# Patient Record
Sex: Female | Born: 1950 | Race: Black or African American | Hispanic: No | Marital: Married | Smoking: Never smoker
Health system: Southern US, Community
[De-identification: ages and names within clinical notes are randomized; demographics above are authoritative.]

## PROBLEM LIST (undated history)

## (undated) DIAGNOSIS — E119 Type 2 diabetes mellitus without complications: Secondary | ICD-10-CM

## (undated) DIAGNOSIS — G4730 Sleep apnea, unspecified: Secondary | ICD-10-CM

## (undated) DIAGNOSIS — I2510 Atherosclerotic heart disease of native coronary artery without angina pectoris: Secondary | ICD-10-CM

## (undated) DIAGNOSIS — B009 Herpesviral infection, unspecified: Secondary | ICD-10-CM

## (undated) DIAGNOSIS — T7840XA Allergy, unspecified, initial encounter: Secondary | ICD-10-CM

## (undated) DIAGNOSIS — G4733 Obstructive sleep apnea (adult) (pediatric): Secondary | ICD-10-CM

## (undated) DIAGNOSIS — I10 Essential (primary) hypertension: Secondary | ICD-10-CM

## (undated) DIAGNOSIS — E785 Hyperlipidemia, unspecified: Secondary | ICD-10-CM

## (undated) DIAGNOSIS — F419 Anxiety disorder, unspecified: Secondary | ICD-10-CM

## (undated) DIAGNOSIS — K219 Gastro-esophageal reflux disease without esophagitis: Secondary | ICD-10-CM

## (undated) DIAGNOSIS — J309 Allergic rhinitis, unspecified: Secondary | ICD-10-CM

## (undated) HISTORY — DX: Allergy, unspecified, initial encounter: T78.40XA

## (undated) HISTORY — PX: ANGIOPLASTY: SHX39

## (undated) HISTORY — PX: CARDIAC CATHETERIZATION: SHX172

## (undated) HISTORY — PX: ABDOMINAL HYSTERECTOMY: SHX81

## (undated) HISTORY — PX: TUBAL LIGATION: SHX77

## (undated) HISTORY — DX: Obstructive sleep apnea (adult) (pediatric): G47.33

## (undated) HISTORY — DX: Gastro-esophageal reflux disease without esophagitis: K21.9

## (undated) HISTORY — DX: Anxiety disorder, unspecified: F41.9

## (undated) HISTORY — DX: Herpesviral infection, unspecified: B00.9

## (undated) HISTORY — DX: Allergic rhinitis, unspecified: J30.9

## (undated) HISTORY — DX: Sleep apnea, unspecified: G47.30

## (undated) HISTORY — DX: Hyperlipidemia, unspecified: E78.5

## (undated) HISTORY — DX: Atherosclerotic heart disease of native coronary artery without angina pectoris: I25.10

---

## 1997-04-05 ENCOUNTER — Ambulatory Visit (HOSPITAL_COMMUNITY): Admission: RE | Admit: 1997-04-05 | Discharge: 1997-04-05 | Payer: Self-pay

## 1998-04-17 ENCOUNTER — Ambulatory Visit (HOSPITAL_COMMUNITY): Admission: RE | Admit: 1998-04-17 | Discharge: 1998-04-17 | Payer: Self-pay | Admitting: Internal Medicine

## 1998-04-17 ENCOUNTER — Encounter: Payer: Self-pay | Admitting: Internal Medicine

## 1998-06-28 ENCOUNTER — Ambulatory Visit (HOSPITAL_COMMUNITY): Admission: RE | Admit: 1998-06-28 | Discharge: 1998-06-28 | Payer: Self-pay | Admitting: Internal Medicine

## 1998-08-02 ENCOUNTER — Encounter: Payer: Self-pay | Admitting: Cardiology

## 1998-08-02 ENCOUNTER — Ambulatory Visit (HOSPITAL_COMMUNITY): Admission: RE | Admit: 1998-08-02 | Discharge: 1998-08-02 | Payer: Self-pay | Admitting: Cardiology

## 1999-04-22 ENCOUNTER — Ambulatory Visit (HOSPITAL_COMMUNITY): Admission: RE | Admit: 1999-04-22 | Discharge: 1999-04-22 | Payer: Self-pay | Admitting: Internal Medicine

## 1999-04-22 ENCOUNTER — Encounter: Payer: Self-pay | Admitting: Internal Medicine

## 1999-06-10 ENCOUNTER — Encounter: Admission: RE | Admit: 1999-06-10 | Discharge: 1999-07-16 | Payer: Self-pay | Admitting: Internal Medicine

## 1999-10-03 ENCOUNTER — Encounter (INDEPENDENT_AMBULATORY_CARE_PROVIDER_SITE_OTHER): Payer: Self-pay | Admitting: *Deleted

## 1999-10-03 ENCOUNTER — Ambulatory Visit (HOSPITAL_BASED_OUTPATIENT_CLINIC_OR_DEPARTMENT_OTHER): Admission: RE | Admit: 1999-10-03 | Discharge: 1999-10-03 | Payer: Self-pay | Admitting: General Surgery

## 2000-01-05 ENCOUNTER — Encounter: Admission: RE | Admit: 2000-01-05 | Discharge: 2000-01-05 | Payer: Self-pay | Admitting: Internal Medicine

## 2000-01-05 ENCOUNTER — Encounter: Payer: Self-pay | Admitting: Internal Medicine

## 2000-07-05 ENCOUNTER — Ambulatory Visit (HOSPITAL_COMMUNITY): Admission: RE | Admit: 2000-07-05 | Discharge: 2000-07-05 | Payer: Self-pay | Admitting: Internal Medicine

## 2000-07-05 ENCOUNTER — Encounter: Payer: Self-pay | Admitting: Internal Medicine

## 2001-07-14 ENCOUNTER — Ambulatory Visit (HOSPITAL_COMMUNITY): Admission: RE | Admit: 2001-07-14 | Discharge: 2001-07-14 | Payer: Self-pay | Admitting: Internal Medicine

## 2001-07-14 ENCOUNTER — Encounter: Payer: Self-pay | Admitting: Internal Medicine

## 2002-09-08 ENCOUNTER — Ambulatory Visit (HOSPITAL_COMMUNITY): Admission: RE | Admit: 2002-09-08 | Discharge: 2002-09-08 | Payer: Self-pay | Admitting: Internal Medicine

## 2002-09-08 ENCOUNTER — Encounter: Payer: Self-pay | Admitting: Internal Medicine

## 2002-10-19 ENCOUNTER — Ambulatory Visit (HOSPITAL_COMMUNITY): Admission: RE | Admit: 2002-10-19 | Discharge: 2002-10-19 | Payer: Self-pay | Admitting: Internal Medicine

## 2002-11-03 ENCOUNTER — Encounter: Admission: RE | Admit: 2002-11-03 | Discharge: 2002-11-03 | Payer: Self-pay | Admitting: Internal Medicine

## 2002-11-03 ENCOUNTER — Encounter: Payer: Self-pay | Admitting: Internal Medicine

## 2002-11-03 ENCOUNTER — Ambulatory Visit (HOSPITAL_COMMUNITY): Admission: RE | Admit: 2002-11-03 | Discharge: 2002-11-03 | Payer: Self-pay | Admitting: Internal Medicine

## 2002-12-07 ENCOUNTER — Encounter: Admission: RE | Admit: 2002-12-07 | Discharge: 2003-03-07 | Payer: Self-pay | Admitting: Internal Medicine

## 2003-07-20 ENCOUNTER — Emergency Department (HOSPITAL_COMMUNITY): Admission: EM | Admit: 2003-07-20 | Discharge: 2003-07-20 | Payer: Self-pay | Admitting: Emergency Medicine

## 2003-07-21 ENCOUNTER — Ambulatory Visit (HOSPITAL_COMMUNITY): Admission: RE | Admit: 2003-07-21 | Discharge: 2003-07-21 | Payer: Self-pay | Admitting: Emergency Medicine

## 2003-07-23 ENCOUNTER — Ambulatory Visit (HOSPITAL_COMMUNITY): Admission: RE | Admit: 2003-07-23 | Discharge: 2003-07-23 | Payer: Self-pay | Admitting: Emergency Medicine

## 2003-08-02 ENCOUNTER — Encounter: Admission: RE | Admit: 2003-08-02 | Discharge: 2003-10-31 | Payer: Self-pay | Admitting: Internal Medicine

## 2003-09-07 ENCOUNTER — Inpatient Hospital Stay (HOSPITAL_COMMUNITY): Admission: EM | Admit: 2003-09-07 | Discharge: 2003-09-10 | Payer: Self-pay | Admitting: Emergency Medicine

## 2003-09-23 ENCOUNTER — Emergency Department (HOSPITAL_COMMUNITY): Admission: EM | Admit: 2003-09-23 | Discharge: 2003-09-24 | Payer: Self-pay | Admitting: *Deleted

## 2003-10-30 ENCOUNTER — Ambulatory Visit (HOSPITAL_COMMUNITY): Admission: RE | Admit: 2003-10-30 | Discharge: 2003-10-30 | Payer: Self-pay | Admitting: Internal Medicine

## 2004-03-26 ENCOUNTER — Encounter: Admission: RE | Admit: 2004-03-26 | Discharge: 2004-03-26 | Payer: Self-pay | Admitting: Gastroenterology

## 2004-09-26 ENCOUNTER — Other Ambulatory Visit: Admission: RE | Admit: 2004-09-26 | Discharge: 2004-09-26 | Payer: Self-pay | Admitting: Internal Medicine

## 2004-11-10 ENCOUNTER — Ambulatory Visit (HOSPITAL_COMMUNITY): Admission: RE | Admit: 2004-11-10 | Discharge: 2004-11-10 | Payer: Self-pay | Admitting: Internal Medicine

## 2005-06-24 ENCOUNTER — Encounter: Admission: RE | Admit: 2005-06-24 | Discharge: 2005-06-24 | Payer: Self-pay | Admitting: Cardiology

## 2005-06-25 ENCOUNTER — Ambulatory Visit (HOSPITAL_COMMUNITY): Admission: RE | Admit: 2005-06-25 | Discharge: 2005-06-25 | Payer: Self-pay | Admitting: Cardiology

## 2005-11-11 ENCOUNTER — Ambulatory Visit (HOSPITAL_COMMUNITY): Admission: RE | Admit: 2005-11-11 | Discharge: 2005-11-11 | Payer: Self-pay | Admitting: Internal Medicine

## 2006-06-04 ENCOUNTER — Emergency Department (HOSPITAL_COMMUNITY): Admission: EM | Admit: 2006-06-04 | Discharge: 2006-06-04 | Payer: Self-pay | Admitting: Family Medicine

## 2006-12-30 ENCOUNTER — Ambulatory Visit (HOSPITAL_COMMUNITY): Admission: RE | Admit: 2006-12-30 | Discharge: 2006-12-30 | Payer: Self-pay | Admitting: *Deleted

## 2007-05-04 ENCOUNTER — Emergency Department (HOSPITAL_COMMUNITY): Admission: EM | Admit: 2007-05-04 | Discharge: 2007-05-04 | Payer: Self-pay | Admitting: Emergency Medicine

## 2007-12-20 ENCOUNTER — Ambulatory Visit: Payer: Self-pay | Admitting: Physical Medicine & Rehabilitation

## 2008-01-04 ENCOUNTER — Ambulatory Visit (HOSPITAL_COMMUNITY): Admission: RE | Admit: 2008-01-04 | Discharge: 2008-01-04 | Payer: Self-pay | Admitting: Internal Medicine

## 2009-01-07 ENCOUNTER — Ambulatory Visit (HOSPITAL_COMMUNITY): Admission: RE | Admit: 2009-01-07 | Discharge: 2009-01-07 | Payer: Self-pay | Admitting: Internal Medicine

## 2010-01-08 ENCOUNTER — Ambulatory Visit (HOSPITAL_COMMUNITY)
Admission: RE | Admit: 2010-01-08 | Discharge: 2010-01-08 | Payer: Self-pay | Source: Home / Self Care | Attending: *Deleted | Admitting: *Deleted

## 2010-06-27 NOTE — Cardiovascular Report (Signed)
NAME:  Victoria Jimenez, Victoria Jimenez NO.:  [id number]   MEDICAL RECORD NO.:  [id number]          PATIENT TYPE:  OIB   LOCATION:  2899                         FACILITY:  [facility]   PHYSICIAN:  Madaline Savage, M.D.DATE OF BIRTH:  1950/06/19   DATE OF PROCEDURE:  06/25/2005  DATE OF DISCHARGE:                              CARDIAC CATHETERIZATION   PROCEDURES PERFORMED:  1.  Outpatient cardiac catheterization consisting of:      1.  Selective coronary angiography by Judkins technique.      2.  Retrograde left heart catheterization.      3.  Left ventricular angiography.  2.  Non-selective aortography just above the renal arteries.  3.  Successful right femoral percutaneous femoral AngioSeal closure.   COMPLICATIONS:  None.   ENTRY SITE:  Right femoral.   DYE USED:  Omnipaque.   PATIENT PROFILE:  The patient is a very pleasant 60 year old African-  American female who is a medical patient of Dr. Madison Hickman who was  recently referred to me for chest pain.  An outpatient Cardiolite stress  test showed anterior and apical wall reversible ischemia and showed a normal  left ventricular ejection fraction.  Today the patient enters the  catheterization laboratory electively for diagnostic cardiac catheterization  to further delineate the possible cause of chest pain in view of the  abnormal Cardiolite.  The patient gave her informed consent prior to the  date of this catheterization.   RESULTS:   PRESSURES:  The left ventricular pressure was 160/10, end-diastolic pressure  18.  Central aortic pressure 160/75, mean of 110.  No aortic valve gradient  by pullback technique.   ANGIOGRAPHIC RESULTS:  The patient's left main coronary artery was very  short and superiorly oriented.  LAD coursed to the cardiac apex and gave  rise to a single diagonal branch near the origin of the first septal  perforator branch.  Diagonal LAD and septal perforator branches were all  normal.   Left circumflex coronary artery was dominant giving rise to a posterior  descending branch in the lower two-thirds of the vessel.  The first and  second OM were normal.  The PDA was normal and the left circumflex coronary  artery was normal.   The right coronary artery was non-dominant and did damp somewhat with  intubation with the 6-French diagnostic catheter.  No lesions were seen  throughout the RCA.   Left ventricular angiogram showed normal contractility of all wall segments.  No mitral regurgitation was seen.  No apical thrombus was noted.  LV EF was  60%.   Because of the patient's history of hypertension abdominal aortography was  performed just above the level of the renal arteries.  Both renal arteries  were single and normal.  No evidence of renal artery stenosis was  identified.  In addition, I was able to see the proximal and mid superior  mesenteric artery which appeared normal and infrarenal abdominal aorta was  normal as were the common iliacs.   FINAL DIAGNOSIS:  1.  Recent episodes of chest pain superimposed on a greater than 10-year  history of chest and back pain.  2.  Morbid obesity.  3.  Hypertension.   FINAL IMPRESSIONS:  The patient has a history of somewhat atypical chest  pain.  Did have an abnormal Cardiolite stress test, but showed  angiographically patent coronary arteries at today's catheterization.  Her  Protonix dose is 40 mg daily.  We will increase it for a while to 40 mg  twice a day.  We will refer her back to the excellent care of Madison Hickman, M.D., her primary care physician and I am planning to counsel the  patient in regards to weight loss.  I believe that a lot of her back pain  arises from her weight and I think some of her chest pain probably does as  well.   FINAL IMPRESSIONS:  1.  Angiographically patent coronary arteries in a left circumflex dominant      system.  2.  Normal left ventricular ejection fraction.  3.  Normal  renal arteries.  4.  Normal abdominal aorta.           ______________________________  Madaline Savage, M.D.     WHG/MEDQ  D:  06/25/2005  T:  06/25/2005  Job:  [id number]   cc:   Darius Bump, M.D.  Fax: [phone]   Cath Lab   Medical Records

## 2010-06-29 ENCOUNTER — Inpatient Hospital Stay (INDEPENDENT_AMBULATORY_CARE_PROVIDER_SITE_OTHER)
Admission: RE | Admit: 2010-06-29 | Discharge: 2010-06-29 | Disposition: A | Discharge status: Home / Self Care | Payer: Medicaid Other | Source: Ambulatory Visit | Attending: Emergency Medicine | Admitting: Emergency Medicine

## 2010-06-29 DIAGNOSIS — M779 Enthesopathy, unspecified: Secondary | ICD-10-CM

## 2010-06-29 DIAGNOSIS — M7710 Lateral epicondylitis, unspecified elbow: Secondary | ICD-10-CM

## 2011-01-02 ENCOUNTER — Other Ambulatory Visit (HOSPITAL_COMMUNITY): Payer: Self-pay | Admitting: Family Medicine

## 2011-01-02 DIAGNOSIS — Z1231 Encounter for screening mammogram for malignant neoplasm of breast: Secondary | ICD-10-CM

## 2011-02-11 ENCOUNTER — Ambulatory Visit (HOSPITAL_COMMUNITY)
Admission: RE | Admit: 2011-02-11 | Discharge: 2011-02-11 | Disposition: A | Discharge status: Home / Self Care | Payer: Self-pay | Source: Ambulatory Visit | Attending: Family Medicine | Admitting: Family Medicine

## 2011-02-11 DIAGNOSIS — Z1231 Encounter for screening mammogram for malignant neoplasm of breast: Secondary | ICD-10-CM

## 2011-03-30 ENCOUNTER — Other Ambulatory Visit: Payer: Self-pay | Admitting: Oncology

## 2011-07-15 ENCOUNTER — Other Ambulatory Visit (HOSPITAL_COMMUNITY): Payer: Self-pay | Admitting: Cardiology

## 2011-07-15 DIAGNOSIS — R0602 Shortness of breath: Secondary | ICD-10-CM

## 2011-07-21 ENCOUNTER — Encounter (HOSPITAL_COMMUNITY)
Admission: RE | Admit: 2011-07-21 | Discharge: 2011-07-21 | Disposition: A | Discharge status: Home / Self Care | Payer: Self-pay | Source: Ambulatory Visit | Attending: Cardiology | Admitting: Cardiology

## 2011-07-21 ENCOUNTER — Other Ambulatory Visit (HOSPITAL_COMMUNITY): Payer: Self-pay

## 2011-07-21 ENCOUNTER — Ambulatory Visit (HOSPITAL_COMMUNITY)
Admission: RE | Admit: 2011-07-21 | Discharge: 2011-07-21 | Disposition: A | Discharge status: Home / Self Care | Payer: Self-pay | Source: Ambulatory Visit | Attending: Cardiology | Admitting: Cardiology

## 2011-07-21 DIAGNOSIS — E119 Type 2 diabetes mellitus without complications: Secondary | ICD-10-CM | POA: Insufficient documentation

## 2011-07-21 DIAGNOSIS — R0989 Other specified symptoms and signs involving the circulatory and respiratory systems: Secondary | ICD-10-CM | POA: Insufficient documentation

## 2011-07-21 DIAGNOSIS — R0602 Shortness of breath: Secondary | ICD-10-CM

## 2011-07-21 DIAGNOSIS — E785 Hyperlipidemia, unspecified: Secondary | ICD-10-CM | POA: Insufficient documentation

## 2011-07-21 DIAGNOSIS — R0609 Other forms of dyspnea: Secondary | ICD-10-CM | POA: Insufficient documentation

## 2011-07-21 DIAGNOSIS — I319 Disease of pericardium, unspecified: Secondary | ICD-10-CM | POA: Insufficient documentation

## 2011-07-21 DIAGNOSIS — M7989 Other specified soft tissue disorders: Secondary | ICD-10-CM | POA: Insufficient documentation

## 2011-07-21 DIAGNOSIS — I10 Essential (primary) hypertension: Secondary | ICD-10-CM | POA: Insufficient documentation

## 2011-07-21 MED ORDER — REGADENOSON 0.4 MG/5ML IV SOLN
INTRAVENOUS | Status: AC
  Administered 2011-07-21: 0.4 mg via INTRAVENOUS
  Filled 2011-07-21: qty 5

## 2011-07-21 MED ORDER — REGADENOSON 0.4 MG/5ML IV SOLN
0.4000 mg | Freq: Once | INTRAVENOUS | Status: AC
  Administered 2011-07-21: 0.4 mg via INTRAVENOUS

## 2011-07-21 NOTE — Progress Notes (Signed)
  Echocardiogram 2D Echocardiogram has been performed.  Cathie Beams Deneen 07/21/2011, 1:02 PM

## 2011-07-22 ENCOUNTER — Encounter (HOSPITAL_COMMUNITY)
Admission: RE | Admit: 2011-07-22 | Discharge: 2011-07-22 | Disposition: A | Discharge status: Home / Self Care | Payer: Self-pay | Source: Ambulatory Visit | Attending: Cardiology | Admitting: Cardiology

## 2011-07-22 DIAGNOSIS — R0602 Shortness of breath: Secondary | ICD-10-CM | POA: Insufficient documentation

## 2011-07-22 MED ORDER — TECHNETIUM TC 99M TETROFOSMIN IV KIT
30.0000 | PACK | Freq: Once | INTRAVENOUS | Status: AC | PRN
  Administered 2011-07-22: 30 via INTRAVENOUS

## 2011-07-22 MED ORDER — TECHNETIUM TC 99M TETROFOSMIN IV KIT: 30.0000 | PACK | Freq: Once | INTRAVENOUS | Status: AC | PRN

## 2011-08-13 ENCOUNTER — Other Ambulatory Visit: Payer: Self-pay | Admitting: Oncology

## 2011-08-24 ENCOUNTER — Ambulatory Visit (HOSPITAL_COMMUNITY)
Admission: RE | Admit: 2011-08-24 | Discharge: 2011-08-24 | Disposition: A | Discharge status: Home / Self Care | Payer: Self-pay | Source: Ambulatory Visit | Attending: Family Medicine | Admitting: Family Medicine

## 2011-08-24 ENCOUNTER — Other Ambulatory Visit (HOSPITAL_COMMUNITY): Payer: Self-pay | Admitting: Family Medicine

## 2011-08-24 DIAGNOSIS — M5137 Other intervertebral disc degeneration, lumbosacral region: Secondary | ICD-10-CM | POA: Insufficient documentation

## 2011-08-24 DIAGNOSIS — M545 Low back pain, unspecified: Secondary | ICD-10-CM | POA: Insufficient documentation

## 2011-08-24 DIAGNOSIS — R52 Pain, unspecified: Secondary | ICD-10-CM | POA: Insufficient documentation

## 2011-08-24 DIAGNOSIS — M51379 Other intervertebral disc degeneration, lumbosacral region without mention of lumbar back pain or lower extremity pain: Secondary | ICD-10-CM | POA: Insufficient documentation

## 2012-01-06 ENCOUNTER — Other Ambulatory Visit (HOSPITAL_COMMUNITY): Payer: Self-pay | Admitting: Family Medicine

## 2012-01-11 ENCOUNTER — Encounter (HOSPITAL_COMMUNITY): Payer: Self-pay | Admitting: Cardiology

## 2012-01-11 ENCOUNTER — Emergency Department (HOSPITAL_COMMUNITY): Payer: Self-pay

## 2012-01-11 ENCOUNTER — Encounter (HOSPITAL_COMMUNITY): Payer: Self-pay

## 2012-01-11 ENCOUNTER — Emergency Department (INDEPENDENT_AMBULATORY_CARE_PROVIDER_SITE_OTHER)
Admission: EM | Admit: 2012-01-11 | Discharge: 2012-01-11 | Disposition: A | Discharge status: Nursing Home (Includes ICF) | Payer: No Typology Code available for payment source | Source: Home / Self Care | Attending: Emergency Medicine | Admitting: Emergency Medicine

## 2012-01-11 ENCOUNTER — Emergency Department (HOSPITAL_COMMUNITY)
Admission: EM | Admit: 2012-01-11 | Discharge: 2012-01-11 | Disposition: A | Discharge status: Home / Self Care | Payer: Self-pay | Attending: Emergency Medicine | Admitting: Emergency Medicine

## 2012-01-11 DIAGNOSIS — R079 Chest pain, unspecified: Secondary | ICD-10-CM

## 2012-01-11 DIAGNOSIS — R0789 Other chest pain: Secondary | ICD-10-CM | POA: Insufficient documentation

## 2012-01-11 DIAGNOSIS — R61 Generalized hyperhidrosis: Secondary | ICD-10-CM | POA: Insufficient documentation

## 2012-01-11 DIAGNOSIS — Z951 Presence of aortocoronary bypass graft: Secondary | ICD-10-CM | POA: Insufficient documentation

## 2012-01-11 DIAGNOSIS — E785 Hyperlipidemia, unspecified: Secondary | ICD-10-CM | POA: Insufficient documentation

## 2012-01-11 DIAGNOSIS — G4733 Obstructive sleep apnea (adult) (pediatric): Secondary | ICD-10-CM | POA: Insufficient documentation

## 2012-01-11 DIAGNOSIS — E119 Type 2 diabetes mellitus without complications: Secondary | ICD-10-CM | POA: Insufficient documentation

## 2012-01-11 DIAGNOSIS — K219 Gastro-esophageal reflux disease without esophagitis: Secondary | ICD-10-CM | POA: Insufficient documentation

## 2012-01-11 DIAGNOSIS — R42 Dizziness and giddiness: Secondary | ICD-10-CM | POA: Insufficient documentation

## 2012-01-11 DIAGNOSIS — R0602 Shortness of breath: Secondary | ICD-10-CM | POA: Insufficient documentation

## 2012-01-11 DIAGNOSIS — Z79899 Other long term (current) drug therapy: Secondary | ICD-10-CM | POA: Insufficient documentation

## 2012-01-11 DIAGNOSIS — I2510 Atherosclerotic heart disease of native coronary artery without angina pectoris: Secondary | ICD-10-CM | POA: Insufficient documentation

## 2012-01-11 DIAGNOSIS — Z794 Long term (current) use of insulin: Secondary | ICD-10-CM | POA: Insufficient documentation

## 2012-01-11 DIAGNOSIS — I10 Essential (primary) hypertension: Secondary | ICD-10-CM | POA: Insufficient documentation

## 2012-01-11 HISTORY — DX: Type 2 diabetes mellitus without complications: E11.9

## 2012-01-11 HISTORY — DX: Essential (primary) hypertension: I10

## 2012-01-11 LAB — BASIC METABOLIC PANEL
Calcium: 9.6 mg/dL (ref 8.4–10.5)
Creatinine, Ser: 0.81 mg/dL (ref 0.50–1.10)
GFR calc non Af Amer: 78 mL/min — ABNORMAL LOW (ref >90)
Sodium: 142 mEq/L (ref 135–145)

## 2012-01-11 LAB — TROPONIN I: Troponin I: <0.3 ng/mL (ref <0.30)

## 2012-01-11 LAB — CBC WITH DIFFERENTIAL/PLATELET
Basophils Absolute: 0 10*3/uL (ref 0.0–0.1)
Eosinophils Absolute: 0.3 10*3/uL (ref 0.0–0.7)
Eosinophils Relative: 3 % (ref 0–5)
HCT: 39.6 % (ref 36.0–46.0)
MCH: 26.8 pg (ref 26.0–34.0)
MCHC: 33.1 g/dL (ref 30.0–36.0)
MCV: 81 fL (ref 78.0–100.0)
Monocytes Absolute: 0.4 10*3/uL (ref 0.1–1.0)
Platelets: 303 10*3/uL (ref 150–400)
RDW: 14.2 % (ref 11.5–15.5)
WBC: 7.4 10*3/uL (ref 4.0–10.5)

## 2012-01-11 LAB — PRO B NATRIURETIC PEPTIDE: Pro B Natriuretic peptide (BNP): 12.1 pg/mL (ref 0–125)

## 2012-01-11 MED ORDER — ONDANSETRON HCL 4 MG/2ML IJ SOLN
4.0000 mg | Freq: Once | INTRAMUSCULAR | Status: AC
  Administered 2012-01-11: 4 mg via INTRAVENOUS
  Filled 2012-01-11: qty 2

## 2012-01-11 MED ORDER — LANSOPRAZOLE 30 MG PO CPDR: 30.0000 mg | DELAYED_RELEASE_CAPSULE | Freq: Every day | ORAL | Status: DC

## 2012-01-11 MED ORDER — NITROGLYCERIN 0.4 MG SL SUBL
SUBLINGUAL_TABLET | SUBLINGUAL | Status: AC
  Filled 2012-01-11: qty 25

## 2012-01-11 MED ORDER — ASPIRIN 81 MG PO CHEW
324.0000 mg | CHEWABLE_TABLET | Freq: Once | ORAL | Status: AC
  Administered 2012-01-11: 324 mg via ORAL

## 2012-01-11 MED ORDER — ASPIRIN 81 MG PO CHEW
CHEWABLE_TABLET | ORAL | Status: AC
  Filled 2012-01-11: qty 4

## 2012-01-11 MED ORDER — MORPHINE SULFATE 4 MG/ML IJ SOLN
4.0000 mg | Freq: Once | INTRAMUSCULAR | Status: AC
  Administered 2012-01-11: 4 mg via INTRAVENOUS
  Filled 2012-01-11: qty 1

## 2012-01-11 MED ORDER — NITROGLYCERIN 0.4 MG SL SUBL
0.4000 mg | SUBLINGUAL_TABLET | SUBLINGUAL | Status: AC | PRN
  Administered 2012-01-11: 0.4 mg via SUBLINGUAL

## 2012-01-11 MED ORDER — NITROGLYCERIN 0.4 MG SL SUBL: 0.4000 mg | SUBLINGUAL_TABLET | SUBLINGUAL | Status: DC | PRN

## 2012-01-11 MED ORDER — SODIUM CHLORIDE 0.9 % IV SOLN
INTRAVENOUS | Status: DC
  Administered 2012-01-11: 12:00:00 via INTRAVENOUS

## 2012-01-11 NOTE — ED Notes (Signed)
Denies cp at this time.

## 2012-01-11 NOTE — Consult Note (Signed)
Admit date: 01/11/2012 Referring Physician : Dr. Lorenz Coaster Primary Physician FULP, CAMMIE, MD Primary Cardiologist  : Dr. Katrinka Blazing Reason for Consultation  : Chest pain  HPI: 61 year old female with a history of diabetes, hypertension, morbid obesity, obstructive sleep apnea, who presented to the emergency department today with chest pain, substernal in location, worse with laying down, improved with activity which started last Wednesday and has progressively gotten worse to the point where last night she states that she only got 2 hours of sleep. She also states that she has been having left posterior neck pain/occipital discomfort. The chest pain is characterized as sharp, throbbing, constant in duration. She also notes that she has been experiencing increasing acidity/acid taste in her mouth. She has been taking proton pump inhibitor for 20 years she states. She has not had cholecystectomy.  After taking nitroglycerin, she states that sharp pain subsided and she is currently feeling much better. She is surrounded by many family members in the room.  She denies any recent fevers, night sweats, cough, chills, rashes, new joint pain, dysphagia. She does not think that her pain changes with food. She has not had any emesis. It is not associated with shortness of breath. No syncope. No palpitations.  Heart catheterization in 2007 was reviewed, performed by Dr. Elsie Lincoln, and was reassuring with no evidence of coronary artery disease. In that report, he did note that she had repeated bouts of chest discomfort over several years and may have been associated with back pain/obesity.  Just recently, June of 2013, she ended up having a nuclear stress test done which was low risk, no ischemia, normal EF. Reassuring. Her EKG demonstrates nonspecific T-wave changes especially in the anterolateral leads which are old. She does not show any evidence of J-point elevation.    PMH:   Past Medical History  Diagnosis Date    . Hypertension   . Diabetes mellitus without complication     PSH:   Past Surgical History  Procedure Date  . Abdominal hysterectomy    Allergies:  Review of patient's allergies indicates no known allergies. Prior to Admit Meds:   Prior to Admission medications   Medication Sig Start Date End Date Taking? Authorizing Provider  amlodipine-atorvastatin (CADUET) 10-10 MG per tablet Take 1 tablet by mouth daily.   Yes Historical Provider, MD  aspirin EC 81 MG tablet Take 81 mg by mouth daily.   Yes Historical Provider, MD  Canagliflozin (INVOKANA) 100 MG TABS Take 1 tablet by mouth daily.   Yes Historical Provider, MD  CINNAMON PO Take 2 tablets by mouth 2 (two) times daily.   Yes Historical Provider, MD  fish oil-omega-3 fatty acids 1000 MG capsule Take 1 g by mouth 2 (two) times daily.   Yes Historical Provider, MD  Flaxseed, Linseed, (FLAX SEED OIL PO) Take 1 capsule by mouth 2 (two) times daily.   Yes Historical Provider, MD  insulin aspart protamine-insulin aspart (NOVOLOG 70/30) (70-30) 100 UNIT/ML injection Inject 70 Units into the skin 2 (two) times daily with a meal.   Yes Historical Provider, MD  losartan (COZAAR) 100 MG tablet Take 100 mg by mouth daily.   Yes Historical Provider, MD  metFORMIN (GLUCOPHAGE) 500 MG tablet Take 500 mg by mouth 2 (two) times daily with a meal.   Yes Historical Provider, MD  pantoprazole (PROTONIX) 20 MG tablet Take 20 mg by mouth daily.   Yes Historical Provider, MD    Fam HX:    Family History  Problem Relation  Age of Onset  . Coronary artery disease Father   . Lung cancer Mother   . Diabetes Mother     Social HX:    History   Social History  . Marital Status: Married    Spouse Name: N/A    Number of Children: N/A  . Years of Education: N/A   Occupational History  . Not on file.   Social History Main Topics  . Smoking status: Never Smoker   . Smokeless tobacco: Not on file  . Alcohol Use: No  . Drug Use: No  . Sexually Active:  Not on file   Other Topics Concern  . Not on file   Social History Narrative  . No narrative on file     ROS:  All 11 ROS were addressed and are negative except what is stated in the HPI  Physical Exam: Blood pressure 124/40, pulse 79, temperature 97.7 F (36.5 C), temperature source Oral, resp. rate 16, SpO2 100.00%.    General: Well developed, well nourished, in no acute distress Head: Eyes PERRLA, No xanthomas.   Normal cephalic and atramatic  Lungs:   Clear bilaterally to auscultation and percussion. Normal respiratory effort. No wheezes, no rales. Heart:   HRRR S1 S2 Pulses are 2+ & equal. No appreciable murmurs. No rubs.            No carotid bruit. No JVD, although difficult to assess with thick neck.  No abdominal bruits.  Abdomen: Bowel sounds are positive, abdomen soft and non-tender without masses. No hepatosplenomegaly. Obese Msk:  Back normal. Normal strength and tone for age. Extremities:   No clubbing, cyanosis or edema.  DP +1 Neuro: Alert and oriented X 3, non-focal, MAE x 4 GU: Deferred Rectal: Deferred Psych:  Good affect, responds appropriately    Labs:   Lab Results  Component Value Date   WBC 7.4 01/11/2012   HGB 13.1 01/11/2012   HCT 39.6 01/11/2012   MCV 81.0 01/11/2012   PLT 303 01/11/2012     Lab 01/11/12 1321  NA 142  K 3.4*  CL 105  CO2 27  BUN 19  CREATININE 0.81  CALCIUM 9.6  PROT --  BILITOT --  ALKPHOS --  ALT --  AST --  GLUCOSE 81   Lab Results  Component Value Date   TROPONINI <0.30 01/11/2012        Radiology:  Dg Chest 2 View  01/11/2012  *RADIOLOGY REPORT*  Clinical Data: Chest pain and shortness of breath.  CHEST - 2 VIEW  Comparison: 06/24/2005  Findings: Midline trachea.  Borderline cardiomegaly. Mediastinal contours otherwise within normal limits.  No pleural effusion or pneumothorax.  Mild interstitial thickening, similar to on the prior exam. No lobar consolidation.  IMPRESSION: Borderline cardiomegaly and similar  interstitial thickening. Favored to represent the sequelae of chronic bronchitis/smoking. Mild pulmonary venous congestion could look similar.  No overt congestive failure.   Original Report Authenticated By: Jeronimo Greaves, M.D.    Personally viewed.  EKG:  Normal sinus rhythm, nonspecific ST-T wave changes especially in the lateral leads. No change from prior EKG. Personally viewed.   ASSESSMENT/PLAN:   62 year old female with morbid obesity, diabetes, obstructive sleep apnea, GERD, prior history of chest pain, nonsmoker with chest discomfort-possible GERD exacerbation.  Chest pain-it is likely that given her increased acidic taste in her mouth over the past several days that she did not have prior to her chest discomfort as well as the positional change with her chest discomfort,  laying down makes worse, GERD is the most likely cause for her constant pain. She has been on Protonix 20 mg a day. He would not be unreasonable to increase this to 40 mg a day or to change to Prevacid. Dr. Jillyn Hidden, primary physician, may make ultimate choice. Her troponin is normal. I asked to obtain a second troponin. I explained to her that constant pain since Wednesday but had a normal troponin is very reassuring. EKG is unchanged. I entertained the thought of pericarditis however she does not have any associated fevers, EKG changes. Also, she has a reassuring stress test in June of 2013. She's had prior reassuring catheterizations. I would feel comfortable allowing her to be discharged if second troponin is normal. She is comfortable with this plan as well. I would encourage more aggressive GERD management. I will set her up with close followup in the clinic.  Obesity-continue to encourage weight loss.  Diabetes-not addressed today. Continue with current medications.  I have discussed plan with the ER physician.  Donato Schultz, MD  01/11/2012  4:01 PM

## 2012-01-11 NOTE — ED Notes (Signed)
Pt states she had constant, throbbing chest pain x5 days. Pt states she couldn't sleep due to cp. Pt states she has had cp in past, nothing like current episode. Pt denies any chest pain presently. NAD noted. Pt denies any other concerning symptoms. Pain was worse with movement of left arm. NSR on monitor.

## 2012-01-11 NOTE — ED Provider Notes (Signed)
Chief Complaint  Patient presents with  . Neck Pain  . Arm Pain    left arm    History of Present Illness:   Victoria Jimenez is a 61 year old female with a history of high blood pressure, diabetes, hyperlipidemia, and known coronary artery disease. She presents today with a six-day history of left lateral chest pain radiating into her neck and down her left arm. The pain is constant and throbbing. It's worse when she lies down at night and she's been unable to sleep. It seems to be getting worse. It's not pleuritic. It has been associated with some sweats and dizziness, but no shortness of breath or nausea. She denies any fever, chills, wheezing, or shortness of breath. She has had no palpitations, or dizziness. She has had some cough and ankle swelling. She denies any abdominal pain, nausea, or vomiting. She's had 2 heart caths in the past done by Dr. Daisy Floro and Dr. Elsie Lincoln. Dr. Verdis Prime is her current cardiologist. She has had one angioplasty.  Review of Systems:  Other than noted above, the patient denies any of the following symptoms. Systemic:  No fever, chills, sweats, or fatigue. ENT:  No nasal congestion, rhinorrhea, or sore throat. Pulmonary:  No cough, wheezing, shortness of breath, sputum production, hemoptysis. Cardiac:  No palpitations, rapid heartbeat, dizziness, presyncope or syncope. GI:  No abdominal pain, heartburn, nausea, or vomiting. Ext:  No leg pain or swelling.  PMFSH:  Past medical history, family history, social history, meds, and allergies were reviewed and updated as needed.   Physical Exam:   Vital signs:  BP 145/67  Pulse 83  Temp 98.3 F (36.8 C)  Resp 20  SpO2 100% Gen:  Alert, oriented, in no distress, skin warm and dry. Eye:  PERRL, lids and conjunctivas normal.  Sclera non-icteric. ENT:  Mucous membranes moist, pharynx clear. Neck:  Supple, no adenopathy or tenderness.  No JVD. Lungs:  Clear to auscultation, no wheezes, rales or rhonchi.  No  respiratory distress. Heart:  Regular rhythm.  No gallops, murmers, clicks or rubs. Chest:  No chest wall tenderness. Abdomen:  Soft, nontender, no organomegaly or mass.  Bowel sounds normal.  No pulsatile abdominal mass or bruit. Ext:  No edema.  No calf tenderness and Homann's sign negative.  Pulses full and equal. Skin:  Warm and dry.  No rash.  EKG:   Date: 01/11/2012  Rate: 80  Rhythm: normal sinus rhythm  QRS Axis: normal  Intervals: normal  ST/T Wave abnormalities: nonspecific T wave changes  Conduction Disutrbances:none  Narrative Interpretation: Normal sinus rhythm, ST and T wave abnormalities, consider lateral ischemia, abnormal EKG.  Old EKG Reviewed: none available   Medications given in [facility]:  She was placed on a monitor, given nasal O2, and an IV was started with normal saline at 50 mL per hour. She was given aspirin 325 mg by mouth and nitroglycerin 0.4 mg sublingually.  Assessment:  The encounter diagnosis was Chest pain.  Plan:   1.  The following meds were prescribed:   New Prescriptions   No medications on file   2.  The patient was transferred to the emergency department via [facility] in stable condition.   Reuben Likes, MD 01/11/12 [phone]

## 2012-01-11 NOTE — ED Notes (Signed)
Pt c/o pain that's starts on left side of chest/axilla that radiates towards left side of neck and left arm since Wednesday.... Sx include: light headed, edema of legs... Describes pain as a "constant throbbing" pain that's progressively getting worse.... Denies: fevers, nauseas, vomiting, diarrhea, SOB, blurry vision, headaches.

## 2012-01-11 NOTE — ED Provider Notes (Signed)
History     CSN: [id number]  Arrival date & time 01/11/12  1209   First MD Initiated Contact with Patient 01/11/12 1251      Chief Complaint  Patient presents with  . Chest Pain    (Consider location/radiation/quality/duration/timing/severity/associated sxs/prior treatment) HPI  61 year old female with hx of HTN, DM, HLD, and known CAD presents with six-day hx of left lateral CP.  Pain is gradual onset, radiates to neck and down to L arm, constant, throbbing.  Pain worsen when she lies down at night.  There has been associated diaphoresis and dizziness without nausea or SOB.  Pt denies fever, chills, SOB, wheezing, abd pain, n/v/d.  Has had 2 cardiac cath performed in the past by Dr. Daisy Floro and Dr. Elsie Lincoln.  Cardiologist is Dr. Verdis Prime.  Pt was seen at Urgent Care today.  Was given two SL nitro and 325 ASA which resolves her pain.  She is transfer to ER for further care.  Pt currently reports some of her discomfort has returned.  Pressure sensation to her L upper chest radiates to L side of neck and down L arm, rate as 3/10.      Past Medical History  Diagnosis Date  . Hypertension   . Diabetes mellitus without complication     Past Surgical History  Procedure Date  . Abdominal hysterectomy     No family history on file.  History  Substance Use Topics  . Smoking status: Not on file  . Smokeless tobacco: Not on file  . Alcohol Use:     OB History    Grav Para Term Preterm Abortions TAB SAB Ect Mult Living                  Review of Systems  All other systems reviewed and are negative.    Allergies  Review of patient's allergies indicates no known allergies.  Home Medications   Current Outpatient Rx  Name  Route  Sig  Dispense  Refill  . AMLODIPINE-ATORVASTATIN 10-10 MG PO TABS   Oral   Take 1 tablet by mouth daily.         . INSULIN ASPART PROT & ASPART (70-30) 100 UNIT/ML [place] SUSP   Subcutaneous   Inject 70 Units into the skin 2 (two) times daily  with a meal.         . COZAAR PO   Oral   Take by mouth.         . METFORMIN HCL 500 MG PO TABS   Oral   Take 500 mg by mouth 2 (two) times daily with a meal.         . PANTOPRAZOLE SODIUM 20 MG PO TBEC   Oral   Take 20 mg by mouth daily.           BP 133/49  Pulse 74  Temp 97.7 F (36.5 C) (Oral)  Resp 16  SpO2 100%  Physical Exam  Nursing note and vitals reviewed. Constitutional: She appears well-developed and well-nourished. No distress.       Awake, alert, nontoxic appearance  HENT:  Head: Atraumatic.  Eyes: Conjunctivae normal and EOM are normal. Pupils are equal, round, and reactive to light. Right eye exhibits no discharge. Left eye exhibits no discharge.  Neck: Neck supple.  Cardiovascular: Normal rate and regular rhythm.   Pulmonary/Chest: Effort normal. No respiratory distress. She exhibits no tenderness.  Abdominal: Soft. There is no tenderness. There is no rebound.  Musculoskeletal:  She exhibits no edema and no tenderness.       ROM appears intact, no obvious focal weakness  Neurological:       Mental status and motor strength appears intact  Skin: No rash noted.  Psychiatric: She has a normal mood and affect.    ED Course  Procedures (including critical care time)   Labs Reviewed  CBC WITH DIFFERENTIAL  BASIC METABOLIC PANEL  TROPONIN I   No results found.   No diagnosis found.  Results for orders placed during the hospital encounter of 01/11/12  CBC WITH DIFFERENTIAL      Component Value Range   WBC 7.4  4.0 - 10.5 K/uL   RBC 4.89  3.87 - 5.11 MIL/uL   Hemoglobin 13.1  12.0 - 15.0 g/dL   HCT 16.1  09.6 - 04.5 %   MCV 81.0  78.0 - 100.0 fL   MCH 26.8  26.0 - 34.0 pg   MCHC 33.1  30.0 - 36.0 g/dL   RDW 40.9  81.1 - 91.4 %   Platelets 303  150 - 400 K/uL   Neutrophils Relative 53  43 - 77 %   Neutro Abs 3.9  1.7 - 7.7 K/uL   Lymphocytes Relative 39  12 - 46 %   Lymphs Abs 2.9  0.7 - 4.0 K/uL   Monocytes Relative 5  3 - 12 %    Monocytes Absolute 0.4  0.1 - 1.0 K/uL   Eosinophils Relative 3  0 - 5 %   Eosinophils Absolute 0.3  0.0 - 0.7 K/uL   Basophils Relative 0  0 - 1 %   Basophils Absolute 0.0  0.0 - 0.1 K/uL  BASIC METABOLIC PANEL      Component Value Range   Sodium 142  135 - 145 mEq/L   Potassium 3.4 (*) 3.5 - 5.1 mEq/L   Chloride 105  96 - 112 mEq/L   CO2 27  19 - 32 mEq/L   Glucose, Bld 81  70 - 99 mg/dL   BUN 19  6 - 23 mg/dL   Creatinine, Ser 7.82  0.50 - 1.10 mg/dL   Calcium 9.6  8.4 - 95.6 mg/dL   GFR calc non Af Amer 78 (*) >90 mL/min   GFR calc Af Amer >90  >90 mL/min  TROPONIN I      Component Value Range   Troponin I <0.30  <0.30 ng/mL   Dg Chest 2 View  01/11/2012  *RADIOLOGY REPORT*  Clinical Data: Chest pain and shortness of breath.  CHEST - 2 VIEW  Comparison: 06/24/2005  Findings: Midline trachea.  Borderline cardiomegaly. Mediastinal contours otherwise within normal limits.  No pleural effusion or pneumothorax.  Mild interstitial thickening, similar to on the prior exam. No lobar consolidation.  IMPRESSION: Borderline cardiomegaly and similar interstitial thickening. Favored to represent the sequelae of chronic bronchitis/smoking. Mild pulmonary venous congestion could look similar.  No overt congestive failure.   Original Report Authenticated By: Jeronimo Greaves, M.D.     1. Chest pain  MDM  Pt presents with chest discomfort x 6 days, worsening with laying down and improves with sitting up.  She has risk factors for CAD and has had stenting in the past.  Her pain improves with SL nitro and ASA initially.  She reports pain return but less severe while in ER.  I gave pt morphine and another dose of SL nitro which she notice moderate improvement.  Currently CP free.  ECG, CXR, and trop  unremarkable.  Care discussed with my attending.  Will consult her cardiologist for further care.     3:05 PM i have consulted with [facility] Dr. Thelma Comp, who agrees to see pt in ED for further  management.  He also request a delta troponin which i will order.  Pt made aware of plan.    4:09 PM No significant changes in delta troponin.  Cardiologist have evaluated pt and felt that her sxs is less likely cardiac related.  He recommend prevacid at discharge and for pt to f/u with her PCP for further care.  Pt voice understanding and agrees with plan.    BP 141/55  Pulse 75  Temp 97.7 F (36.5 C) (Oral)  Resp 14  SpO2 96%  I have reviewed nursing notes and vital signs. I personally reviewed the imaging tests through PACS system  I reviewed available ER/hospitalization records thought the EMR     Fayrene Helper, [place] 01/11/12 1610

## 2012-01-11 NOTE — ED Notes (Signed)
Report given to [facility]  

## 2012-01-11 NOTE — ED Notes (Signed)
Per [facility]- pt c/o left sided chest pain radiating into left neck x6 days. 20g IV LFA. Pt received 2sl nitro and 325 asa, which completely resolved pain. Denies sob, dizziness, diaphoresis.

## 2012-01-11 NOTE — ED Notes (Signed)
Patient complains of left side of neck has been hurting as well as left arm  For almost 6 days.  Also states has no chest pain but some discomfort to left side of chest area.

## 2012-01-12 NOTE — ED Provider Notes (Signed)
Medical screening examination/treatment/procedure(s) were conducted as a shared visit with non-physician practitioner(s) and myself.  I personally evaluated the patient during the encounter   Loren Racer, MD 01/12/12 0830

## 2012-01-13 ENCOUNTER — Other Ambulatory Visit (HOSPITAL_COMMUNITY): Payer: Self-pay | Admitting: Family Medicine

## 2012-01-13 DIAGNOSIS — Z1231 Encounter for screening mammogram for malignant neoplasm of breast: Secondary | ICD-10-CM

## 2012-02-15 ENCOUNTER — Ambulatory Visit (HOSPITAL_COMMUNITY)
Admission: RE | Admit: 2012-02-15 | Discharge: 2012-02-15 | Disposition: A | Discharge status: Home / Self Care | Payer: No Typology Code available for payment source | Source: Ambulatory Visit | Attending: Family Medicine | Admitting: Family Medicine

## 2012-02-15 DIAGNOSIS — Z1231 Encounter for screening mammogram for malignant neoplasm of breast: Secondary | ICD-10-CM

## 2013-01-12 ENCOUNTER — Other Ambulatory Visit (HOSPITAL_COMMUNITY): Payer: Self-pay | Admitting: Family Medicine

## 2013-01-23 ENCOUNTER — Other Ambulatory Visit (HOSPITAL_COMMUNITY): Payer: Self-pay | Admitting: Family Medicine

## 2013-01-23 DIAGNOSIS — Z1231 Encounter for screening mammogram for malignant neoplasm of breast: Secondary | ICD-10-CM

## 2013-01-24 DIAGNOSIS — I10 Essential (primary) hypertension: Secondary | ICD-10-CM | POA: Insufficient documentation

## 2013-01-24 DIAGNOSIS — E119 Type 2 diabetes mellitus without complications: Secondary | ICD-10-CM

## 2013-01-24 DIAGNOSIS — Z794 Long term (current) use of insulin: Secondary | ICD-10-CM

## 2013-01-24 DIAGNOSIS — IMO0001 Reserved for inherently not codable concepts without codable children: Secondary | ICD-10-CM | POA: Insufficient documentation

## 2013-01-25 ENCOUNTER — Encounter: Payer: Self-pay | Admitting: General Surgery

## 2013-01-25 ENCOUNTER — Encounter: Payer: Self-pay | Admitting: Cardiology

## 2013-01-25 ENCOUNTER — Ambulatory Visit (INDEPENDENT_AMBULATORY_CARE_PROVIDER_SITE_OTHER): Payer: Self-pay | Admitting: Cardiology

## 2013-01-25 VITALS — BP 110/70 | HR 76 | Ht 66.0 in | Wt 250.0 lb

## 2013-01-25 DIAGNOSIS — E669 Obesity, unspecified: Secondary | ICD-10-CM | POA: Insufficient documentation

## 2013-01-25 DIAGNOSIS — G4733 Obstructive sleep apnea (adult) (pediatric): Secondary | ICD-10-CM

## 2013-01-25 DIAGNOSIS — Z6841 Body Mass Index (BMI) 40.0 and over, adult: Secondary | ICD-10-CM

## 2013-01-25 DIAGNOSIS — I10 Essential (primary) hypertension: Secondary | ICD-10-CM

## 2013-01-25 NOTE — Progress Notes (Signed)
[address] [place], [place]  [address] Phone: [phone] Fax:  [phone]  Date:  01/25/2013   ID:  Victoria Jimenez, DOB 07-04-1950, MRN [id number]  PCP:  Cain Saupe, MD  Sleep Medicine:  Armanda Magic, MD   History of Present Illness: Victoria Jimenez is a 62 y.o. female with a history of OSA, HTN and obesity who presents today for followup.  She is doing well with her CPAP therapy.  She tolerates her full face mask and feels the pressure is adequate.  She feels rested in the am and has no daytime sleepiness.  She tries to walk for exercise.   Wt Readings from Last 3 Encounters:  01/25/13 250 lb (113.399 kg)  01/25/13 252 lb 6.4 oz (114.488 kg)     Past Medical History  Diagnosis Date  . Hypertension   . Diabetes mellitus without complication     Type II  . Hyperlipidemia   . Herpes simplex   . GERD (gastroesophageal reflux disease)   . Allergic rhinitis   . OSA (obstructive sleep apnea)     on CPAP  . Coronary artery disease     Treated by Angioplasty    Current Outpatient Prescriptions  Medication Sig Dispense Refill  . acetaminophen (TYLENOL) 325 MG tablet Take 650 mg by mouth every 6 (six) hours as needed.      Marland Kitchen amlodipine-atorvastatin (CADUET) 10-10 MG per tablet Take 1 tablet by mouth daily.      Marland Kitchen aspirin EC 81 MG tablet Take 81 mg by mouth daily.      . Canagliflozin (INVOKANA) 300 MG TABS Take 300 mg by mouth daily.      . cetirizine (ZYRTEC) 10 MG tablet Take 10 mg by mouth daily.      Marland Kitchen CRANBERRY PO Take by mouth daily.      . insulin aspart protamine-insulin aspart (NOVOLOG 70/30) (70-30) 100 UNIT/ML injection Inject 50 Units into the skin 2 (two) times daily with a meal.       . losartan (COZAAR) 100 MG tablet Take 100 mg by mouth daily.      . metFORMIN (GLUCOPHAGE) 500 MG tablet Take 500 mg by mouth 2 (two) times daily with a meal.      . pantoprazole (PROTONIX) 40 MG tablet Take 40 mg by mouth daily.      . traMADol (ULTRAM)  50 MG tablet Take by mouth every 6 (six) hours as needed.      Marland Kitchen CINNAMON PO Take 2 tablets by mouth 2 (two) times daily.      . fish oil-omega-3 fatty acids 1000 MG capsule Take 1 g by mouth 2 (two) times daily.      . Flaxseed, Linseed, (FLAX SEED OIL PO) Take 1 capsule by mouth 2 (two) times daily.       No current facility-administered medications for this visit.    Allergies:   No Known Allergies  Social History:  The patient  reports that she has never smoked. She does not have any smokeless tobacco history on file. She reports that she drinks alcohol. She reports that she does not use illicit drugs.   Family History:  The patient's family history includes Coronary artery disease in her father; Diabetes in her mother; Lung cancer in her mother.   ROS:  Please see the history of present illness.      All other systems reviewed and negative.   PHYSICAL EXAM: VS:  BP 110/70  Pulse 76  Ht 5\' 6"  (1.676 m)  Wt 250 lb (113.399 kg)  BMI 40.37 kg/m2 Well nourished, well developed, in no acute distress HEENT: normal Neck: no JVD Cardiac:  normal S1, S2; RRR; no murmur Lungs:  clear to auscultation bilaterally, no wheezing, rhonchi or rales Abd: soft, nontender, no hepatomegaly Ext: no edema Skin: warm and dry Neuro:  CNs 2-12 intact, no focal abnormalities noted       ASSESSMENT AND PLAN:  1. OSA on CPAP and tolerating well - her download today showed an AHI of 0.1/hr and 81% compliance in using more than 4 hours nightly 2. HTN - well controlled  - continue Amlodipine/Losartan 3. Obesity - encouraged her to continue walking as her leg pain tolerates.  Followup with me in 6 months  Signed, Armanda Magic, MD 01/25/2013 10:07 AM

## 2013-01-25 NOTE — Patient Instructions (Signed)
Your physician recommends that you continue on your current medications as directed. Please refer to the Current Medication list given to you today.  Your physician wants you to follow-up in: 6 Months with Dr Turner You will receive a reminder letter in the mail two months in advance. If you don't receive a letter, please call our office to schedule the follow-up appointment.  

## 2013-02-27 ENCOUNTER — Ambulatory Visit (HOSPITAL_COMMUNITY)
Admission: RE | Admit: 2013-02-27 | Discharge: 2013-02-27 | Disposition: A | Discharge status: Home / Self Care | Payer: PRIVATE HEALTH INSURANCE | Source: Ambulatory Visit | Attending: Family Medicine | Admitting: Family Medicine

## 2013-02-27 DIAGNOSIS — Z1231 Encounter for screening mammogram for malignant neoplasm of breast: Secondary | ICD-10-CM

## 2013-03-01 ENCOUNTER — Encounter: Payer: Self-pay | Admitting: Cardiology

## 2013-05-29 ENCOUNTER — Other Ambulatory Visit: Payer: Self-pay | Admitting: Family Medicine

## 2013-05-29 DIAGNOSIS — E119 Type 2 diabetes mellitus without complications: Secondary | ICD-10-CM

## 2013-05-29 DIAGNOSIS — E785 Hyperlipidemia, unspecified: Secondary | ICD-10-CM

## 2013-05-31 ENCOUNTER — Encounter (INDEPENDENT_AMBULATORY_CARE_PROVIDER_SITE_OTHER): Payer: Self-pay

## 2013-05-31 ENCOUNTER — Ambulatory Visit
Admission: RE | Admit: 2013-05-31 | Discharge: 2013-05-31 | Disposition: A | Discharge status: Home / Self Care | Payer: PRIVATE HEALTH INSURANCE | Source: Ambulatory Visit | Attending: Family Medicine | Admitting: Family Medicine

## 2013-05-31 DIAGNOSIS — E119 Type 2 diabetes mellitus without complications: Secondary | ICD-10-CM

## 2013-05-31 DIAGNOSIS — E785 Hyperlipidemia, unspecified: Secondary | ICD-10-CM

## 2013-09-10 ENCOUNTER — Encounter: Payer: Self-pay | Admitting: Cardiology

## 2013-09-19 ENCOUNTER — Other Ambulatory Visit: Payer: Self-pay | Admitting: General Surgery

## 2013-09-19 DIAGNOSIS — G4733 Obstructive sleep apnea (adult) (pediatric): Secondary | ICD-10-CM

## 2013-09-20 NOTE — Progress Notes (Signed)
[place] [place], [place]  [address] Phone: [phone] Fax:  [phone]  Date:  09/21/2013   ID:  Victoria Jimenez, DOB 21-Feb-1950, MRN [id number]  PCP:  Antony Blackbird, MD  Cardiologist:  Fransico Him, MD     History of Present Illness: Victoria Jimenez is a 63 y.o. female with a history of OSA, HTN and obesity who presents today for followup. She is doing well with her CPAP therapy. She tolerates her full face mask and feels the pressure is adequate. She does not feel rested in the am and does take a nap during the day.  She wakes up a lot at night which she thinks is from pain from her legs and back.  She tries to walk for exercise when it is not too hot.   Wt Readings from Last 3 Encounters:  09/21/13 248 lb 6.4 oz (112.674 kg)  01/25/13 250 lb (113.399 kg)  01/25/13 252 lb 6.4 oz (114.488 kg)     Past Medical History  Diagnosis Date  . Hypertension   . Diabetes mellitus without complication     Type II  . Hyperlipidemia   . Herpes simplex   . GERD (gastroesophageal reflux disease)   . Allergic rhinitis   . OSA (obstructive sleep apnea)     on CPAP  . Coronary artery disease     Treated by Angioplasty    Current Outpatient Prescriptions  Medication Sig Dispense Refill  . acetaminophen (TYLENOL) 325 MG tablet Take 650 mg by mouth every 6 (six) hours as needed.      Marland Kitchen amlodipine-atorvastatin (CADUET) 10-10 MG per tablet Take 1 tablet by mouth daily.      Marland Kitchen aspirin EC 81 MG tablet Take 81 mg by mouth daily.      . Canagliflozin (INVOKANA) 300 MG TABS Take 300 mg by mouth daily.      . cetirizine (ZYRTEC) 10 MG tablet Take 10 mg by mouth daily.      Marland Kitchen CINNAMON PO Take 2 tablets by mouth 2 (two) times daily.      Marland Kitchen CRANBERRY PO Take by mouth daily.      . fish oil-omega-3 fatty acids 1000 MG capsule Take 1 g by mouth 2 (two) times daily.      . Flaxseed, Linseed, (FLAX SEED OIL PO) Take 1 capsule by mouth 2 (two) times daily.      . insulin  aspart protamine-insulin aspart (NOVOLOG 70/30) (70-30) 100 UNIT/ML injection Inject 50 Units into the skin 2 (two) times daily with a meal.       . losartan (COZAAR) 100 MG tablet Take 100 mg by mouth daily.      . metFORMIN (GLUCOPHAGE) 500 MG tablet Take 500 mg by mouth 2 (two) times daily with a meal.      . pantoprazole (PROTONIX) 40 MG tablet Take 40 mg by mouth daily.      . traMADol (ULTRAM) 50 MG tablet Take by mouth every 6 (six) hours as needed.       No current facility-administered medications for this visit.    Allergies:   No Known Allergies  Social History:  The patient  reports that she has never smoked. She does not have any smokeless tobacco history on file. She reports that she drinks alcohol. She reports that she does not use illicit drugs.   Family History:  The patient's family history includes Coronary artery disease in her father; Diabetes in her mother;  Lung cancer in her mother.   ROS:  Please see the history of present illness.      All other systems reviewed and negative.   PHYSICAL EXAM: VS:  BP 122/60  Pulse 80  Ht 5\' 6"  (1.676 m)  Wt 248 lb 6.4 oz (112.674 kg)  BMI 40.11 kg/m2 Well nourished, well developed, in no acute distress HEENT: normal Neck: no JVD Cardiac:  normal S1, S2; RRR; no murmur Lungs:  clear to auscultation bilaterally, no wheezing, rhonchi or rales Abd: soft, nontender, no hepatomegaly Ext: no edema Skin: warm and dry Neuro:  CNs 2-12 intact, no focal abnormalities noted  ASSESSMENT AND PLAN:  1. OSA on CPAP and tolerating well.  Her most recent d/l showed an AHI of 0.3/hr and 81% compliance in using more than 4 hours nightly.  Her 90th% pressure is 11.4 so I will set her at 10cm H2O and recheck a d/l in 4 weeks 2. HTN - well controlled - continue Amlodipine/Losartan  3. Obesity - encouraged her to continue walking as her leg pain tolerates.  Followup with me in 6 months    Signed, Fransico Him, MD 09/21/2013 3:40 PM

## 2013-09-21 ENCOUNTER — Ambulatory Visit (INDEPENDENT_AMBULATORY_CARE_PROVIDER_SITE_OTHER): Payer: PRIVATE HEALTH INSURANCE | Admitting: Cardiology

## 2013-09-21 ENCOUNTER — Encounter: Payer: Self-pay | Admitting: Cardiology

## 2013-09-21 VITALS — BP 122/60 | HR 80 | Ht 66.0 in | Wt 248.4 lb

## 2013-09-21 DIAGNOSIS — I10 Essential (primary) hypertension: Secondary | ICD-10-CM

## 2013-09-21 DIAGNOSIS — G4733 Obstructive sleep apnea (adult) (pediatric): Secondary | ICD-10-CM

## 2013-09-21 DIAGNOSIS — Z6841 Body Mass Index (BMI) 40.0 and over, adult: Secondary | ICD-10-CM

## 2013-09-21 NOTE — Patient Instructions (Signed)
Your physician recommends that you continue on your current medications as directed. Please refer to the Current Medication list given to you today.  Your physician wants you to follow-up in: 6 months with Dr Turner You will receive a reminder letter in the mail two months in advance. If you don't receive a letter, please call our office to schedule the follow-up appointment.  

## 2013-09-29 ENCOUNTER — Encounter: Payer: Self-pay | Admitting: Cardiology

## 2014-02-21 ENCOUNTER — Other Ambulatory Visit (HOSPITAL_COMMUNITY): Payer: Self-pay | Admitting: Nurse Practitioner

## 2014-02-21 DIAGNOSIS — Z1231 Encounter for screening mammogram for malignant neoplasm of breast: Secondary | ICD-10-CM

## 2014-02-28 ENCOUNTER — Ambulatory Visit (HOSPITAL_COMMUNITY)
Admission: RE | Admit: 2014-02-28 | Discharge: 2014-02-28 | Disposition: A | Discharge status: Home / Self Care | Payer: PRIVATE HEALTH INSURANCE | Source: Ambulatory Visit | Attending: Nurse Practitioner | Admitting: Nurse Practitioner

## 2014-02-28 DIAGNOSIS — Z1231 Encounter for screening mammogram for malignant neoplasm of breast: Secondary | ICD-10-CM | POA: Insufficient documentation

## 2014-03-21 ENCOUNTER — Encounter: Payer: PRIVATE HEALTH INSURANCE | Admitting: Cardiology

## 2014-03-21 NOTE — Progress Notes (Signed)
This encounter was created in error - please disregard.

## 2014-03-22 ENCOUNTER — Ambulatory Visit: Payer: PRIVATE HEALTH INSURANCE | Admitting: Cardiology

## 2014-04-24 NOTE — Progress Notes (Signed)
Cardiology Office Note   Date:  04/25/2014   ID:  Victoria Jimenez, DOB 29-Nov-1950, MRN [id number]  PCP:  Antony Blackbird, MD  Cardiologist:   Daneen Schick, MD   Chief Complaint  Patient presents with  . Sleep Apnea  . Hypertension  . Obesity      History of Present Illness: Victoria Jimenez is a 64 y.o. female with a history of OSA, HTN and obesity who presents today for followup. She is doing well with her CPAP therapy. She tolerates her full face mask and feels the pressure is adequate. She feels rested in the am and has no daytime sleepiness. She does have some problems with dry mouth when she gets up.  She tries to walk for exercise.  Past Medical History  Diagnosis Date  . Hypertension   . Diabetes mellitus without complication     Type II  . Hyperlipidemia   . Herpes simplex   . GERD (gastroesophageal reflux disease)   . Allergic rhinitis   . OSA (obstructive sleep apnea)     on CPAP  . Coronary artery disease     Treated by Angioplasty    Past Surgical History  Procedure Laterality Date  . Abdominal hysterectomy    . Angioplasty       Current Outpatient Prescriptions  Medication Sig Dispense Refill  . acetaminophen (TYLENOL) 325 MG tablet Take 650 mg by mouth every 6 (six) hours as needed.    Marland Kitchen amlodipine-atorvastatin (CADUET) 10-10 MG per tablet Take 1 tablet by mouth daily.    Marland Kitchen aspirin EC 81 MG tablet Take 81 mg by mouth daily.    . Canagliflozin (INVOKANA) 300 MG TABS Take 300 mg by mouth daily.    . cetirizine (ZYRTEC) 10 MG tablet Take 10 mg by mouth daily.    Marland Kitchen CINNAMON PO Take 2 tablets by mouth 2 (two) times daily.    Marland Kitchen CRANBERRY PO Take by mouth daily.    . fish oil-omega-3 fatty acids 1000 MG capsule Take 1 g by mouth 2 (two) times daily.    . Flaxseed, Linseed, (FLAX SEED OIL PO) Take 1 capsule by mouth 2 (two) times daily.    . insulin aspart protamine-insulin aspart (NOVOLOG 70/30) (70-30) 100 UNIT/ML injection Inject 50 Units into  the skin 2 (two) times daily with a meal.     . INVOKAMET 150-500 MG TABS Take 1 tablet by mouth 2 (two) times daily.  5  . losartan (COZAAR) 100 MG tablet Take 100 mg by mouth daily.    . pantoprazole (PROTONIX) 40 MG tablet Take 40 mg by mouth daily.     No current facility-administered medications for this visit.    Allergies:   Review of patient's allergies indicates no known allergies.    Social History:  The patient  reports that she has never smoked. She does not have any smokeless tobacco history on file. She reports that she drinks alcohol. She reports that she does not use illicit drugs.   Family History:  The patient's family history includes Coronary artery disease in her father; Diabetes in her mother; Lung cancer in her mother.    ROS:  Please see the history of present illness.   Otherwise, review of systems are positive for none.   All other systems are reviewed and negative.    PHYSICAL EXAM: VS:  BP 138/60 mmHg  Pulse 78  Ht 5\' 6"  (1.676 m)  Wt 245 lb 1.9 oz (111.186 kg)  BMI 39.58 kg/m2  SpO2 96% , BMI Body mass index is 39.58 kg/(m^2). GEN: Well nourished, well developed, in no acute distress HEENT: normal Neck: no JVD, carotid bruits, or masses Cardiac: RRR; no murmurs, rubs, or gallops,no edema  Respiratory:  clear to auscultation bilaterally, normal work of breathing GI: soft, nontender, nondistended, + BS MS: no deformity or atrophy Skin: warm and dry, no rash Neuro:  Strength and sensation are intact Psych: euthymic mood, full affect   EKG:  EKG is not ordered today.    Recent Labs: No results found for requested labs within last 365 days.    Lipid Panel No results found for: CHOL, TRIG, HDL, CHOLHDL, VLDL, LDLCALC, LDLDIRECT    Wt Readings from Last 3 Encounters:  04/25/14 245 lb 1.9 oz (111.186 kg)  09/21/13 248 lb 6.4 oz (112.674 kg)  01/25/13 250 lb (113.399 kg)      ASSESSMENT AND PLAN:  1. OSA on CPAP and tolerating well -  her download today showed an AHI of 0.2/hr and 72% compliance in using more than 4 hours nightly. Continue on CPAP at 10cm H2O.  I recommended that she take her device to [facility] for them to show her how to adjust the humidity 2. HTN - well controlled - continue Amlodipine/Losartan 3. Obesity - encouraged her to continue walking as her leg pain tolerates.  4.  CAD with chronic DOE that has not changed any in a long time.  SHe has not seen her cardiologist recently so I recommended that she get an appt with Dr. Tamala Julian.   Current medicines are reviewed at length with the patient today.  The patient does not have concerns regarding medicines.  The following changes have been made:  no change  Labs/ tests ordered today include: None  No orders of the defined types were placed in this encounter.     Disposition:   FU with me in 6 months   Signed, Sueanne Margarita, MD  04/25/2014 9:51 AM    [facility] [place], [place], [place]  [address] Phone: [phone]; Fax: [phone]

## 2014-04-25 ENCOUNTER — Encounter: Payer: Self-pay | Admitting: Cardiology

## 2014-04-25 ENCOUNTER — Ambulatory Visit (INDEPENDENT_AMBULATORY_CARE_PROVIDER_SITE_OTHER): Payer: PRIVATE HEALTH INSURANCE | Admitting: Cardiology

## 2014-04-25 VITALS — BP 138/60 | HR 78 | Ht 66.0 in | Wt 245.1 lb

## 2014-04-25 DIAGNOSIS — G4733 Obstructive sleep apnea (adult) (pediatric): Secondary | ICD-10-CM | POA: Diagnosis not present

## 2014-04-25 DIAGNOSIS — I10 Essential (primary) hypertension: Secondary | ICD-10-CM | POA: Diagnosis not present

## 2014-04-25 DIAGNOSIS — Z6841 Body Mass Index (BMI) 40.0 and over, adult: Secondary | ICD-10-CM

## 2014-04-25 NOTE — Patient Instructions (Signed)
You have been referred to Dr. Tamala Julian to follow your CAD.   Your physician wants you to follow-up in: 6 months with Dr. Radford Pax. You will receive a reminder letter in the mail two months in advance. If you don't receive a letter, please call our office to schedule the follow-up appointment.

## 2014-05-03 ENCOUNTER — Encounter: Payer: Self-pay | Admitting: Cardiology

## 2014-05-08 ENCOUNTER — Other Ambulatory Visit (HOSPITAL_COMMUNITY): Payer: Self-pay | Admitting: Interventional Cardiology

## 2014-05-08 ENCOUNTER — Ambulatory Visit (INDEPENDENT_AMBULATORY_CARE_PROVIDER_SITE_OTHER): Payer: PRIVATE HEALTH INSURANCE | Admitting: Cardiology

## 2014-05-08 ENCOUNTER — Encounter: Payer: Self-pay | Admitting: Cardiology

## 2014-05-08 VITALS — BP 132/60 | HR 63 | Ht 66.0 in | Wt 247.6 lb

## 2014-05-08 DIAGNOSIS — I10 Essential (primary) hypertension: Secondary | ICD-10-CM

## 2014-05-08 DIAGNOSIS — I6523 Occlusion and stenosis of bilateral carotid arteries: Secondary | ICD-10-CM

## 2014-05-08 DIAGNOSIS — I739 Peripheral vascular disease, unspecified: Secondary | ICD-10-CM | POA: Insufficient documentation

## 2014-05-08 DIAGNOSIS — R079 Chest pain, unspecified: Secondary | ICD-10-CM | POA: Insufficient documentation

## 2014-05-08 DIAGNOSIS — Z0389 Encounter for observation for other suspected diseases and conditions ruled out: Secondary | ICD-10-CM

## 2014-05-08 DIAGNOSIS — R0609 Other forms of dyspnea: Secondary | ICD-10-CM

## 2014-05-08 DIAGNOSIS — R0600 Dyspnea, unspecified: Secondary | ICD-10-CM | POA: Insufficient documentation

## 2014-05-08 DIAGNOSIS — IMO0001 Reserved for inherently not codable concepts without codable children: Secondary | ICD-10-CM

## 2014-05-08 DIAGNOSIS — E669 Obesity, unspecified: Secondary | ICD-10-CM

## 2014-05-08 DIAGNOSIS — E1140 Type 2 diabetes mellitus with diabetic neuropathy, unspecified: Secondary | ICD-10-CM

## 2014-05-08 DIAGNOSIS — G4733 Obstructive sleep apnea (adult) (pediatric): Secondary | ICD-10-CM

## 2014-05-08 NOTE — Assessment & Plan Note (Signed)
BMI 38 

## 2014-05-08 NOTE — Assessment & Plan Note (Signed)
She complains of exertional chest and arm pain, she thinks its more noticeable lately

## 2014-05-08 NOTE — Patient Instructions (Addendum)
Your physician has requested that you have a carotid duplex. This test is an ultrasound of the carotid arteries in your neck. It looks at blood flow through these arteries that supply the brain with blood. Allow one hour for this exam. There are no restrictions or special instructions. This test should be preformed in April    Your physician has requested that you have an exercise stress myoview. For further information please visit [email or web address]. Please follow instruction sheet, as given.  Your physician recommends that you schedule a follow-up appointment with Dr.Smith in his First Available spot.

## 2014-05-08 NOTE — Assessment & Plan Note (Signed)
Controlled.  

## 2014-05-08 NOTE — Assessment & Plan Note (Signed)
Toe numbness at night

## 2014-05-08 NOTE — Assessment & Plan Note (Signed)
Moderate ICA disease by doppler April 2015

## 2014-05-08 NOTE — Progress Notes (Signed)
05/08/2014 Victoria Jimenez   [birth year]  [id number]  Primary Physician Antony Blackbird, MD Primary Cardiologist: Dr Tamala Julian  HPI:  64 y/o obese female followed by Dr Tamala Julian and Dr Radford Pax. She had normal coronaries in 2006 and a low risk Myoview in 2013. Dr Radford Pax recently saw her for sleep apnea, apparently the pt is doing well from that standpoint. She is seeing me today and complained of DOE and exertional chest tightness and arm pain. She says she trys to exercise but cant get far secondary to these symptoms. She thinks this has recently become more noticeable.    Current Outpatient Prescriptions  Medication Sig Dispense Refill  . acetaminophen (TYLENOL) 325 MG tablet Take 650 mg by mouth every 6 (six) hours as needed.    Marland Kitchen amlodipine-atorvastatin (CADUET) 10-10 MG per tablet Take 1 tablet by mouth daily.    Marland Kitchen aspirin EC 81 MG tablet Take 81 mg by mouth daily.    Marland Kitchen CINNAMON PO Take 2 tablets by mouth 2 (two) times daily.    Marland Kitchen CRANBERRY PO Take by mouth daily.    . fish oil-omega-3 fatty acids 1000 MG capsule Take 1 g by mouth 2 (two) times daily.    . Flaxseed, Linseed, (FLAX SEED OIL PO) Take 1 capsule by mouth 2 (two) times daily.    . hydrochlorothiazide (HYDRODIURIL) 25 MG tablet Take 25 mg by mouth daily.    . insulin aspart protamine-insulin aspart (NOVOLOG 70/30) (70-30) 100 UNIT/ML injection Inject 50 Units into the skin 2 (two) times daily with a meal.     . INVOKAMET 150-500 MG TABS Take 1 tablet by mouth 2 (two) times daily.  5  . losartan (COZAAR) 100 MG tablet Take 100 mg by mouth daily.    . pantoprazole (PROTONIX) 40 MG tablet Take 40 mg by mouth daily.     No current facility-administered medications for this visit.    No Known Allergies  History   Social History  . Marital Status: Married    Spouse Name: N/A  . Number of Children: N/A  . Years of Education: N/A   Occupational History  . Not on file.   Social History Main Topics  . Smoking status: Never  Smoker   . Smokeless tobacco: Not on file  . Alcohol Use: Yes     Comment: rare  . Drug Use: No  . Sexual Activity: Not on file   Other Topics Concern  . Not on file   Social History Narrative     Review of Systems: General: negative for chills, fever, night sweats or weight changes.  Cardiovascular: negative for chest pain, dyspnea on exertion, edema, orthopnea, palpitations, paroxysmal nocturnal dyspnea or shortness of breath Dermatological: negative for rash Respiratory: negative for cough or wheezing Urologic: negative for hematuria Abdominal: negative for nausea, vomiting, diarrhea, bright red blood per rectum, melena, or hematemesis Neurologic: negative for visual changes, syncope, or dizziness All other systems reviewed and are otherwise negative except as noted above.    Blood pressure 132/60, pulse 63, height 5\' 6"  (1.676 m), weight 247 lb 9.6 oz (112.311 kg).  General appearance: alert, cooperative, no distress and morbidly obese Neck: no carotid bruit and no JVD Lungs: clear to auscultation bilaterally Heart: regular rate and rhythm Abdomen: obese Extremities: no edema  EKG NSST changes, no change from 2013  ASSESSMENT AND PLAN:   Chest pain with moderate risk of acute coronary syndrome She complains of exertional chest and arm pain, she thinks  its more noticeable lately   Exertional dyspnea Noted recently to be more severe   Obstructive sleep apnea On C-pap followed by Dr Radford Pax   Hypertension Controlled   Obesity (BMI 30-39.9) BMI 38   Type 2 diabetes, controlled, with neuropathy Toe numbness at night   PVD (peripheral vascular disease) Moderate ICA disease by doppler April 2015    PLAN  I ordered an exercise Myoview. She can follow up with Dr Tamala Julian after this. I also ordered f/u carotid dopplers.   Nathyn Luiz KPA-C 05/08/2014 12:20 PM

## 2014-05-08 NOTE — Assessment & Plan Note (Signed)
On C-pap followed by Dr Radford Pax

## 2014-05-08 NOTE — Assessment & Plan Note (Signed)
Noted recently to be more severe

## 2014-05-17 ENCOUNTER — Telehealth (HOSPITAL_COMMUNITY): Payer: Self-pay

## 2014-05-17 NOTE — Telephone Encounter (Signed)
Encounter complete. 

## 2014-05-23 ENCOUNTER — Ambulatory Visit (HOSPITAL_COMMUNITY)
Admission: RE | Admit: 2014-05-23 | Discharge: 2014-05-23 | Disposition: A | Discharge status: Home / Self Care | Payer: PRIVATE HEALTH INSURANCE | Source: Ambulatory Visit | Attending: Internal Medicine | Admitting: Internal Medicine

## 2014-05-23 DIAGNOSIS — R079 Chest pain, unspecified: Secondary | ICD-10-CM | POA: Diagnosis not present

## 2014-05-23 DIAGNOSIS — R0609 Other forms of dyspnea: Secondary | ICD-10-CM

## 2014-05-23 DIAGNOSIS — I739 Peripheral vascular disease, unspecified: Secondary | ICD-10-CM | POA: Diagnosis not present

## 2014-05-23 DIAGNOSIS — R42 Dizziness and giddiness: Secondary | ICD-10-CM | POA: Insufficient documentation

## 2014-05-23 DIAGNOSIS — E669 Obesity, unspecified: Secondary | ICD-10-CM | POA: Insufficient documentation

## 2014-05-23 DIAGNOSIS — R5383 Other fatigue: Secondary | ICD-10-CM | POA: Insufficient documentation

## 2014-05-23 DIAGNOSIS — R002 Palpitations: Secondary | ICD-10-CM | POA: Diagnosis not present

## 2014-05-23 DIAGNOSIS — Z6839 Body mass index (BMI) 39.0-39.9, adult: Secondary | ICD-10-CM | POA: Insufficient documentation

## 2014-05-23 DIAGNOSIS — I10 Essential (primary) hypertension: Secondary | ICD-10-CM | POA: Insufficient documentation

## 2014-05-23 MED ORDER — TECHNETIUM TC 99M SESTAMIBI GENERIC - CARDIOLITE
32.5000 | Freq: Once | INTRAVENOUS | Status: AC | PRN
  Administered 2014-05-23: 33 via INTRAVENOUS

## 2014-05-23 NOTE — Procedures (Addendum)
[facility] [address] [place] [address] [phone]  Cardiology Nuclear Med Study  Victoria Jimenez is a 64 y.o. female     MRN : [id number]     DOB: 10-29-1950  Procedure Date: 05/23/2014  Nuclear Med Background Indication for Stress Test:  Evaluation for Ischemia History:  CATH in 2007-normal;Last NUC MPI on 07/22/2014-normal;EF=65% Cardiac Risk Factors: Carotid Disease, Hypertension, IDDM Type 2, Lipids, Obesity and PVD  Symptoms:  Chest Pain, Dizziness, DOE, Fatigue, Light-Headedness and Palpitations   Nuclear Pre-Procedure Caffeine/Decaff Intake:  9:00pm NPO After: 5:00am   IV Site: R Forearm  IV 0.9% NS with Angio Cath:  22g  Chest Size (in):  n/a IV Started by: Rolene Course, RN  Height: 5\' 6"  (1.676 m)  Cup Size: DD  BMI:  Body mass index is 39.89 kg/(m^2). Weight:  247 lb (112.038 kg)   Tech Comments:  n/a    Nuclear Med Study 1 or 2 day study: 2 day  Stress Test Type:  Stress  Order Authorizing Provider:  Daneen Schick, MD   Resting Radionuclide: Technetium 57m Sestamibi  Resting Radionuclide Dose: 32.9 mCi   Stress Radionuclide:  Technetium 29m Sestamibi  Stress Radionuclide Dose: 32.5 mCi           Stress Protocol Rest HR: 73 Stress HR: 155  Rest BP: 150/70 Stress BP: 167/75  Exercise Time (min): 4:30 METS: 6.4   Predicted Max HR: 159 bpm % Max HR: 97.48 bpm Rate Pressure Product: 27900  Dose of Adenosine (mg):  n/a Dose of Lexiscan: n/a mg  Dose of Atropine (mg): n/a Dose of Dobutamine: n/a mcg/kg/min (at max HR)  Stress Test Technologist: Leane Para, CCT Nuclear Technologist:Elizabeth Young,CNMT   Rest Procedure:  Myocardial perfusion imaging was performed at rest 45 minutes following the intravenous administration of Technetium 92m Sestamibi. Stress Procedure:  The patient performed treadmill exercise using a Bruce  Protocol for 4:30 minutes. The patient stopped due to SOB, Leg  Discomfort and denied any chest pain.  There were  significant ST-T wave changes.  Technetium 45m Sestamibi was injected IV at peak exercise and myocardial perfusion imaging was performed after a brief delay.  Transient Ischemic Dilatation (Normal <1.22):  0.75 QGS EDV:  70 ml QGS ESV:  27 ml LV Ejection Fraction: 62%     Rest ECG: NSR - Normal EKG  Stress ECG: Significant ST abnormalities consistent with ischemia. Downsloping 1.5-2.5 mm ST depression in V5-V6 and inferior leads  QPS Raw Data Images:  Normal; no motion artifact; normal heart/lung ratio. Stress Images:  Normal homogeneous uptake in all areas of the myocardium. Rest Images:  Normal homogeneous uptake in all areas of the myocardium. Subtraction (SDS):  No evidence of ischemia. LV Wall Motion:  NL LV Function; NL Wall Motion  Impression Exercise Capacity:  Fair exercise capacity. BP Response:  Normal blood pressure response. Clinical Symptoms:  The exercise was limited by chest pain and dyspnea. ECG Impression:  Significant ST abnormalities consistent with ischemia. Comparison with Prior Nuclear Study: No significant change from previous study   Overall Impression:  Low risk stress nuclear study with normal perfusion images. Chest pain and dyspnea developed with low level exercise and there were marked ST segment changes consistent with ischemia.  Cannot exclude "balanced ischemia". Consider coronary syndrome X/microvascular dysfunction.   Sanda Klein, MD  05/24/2014 1:07 PM

## 2014-05-24 ENCOUNTER — Ambulatory Visit (HOSPITAL_BASED_OUTPATIENT_CLINIC_OR_DEPARTMENT_OTHER)
Admission: RE | Admit: 2014-05-24 | Discharge: 2014-05-24 | Disposition: A | Discharge status: Home / Self Care | Payer: PRIVATE HEALTH INSURANCE | Source: Ambulatory Visit | Attending: Cardiovascular Disease | Admitting: Cardiovascular Disease

## 2014-05-24 ENCOUNTER — Ambulatory Visit (HOSPITAL_COMMUNITY)
Admission: RE | Admit: 2014-05-24 | Discharge: 2014-05-24 | Disposition: A | Discharge status: Home / Self Care | Payer: PRIVATE HEALTH INSURANCE | Source: Ambulatory Visit | Attending: Cardiovascular Disease | Admitting: Cardiovascular Disease

## 2014-05-24 DIAGNOSIS — I10 Essential (primary) hypertension: Secondary | ICD-10-CM | POA: Insufficient documentation

## 2014-05-24 DIAGNOSIS — E119 Type 2 diabetes mellitus without complications: Secondary | ICD-10-CM | POA: Insufficient documentation

## 2014-05-24 DIAGNOSIS — Z6839 Body mass index (BMI) 39.0-39.9, adult: Secondary | ICD-10-CM | POA: Diagnosis not present

## 2014-05-24 DIAGNOSIS — R0609 Other forms of dyspnea: Secondary | ICD-10-CM | POA: Insufficient documentation

## 2014-05-24 DIAGNOSIS — E669 Obesity, unspecified: Secondary | ICD-10-CM | POA: Insufficient documentation

## 2014-05-24 DIAGNOSIS — I6523 Occlusion and stenosis of bilateral carotid arteries: Secondary | ICD-10-CM

## 2014-05-24 DIAGNOSIS — R5383 Other fatigue: Secondary | ICD-10-CM | POA: Insufficient documentation

## 2014-05-24 DIAGNOSIS — R079 Chest pain, unspecified: Secondary | ICD-10-CM | POA: Diagnosis present

## 2014-05-24 DIAGNOSIS — I739 Peripheral vascular disease, unspecified: Secondary | ICD-10-CM | POA: Insufficient documentation

## 2014-05-24 DIAGNOSIS — R42 Dizziness and giddiness: Secondary | ICD-10-CM | POA: Diagnosis not present

## 2014-05-24 MED ORDER — TECHNETIUM TC 99M SESTAMIBI GENERIC - CARDIOLITE
32.9000 | Freq: Once | INTRAVENOUS | Status: AC | PRN
  Administered 2014-05-24: 32.9 via INTRAVENOUS

## 2014-05-24 NOTE — Progress Notes (Signed)
Carotid duplex completed. [place]

## 2014-05-28 ENCOUNTER — Telehealth: Payer: Self-pay

## 2014-05-28 NOTE — Telephone Encounter (Signed)
Pt aware of myoview results.the study is low risk and reassuring concerning the heart as source of complaints. Pt verbalized understanding.

## 2014-06-05 ENCOUNTER — Telehealth: Payer: Self-pay

## 2014-06-05 NOTE — Telephone Encounter (Signed)
Follow up ° ° ° ° °Returning a nurses call °

## 2014-06-05 NOTE — Telephone Encounter (Signed)
called to give carotid results.lmtcb

## 2014-06-05 NOTE — Telephone Encounter (Signed)
Returned pt call. 2nd attempt to give pt carotid results

## 2014-06-05 NOTE — Telephone Encounter (Signed)
-----   Message from Belva Crome, MD sent at 06/01/2014  2:46 PM EDT ----- No significant blockages.

## 2014-06-07 NOTE — Telephone Encounter (Signed)
3rd attempt to reach pt.called to giev pt carotid dopp results.No significant blockages.we can go over results at upcoming appt

## 2014-06-18 ENCOUNTER — Encounter: Payer: Self-pay | Admitting: Interventional Cardiology

## 2014-06-18 ENCOUNTER — Telehealth: Payer: Self-pay

## 2014-06-18 ENCOUNTER — Ambulatory Visit (INDEPENDENT_AMBULATORY_CARE_PROVIDER_SITE_OTHER): Payer: PRIVATE HEALTH INSURANCE | Admitting: Interventional Cardiology

## 2014-06-18 VITALS — BP 158/80 | HR 72 | Ht 66.0 in | Wt 248.6 lb

## 2014-06-18 DIAGNOSIS — I10 Essential (primary) hypertension: Secondary | ICD-10-CM

## 2014-06-18 DIAGNOSIS — G4733 Obstructive sleep apnea (adult) (pediatric): Secondary | ICD-10-CM

## 2014-06-18 DIAGNOSIS — M5441 Lumbago with sciatica, right side: Secondary | ICD-10-CM | POA: Diagnosis not present

## 2014-06-18 DIAGNOSIS — E1140 Type 2 diabetes mellitus with diabetic neuropathy, unspecified: Secondary | ICD-10-CM

## 2014-06-18 DIAGNOSIS — R079 Chest pain, unspecified: Secondary | ICD-10-CM | POA: Diagnosis not present

## 2014-06-18 DIAGNOSIS — M5442 Lumbago with sciatica, left side: Secondary | ICD-10-CM

## 2014-06-18 DIAGNOSIS — M549 Dorsalgia, unspecified: Secondary | ICD-10-CM | POA: Insufficient documentation

## 2014-06-18 MED ORDER — METOPROLOL SUCCINATE ER 25 MG PO TB24: 25.0000 mg | ORAL_TABLET | Freq: Every day | ORAL | Status: DC

## 2014-06-18 NOTE — Telephone Encounter (Signed)
Pt called in to report that Metoprolol Succinate is on the formulary for [facility]. Tel# [phone] Rx sent to pt pharmacy for Metoprolol Succinate 25mg  qd #90 R-3.

## 2014-06-18 NOTE — Patient Instructions (Signed)
Medication Instructions:  Your physician recommends that you continue on your current medications as directed. Please refer to the Current Medication list given to you today.   Labwork: None   Testing/Procedures: None   Follow-Up: Your physician wants you to follow-up in: 2-4 months with Dr.Smith You will receive a reminder letter in the mail two months in advance. If you don't receive a letter, please call our office to schedule the follow-up appointment.   Any Other Special Instructions Will Be Listed Below (If Applicable). Please call RX outreach to find our which beta-blocker medication is on the formulary. Please call our office with that information. We would like to start Metoprolol Succinate 25mg  daily

## 2014-06-18 NOTE — Progress Notes (Signed)
Cardiology Office Note   Date:  06/18/2014   ID:  [name], DOB 10-12-1950, MRN [id number]  PCP:  Elizabeth Palau, MD  Cardiologist:   Sinclair Grooms, MD   Chief Complaint  Patient presents with  . Hypertension      History of Present Illness: Victoria Jimenez is a 64 y.o. female who presents for lumbar disc disease, hypertension, diabetes, chest palpitations and pounding with exertion.  Doing relatively well. Nuclear study did not demonstrate perfusion abnormality. There was diffuse ST segment change. She is concerned she has peripheral vascular disease because her lower back and buttocks hips and legs hurt with walking especially trying to go up an incline or stairs. This is been that way for years.     Past Medical History  Diagnosis Date  . Hypertension   . Diabetes mellitus without complication     Type II  . Hyperlipidemia   . Herpes simplex   . GERD (gastroesophageal reflux disease)   . Allergic rhinitis   . OSA (obstructive sleep apnea)     on CPAP  . Coronary artery disease     Treated by Angioplasty    Past Surgical History  Procedure Laterality Date  . Abdominal hysterectomy    . Angioplasty    . Tubal ligation    . Cardiac catheterization       Current Outpatient Prescriptions  Medication Sig Dispense Refill  . acetaminophen (TYLENOL) 325 MG tablet Take 650 mg by mouth every 6 (six) hours as needed.    Marland Kitchen amlodipine-atorvastatin (CADUET) 10-10 MG per tablet Take 1 tablet by mouth daily.    Marland Kitchen aspirin EC 81 MG tablet Take 81 mg by mouth daily.    Marland Kitchen CINNAMON PO Take 2 tablets by mouth 2 (two) times daily.    Marland Kitchen CRANBERRY PO Take by mouth daily.    . fish oil-omega-3 fatty acids 1000 MG capsule Take 1 g by mouth 2 (two) times daily.    . Flaxseed, Linseed, (FLAX SEED OIL PO) Take 1 capsule by mouth 2 (two) times daily.    . hydrochlorothiazide (HYDRODIURIL) 25 MG tablet Take 25 mg by mouth daily.    . insulin aspart  protamine-insulin aspart (NOVOLOG 70/30) (70-30) 100 UNIT/ML injection Inject 50 Units into the skin 2 (two) times daily with a meal.     . INVOKAMET 150-500 MG TABS Take 1 tablet by mouth 2 (two) times daily.  5  . losartan (COZAAR) 100 MG tablet Take 100 mg by mouth daily.    . pantoprazole (PROTONIX) 40 MG tablet Take 40 mg by mouth daily.     No current facility-administered medications for this visit.    Allergies:   Review of patient's allergies indicates no known allergies.    Social History:  The patient  reports that she has never smoked. She does not have any smokeless tobacco history on file. She reports that she drinks alcohol. She reports that she does not use illicit drugs.   Family History:  The patient's family history includes Coronary artery disease in her father; Diabetes in her brother, maternal grandmother, and mother; Hypertension in her sister; Lung cancer in her mother.    ROS:  Please see the history of present illness.   Otherwise, review of systems are positive for joint swelling, leg pain, back pain, abdominal pain..   All other systems are reviewed and negative.    PHYSICAL EXAM: VS:  BP 158/80 mmHg  Pulse 72  Ht 5\' 6"  (1.676 m)  Wt 248 lb 9.6 oz (112.764 kg)  BMI 40.14 kg/m2  SpO2 97% , BMI Body mass index is 40.14 kg/(m^2). GEN: Well nourished, well developed, in no acute distress. Morbidly obese  HEENT: normal Neck: no JVD, carotid bruits, or masses Cardiac: RRR; no murmurs, rubs, or gallops,no edema  Respiratory:  clear to auscultation bilaterally, normal work of breathing GI: soft, nontender, nondistended, + BS MS: no deformity or atrophy Skin: warm and dry, no rash Neuro:  Strength and sensation are intact Psych: euthymic mood, full affect   EKG:  EKG is not ordered today.    Recent Labs: No results found for requested labs within last 365 days.    Lipid Panel No results found for: CHOL, TRIG, HDL, CHOLHDL, VLDL, LDLCALC, LDLDIRECT      Wt Readings from Last 3 Encounters:  06/18/14 248 lb 9.6 oz (112.764 kg)  05/08/14 247 lb 9.6 oz (112.311 kg)  04/25/14 245 lb 1.9 oz (111.186 kg)      Other studies Reviewed: Additional studies/ records that were reviewed today include: Reviewed in OMR I of the back from 2005. Review of the above records demonstrates: This demonstrates degenerative disc disease L4 L5 and L5-S1 with a ruptured fragment.   ASSESSMENT AND PLAN:  Low back and leg discomfort:  degenerative disc disease and lumbar area  Diabetes mellitus with neuropathy  Chest pain with with exertion and recent low risk myocardial perfusion study: By history is sounds as though her complaint is more that she can feel her heart beating fast and hard than it is of chest discomfort.  Essential hypertension: Poor control     Current medicines are reviewed at length with the patient today.  The patient does not have concerns regarding medicines.  The following changes have been made:  The blood pressures not as well controlled as it could be. I will had metoprolol succinate 25 mg per day. We may need to up titrate the medication. The beta blocker will help decrease the pounding sensation in her chest and neck when she exerts herself. She is significantly physically deconditioned.  Labs/ tests ordered today include:  No orders of the defined types were placed in this encounter.     Disposition:   FU with HS in 2 months  Signed, Sinclair Grooms, MD  06/18/2014 9:41 AM    [facility] [place], [place], [place]  [address] Phone: [phone]; Fax: [phone]

## 2014-06-20 ENCOUNTER — Other Ambulatory Visit: Payer: Self-pay

## 2014-06-20 MED ORDER — METOPROLOL SUCCINATE ER 25 MG PO TB24: 25.0000 mg | ORAL_TABLET | Freq: Every day | ORAL | Status: DC

## 2014-06-20 NOTE — Telephone Encounter (Signed)
Pt Rx for Metoprolol originally e-scribed to [facility] on 5/9.with received by pharmacy confirmation Pt call to sts thst her pharmacy said it ws not received. RX faxed [phone]

## 2014-07-20 ENCOUNTER — Ambulatory Visit: Payer: PRIVATE HEALTH INSURANCE | Admitting: Interventional Cardiology

## 2014-08-02 ENCOUNTER — Encounter: Payer: Self-pay | Admitting: Cardiology

## 2014-09-14 ENCOUNTER — Telehealth: Payer: Self-pay | Admitting: Cardiology

## 2014-09-14 DIAGNOSIS — G4733 Obstructive sleep apnea (adult) (pediatric): Secondary | ICD-10-CM

## 2014-09-14 NOTE — Telephone Encounter (Signed)
Patient notified that there are orders for [facility].

## 2014-09-14 NOTE — Telephone Encounter (Signed)
New message      Please send an order to [facility] for [facility]AP supplies.  Please call pt when then has been done.

## 2014-10-11 ENCOUNTER — Telehealth: Payer: Self-pay | Admitting: Cardiology

## 2014-10-11 NOTE — Telephone Encounter (Signed)
New Message  Pt calling to speak w/ RN concerning CPAP machine. Pt stated the equipment has not been working the last few days and wonders if she can get a new one, or help w/ current one. Please call back and discuss.

## 2014-10-11 NOTE — Telephone Encounter (Signed)
Spoke with patient regarding her machine. She is going to contact [facility] to see if she can bring the machine in to be looked at.  She will call me if she needs anything further.

## 2014-10-28 NOTE — Progress Notes (Signed)
Cardiology Office Note   Date:  10/29/2014   ID:  Victoria Jimenez, DOB 23-Oct-1950, MRN [id number]  PCP:  Elizabeth Palau, MD  Cardiologist:  Sinclair Grooms, MD   Chief Complaint  Patient presents with  . Chest Pain      History of Present Illness: Victoria Jimenez is a 64 y.o. female who presents for lower back discomfort, structure sleep apnea, obesity, diabetes, essential hypertension, and prior history of CAD treated with PCI by Dr. Melvern Banker remotely.  Denies anginal quality chest discomfort. Mild exertional dyspnea. No orthopnea PND. Using C Pap on a regular basis. Since starting C Pap for sleep apnea she is felt much better. Myocardial perfusion study that we did was low risk.  Past Medical History  Diagnosis Date  . Hypertension   . Diabetes mellitus without complication     Type II  . Hyperlipidemia   . Herpes simplex   . GERD (gastroesophageal reflux disease)   . Allergic rhinitis   . OSA (obstructive sleep apnea)     on CPAP  . Coronary artery disease     Treated by Angioplasty    Past Surgical History  Procedure Laterality Date  . Abdominal hysterectomy    . Angioplasty    . Tubal ligation    . Cardiac catheterization       Current Outpatient Prescriptions  Medication Sig Dispense Refill  . acetaminophen (TYLENOL) 325 MG tablet Take 650 mg by mouth every 6 (six) hours as needed.    Marland Kitchen amLODipine-atorvastatin (CADUET) 10-40 MG per tablet Take 1 tablet by mouth daily.    Marland Kitchen aspirin EC 81 MG tablet Take 81 mg by mouth daily.    Marland Kitchen CINNAMON PO Take 2 tablets by mouth 2 (two) times daily.    Marland Kitchen CRANBERRY PO Take 1 tablet by mouth daily.     . fish oil-omega-3 fatty acids 1000 MG capsule Take 1 g by mouth 2 (two) times daily.    . Flaxseed, Linseed, (FLAX SEED OIL PO) Take 1 capsule by mouth 2 (two) times daily.    . hydrochlorothiazide (HYDRODIURIL) 25 MG tablet Take 25 mg by mouth daily.    . insulin aspart protamine-insulin aspart (NOVOLOG  70/30) (70-30) 100 UNIT/ML injection Inject 60 Units into the skin 2 (two) times daily with a meal.     . INVOKAMET 150-500 MG TABS Take 1 tablet by mouth 2 (two) times daily.  5  . LEVEMIR FLEXTOUCH 100 UNIT/ML Pen Inject 10 Units into the skin at bedtime.  5  . losartan (COZAAR) 100 MG tablet Take 100 mg by mouth daily.    . metoprolol succinate (TOPROL XL) 25 MG 24 hr tablet Take 1 tablet (25 mg total) by mouth daily. 90 tablet 3  . pantoprazole (PROTONIX) 40 MG tablet Take 40 mg by mouth daily.    . Potassium Gluconate 595 MG TBCR Take 1 tablet by mouth daily.     No current facility-administered medications for this visit.    Allergies:   Review of patient's allergies indicates no known allergies.    Social History:  The patient  reports that she has never smoked. She has never used smokeless tobacco. She reports that she drinks alcohol. She reports that she does not use illicit drugs.   Family History:  The patient's family history includes Coronary artery disease in her father; Diabetes in her brother, maternal grandmother, and mother; Hypertension in her sister; Lung cancer in her mother.  ROS:  Please see the history of present illness.   Otherwise, review of systems are positive for C Pap mask is too tight..   All other systems are reviewed and negative.    PHYSICAL EXAM: VS:  BP 124/58 mmHg  Pulse 64  Ht 5\' 6"  (1.676 m)  Wt 113.581 kg (250 lb 6.4 oz)  BMI 40.44 kg/m2  SpO2 96% , BMI Body mass index is 40.44 kg/(m^2). GEN: Well nourished, well developed, in no acute distress HEENT: normal Neck: no JVD, carotid bruits, or masses Cardiac: RRR.  There is no murmur, rub, or gallop. There is no edema. Respiratory:  clear to auscultation bilaterally, normal work of breathing. GI: soft, nontender, nondistended, + BS MS: no deformity or atrophy Skin: warm and dry, no rash Neuro:  Strength and sensation are intact Psych: euthymic mood, full affect   EKG:  EKG is not  ordered today.    Recent Labs: No results found for requested labs within last 365 days.    Lipid Panel No results found for: CHOL, TRIG, HDL, CHOLHDL, VLDL, LDLCALC, LDLDIRECT    Wt Readings from Last 3 Encounters:  10/29/14 113.581 kg (250 lb 6.4 oz)  06/18/14 112.764 kg (248 lb 9.6 oz)  05/23/14 112.038 kg (247 lb)      Other studies Reviewed: Additional studies/ records that were reviewed today include: Sleep study results.. The findings include successful C Pap titration..    ASSESSMENT AND PLAN:  1. Exertional dyspnea Improved  2. Essential hypertension Controlled  3. PVD (peripheral vascular disease) Asymptomatic  4. Chest pain with moderate risk of acute coronary syndrome No recurrence. Low risk myocardial perfusion study and past  5. Type 2 diabetes, controlled, with neuropathy Managed by primary care  6. Obstructive sleep apnea Treated with CPM    Current medicines are reviewed at length with the patient today.  The patient has the following concerns regarding medicines: No concerns..  The following changes/actions have been instituted:    Aerobic activity  Caloric reduction to encourage weight loss  Labs/ tests ordered today include:  No orders of the defined types were placed in this encounter.     Disposition:   FU with HS in as needed   Signed, Sinclair Grooms, MD  10/29/2014 9:58 AM    [facility] [place], [place], [place]  [address] Phone: [phone]; Fax: [phone]

## 2014-10-29 ENCOUNTER — Ambulatory Visit (INDEPENDENT_AMBULATORY_CARE_PROVIDER_SITE_OTHER): Payer: PRIVATE HEALTH INSURANCE | Admitting: Interventional Cardiology

## 2014-10-29 ENCOUNTER — Encounter: Payer: Self-pay | Admitting: Interventional Cardiology

## 2014-10-29 VITALS — BP 124/58 | HR 64 | Ht 66.0 in | Wt 250.4 lb

## 2014-10-29 DIAGNOSIS — I10 Essential (primary) hypertension: Secondary | ICD-10-CM | POA: Diagnosis not present

## 2014-10-29 DIAGNOSIS — G4733 Obstructive sleep apnea (adult) (pediatric): Secondary | ICD-10-CM

## 2014-10-29 DIAGNOSIS — R079 Chest pain, unspecified: Secondary | ICD-10-CM

## 2014-10-29 DIAGNOSIS — R0609 Other forms of dyspnea: Secondary | ICD-10-CM

## 2014-10-29 DIAGNOSIS — E1140 Type 2 diabetes mellitus with diabetic neuropathy, unspecified: Secondary | ICD-10-CM

## 2014-10-29 DIAGNOSIS — I739 Peripheral vascular disease, unspecified: Secondary | ICD-10-CM

## 2014-10-29 NOTE — Patient Instructions (Signed)
Medication Instructions:  Your physician recommends that you continue on your current medications as directed. Please refer to the Current Medication list given to you today.   Labwork: None ordered   Testing/Procedures: None ordered   Follow-Up: Your physician recommends that you schedule a follow-up appointment as needed    Any Other Special Instructions Will Be Listed Below (If Applicable).   

## 2014-11-02 ENCOUNTER — Encounter: Payer: Self-pay | Admitting: Gastroenterology

## 2014-11-07 NOTE — Progress Notes (Signed)
Cardiology Office Note   Date:  11/08/2014   ID:  Victoria Jimenez, DOB 24-Sep-1950, MRN [id number]  PCP:  Elizabeth Palau, MD    Chief Complaint  Patient presents with  . OSA      History of Present Illness: Victoria Jimenez is a 64 y.o. female with a history of OSA, HTN and obesity who presents today for followup. She is doing well with her CPAP therapy. She tolerates her full face mask and feels the pressure is adequate. She feels rested in the am and has no daytime sleepiness. She did have some problems with dry mouth when she would get up but that has resovled. She tries to walk for exercise.     Past Medical History  Diagnosis Date  . Hypertension   . Diabetes mellitus without complication     Type II  . Hyperlipidemia   . Herpes simplex   . GERD (gastroesophageal reflux disease)   . Allergic rhinitis   . OSA (obstructive sleep apnea)     on CPAP  . Coronary artery disease     Treated by Angioplasty    Past Surgical History  Procedure Laterality Date  . Abdominal hysterectomy    . Angioplasty    . Tubal ligation    . Cardiac catheterization       Current Outpatient Prescriptions  Medication Sig Dispense Refill  . acetaminophen (TYLENOL) 325 MG tablet Take 650 mg by mouth every 6 (six) hours as needed.    Marland Kitchen amLODipine-atorvastatin (CADUET) 10-40 MG per tablet Take 1 tablet by mouth daily.    Marland Kitchen aspirin EC 81 MG tablet Take 81 mg by mouth daily.    Marland Kitchen CINNAMON PO Take 2 tablets by mouth 2 (two) times daily.    Marland Kitchen CRANBERRY PO Take 1 tablet by mouth daily.     . fish oil-omega-3 fatty acids 1000 MG capsule Take 1 g by mouth 2 (two) times daily.    . Flaxseed, Linseed, (FLAX SEED OIL PO) Take 1 capsule by mouth 2 (two) times daily.    . hydrochlorothiazide (HYDRODIURIL) 25 MG tablet Take 25 mg by mouth daily.    . insulin aspart protamine-insulin aspart (NOVOLOG 70/30) (70-30) 100 UNIT/ML injection Inject 60 Units into the  skin 2 (two) times daily with a meal.     . INVOKAMET 150-500 MG TABS Take 1 tablet by mouth 2 (two) times daily.  5  . LEVEMIR FLEXTOUCH 100 UNIT/ML Pen Inject 10 Units into the skin at bedtime.  5  . losartan (COZAAR) 100 MG tablet Take 100 mg by mouth daily.    . metoprolol succinate (TOPROL XL) 25 MG 24 hr tablet Take 1 tablet (25 mg total) by mouth daily. 90 tablet 3  . pantoprazole (PROTONIX) 40 MG tablet Take 40 mg by mouth daily.    . Potassium Gluconate 595 MG TBCR Take 1 tablet by mouth daily.     No current facility-administered medications for this visit.    Allergies:   Review of patient's allergies indicates no known allergies.    Social History:  The patient  reports that she has never smoked. She has never used smokeless tobacco. She reports that she drinks alcohol. She reports that she does not use illicit drugs.   Family History:  The patient's family history includes Coronary artery disease in her father; Diabetes in her brother, maternal grandmother, and mother;  Hypertension in her sister; Lung cancer in her mother.    ROS:  Please see the history of present illness.   Otherwise, review of systems are positive for none.   All other systems are reviewed and negative.    PHYSICAL EXAM: VS:  BP 121/51 mmHg  Pulse 65  Ht 5\' 6"  (1.676 m)  Wt 251 lb (113.853 kg)  BMI 40.53 kg/m2  SpO2 95% , BMI Body mass index is 40.53 kg/(m^2). GEN: Well nourished, well developed, in no acute distress HEENT: normal Neck: no JVD, carotid bruits, or masses Cardiac: RRR; no murmurs, rubs, or gallops,no edema  Respiratory:  clear to auscultation bilaterally, normal work of breathing GI: soft, nontender, nondistended, + BS MS: no deformity or atrophy Skin: warm and dry, no rash Neuro:  Strength and sensation are intact Psych: euthymic mood, full affect   EKG:  EKG is not ordered today.    Recent Labs: No results found for requested labs within last 365 days.    Lipid  Panel No results found for: CHOL, TRIG, HDL, CHOLHDL, VLDL, LDLCALC, LDLDIRECT    Wt Readings from Last 3 Encounters:  11/08/14 251 lb (113.853 kg)  10/29/14 250 lb 6.4 oz (113.581 kg)  06/18/14 248 lb 9.6 oz (112.764 kg)     ASSESSMENT AND PLAN:  1. OSA on CPAP and tolerating well - her download today showed an AHI of 0.3/hr and 76% compliance in using more than 4 hours nightly. Continue on CPAP at 10cm H2O. 2. HTN - well controlled - continue Amlodipine/Losartan/HCTZ/BB 3. Obesity - encouraged her to continue walking as her leg pain tolerates.    Current medicines are reviewed at length with the patient today.  The patient does not have concerns regarding medicines.  The following changes have been made:  no change  Labs/ tests ordered today: See above Assessment and Plan No orders of the defined types were placed in this encounter.     Disposition:   FU with me in 1 year  Signed, Victoria Margarita, MD  11/08/2014 11:09 AM    [facility] [place], [place], [place]  [address] Phone: [phone]; Fax: [phone]

## 2014-11-08 ENCOUNTER — Ambulatory Visit (INDEPENDENT_AMBULATORY_CARE_PROVIDER_SITE_OTHER): Payer: PRIVATE HEALTH INSURANCE | Admitting: Cardiology

## 2014-11-08 ENCOUNTER — Encounter: Payer: Self-pay | Admitting: Cardiology

## 2014-11-08 VITALS — BP 121/51 | HR 65 | Ht 66.0 in | Wt 251.0 lb

## 2014-11-08 DIAGNOSIS — I10 Essential (primary) hypertension: Secondary | ICD-10-CM

## 2014-11-08 DIAGNOSIS — G4733 Obstructive sleep apnea (adult) (pediatric): Secondary | ICD-10-CM | POA: Diagnosis not present

## 2014-11-08 DIAGNOSIS — E669 Obesity, unspecified: Secondary | ICD-10-CM

## 2014-11-08 NOTE — Patient Instructions (Signed)

## 2014-12-07 ENCOUNTER — Encounter: Payer: Self-pay | Admitting: Cardiology

## 2014-12-20 ENCOUNTER — Ambulatory Visit (AMBULATORY_SURGERY_CENTER): Payer: Self-pay

## 2014-12-20 VITALS — Ht 66.0 in | Wt 250.8 lb

## 2014-12-20 DIAGNOSIS — Z1211 Encounter for screening for malignant neoplasm of colon: Secondary | ICD-10-CM

## 2014-12-20 MED ORDER — SUPREP BOWEL PREP KIT 17.5-3.13-1.6 GM/177ML PO SOLN: 1.0000 | Freq: Once | ORAL | Status: DC

## 2014-12-20 NOTE — Progress Notes (Signed)
No allegies to eggs or soy No diet/weight loss meds No past problems with anesthesia No home oxygen (CPAP only)  Has email and internet; refused emmi

## 2015-01-01 ENCOUNTER — Ambulatory Visit (AMBULATORY_SURGERY_CENTER): Payer: PRIVATE HEALTH INSURANCE | Admitting: Gastroenterology

## 2015-01-01 ENCOUNTER — Encounter: Payer: Self-pay | Admitting: Gastroenterology

## 2015-01-01 VITALS — BP 160/67 | HR 57 | Temp 96.5°F | Resp 36 | Ht 66.0 in | Wt 250.0 lb

## 2015-01-01 DIAGNOSIS — Z1211 Encounter for screening for malignant neoplasm of colon: Secondary | ICD-10-CM

## 2015-01-01 DIAGNOSIS — D123 Benign neoplasm of transverse colon: Secondary | ICD-10-CM

## 2015-01-01 DIAGNOSIS — D125 Benign neoplasm of sigmoid colon: Secondary | ICD-10-CM

## 2015-01-01 LAB — GLUCOSE, CAPILLARY
GLUCOSE-CAPILLARY: 102 mg/dL — AB (ref 65–99)
Glucose-Capillary: 127 mg/dL — ABNORMAL HIGH (ref 65–99)

## 2015-01-01 MED ORDER — SODIUM CHLORIDE 0.9 % IV SOLN: 500.0000 mL | INTRAVENOUS | Status: DC

## 2015-01-01 NOTE — Patient Instructions (Signed)
YOU HAD AN ENDOSCOPIC PROCEDURE TODAY AT [facility]:   Refer to the procedure report that was given to you for any specific questions about what was found during the examination.  If the procedure report does not answer your questions, please call your gastroenterologist to clarify.  If you requested that your care partner not be given the details of your procedure findings, then the procedure report has been included in a sealed envelope for you to review at your convenience later.  YOU SHOULD EXPECT: Some feelings of bloating in the abdomen. Passage of more gas than usual.  Walking can help get rid of the air that was put into your GI tract during the procedure and reduce the bloating. If you had a lower endoscopy (such as a colonoscopy or flexible sigmoidoscopy) you may notice spotting of blood in your stool or on the toilet paper. If you underwent a bowel prep for your procedure, you may not have a normal bowel movement for a few days.  Please Note:  You might notice some irritation and congestion in your nose or some drainage.  This is from the oxygen used during your procedure.  There is no need for concern and it should clear up in a day or so.  SYMPTOMS TO REPORT IMMEDIATELY:   Following lower endoscopy (colonoscopy or flexible sigmoidoscopy):  Excessive amounts of blood in the stool  Significant tenderness or worsening of abdominal pains  Swelling of the abdomen that is new, acute  Fever of 100F or higher    For urgent or emergent issues, a gastroenterologist can be reached at any hour by calling [phone].   DIET: Your first meal following the procedure should be a small meal and then it is ok to progress to your normal diet. Heavy or fried foods are harder to digest and may make you feel nauseous or bloated.  Likewise, meals heavy in dairy and vegetables can increase bloating.  Drink plenty of fluids but you should avoid alcoholic beverages for 24  hours.  ACTIVITY:  You should plan to take it easy for the rest of today and you should NOT DRIVE or use heavy machinery until tomorrow (because of the sedation medicines used during the test).    FOLLOW UP: Our staff will call the number listed on your records the next business day following your procedure to check on you and address any questions or concerns that you may have regarding the information given to you following your procedure. If we do not reach you, we will leave a message.  However, if you are feeling well and you are not experiencing any problems, there is no need to return our call.  We will assume that you have returned to your regular daily activities without incident.  If any biopsies were taken you will be contacted by phone or by letter within the next 1-3 weeks.  Please call us at [phone] if you have not heard about the biopsies in 3 weeks.    SIGNATURES/CONFIDENTIALITY: You and/or your care partner have signed paperwork which will be entered into your electronic medical record.  These signatures attest to the fact that that the information above on your After Visit Summary has been reviewed and is understood.  Full responsibility of the confidentiality of this discharge information lies with you and/or your care-partner.   Handout was given to your care partner on polyps. Your blood sugar was 102 in the recovery room. You may resume your  current medications today. Await biopsy results. Please call if any questions or concerns.

## 2015-01-01 NOTE — Op Note (Signed)
[facility]  [address]. [place], [id number]   COLONOSCOPY PROCEDURE REPORT  PATIENT: Victoria Jimenez, Victoria Jimenez  MR#: [id number] BIRTHDATE: 02-01-1951 , 34  yrs. old GENDER: female ENDOSCOPIST: Milus Banister, MD REFERRED [id number] Bl[place], M.D. PROCEDURE DATE:  01/01/2015 PROCEDURE:   Colonoscopy, screening and Colonoscopy with snare polypectomy First Screening Colonoscopy - Avg.  risk and is 50 yrs.  old or older Yes.  Prior Negative Screening - Now for repeat screening. N/A  History of Adenoma - Now for follow-up colonoscopy & has been > or = to 3 yrs.  N/A  Polyps removed today? Yes ASA CLASS:   Class II INDICATIONS:Screening for colonic neoplasia and Colorectal Neoplasm Risk Assessment for this procedure is average risk. MEDICATIONS: Monitored anesthesia care and Propofol 300 mg IV  DESCRIPTION OF PROCEDURE:   After the risks benefits and alternatives of the procedure were thoroughly explained, informed consent was obtained.  The digital rectal exam revealed no abnormalities of the rectum.   The LB [id number]  endoscope was introduced through the anus and advanced to the cecum, which was identified by both the appendix and ileocecal valve. No adverse events experienced.   The quality of the prep was good.  The instrument was then slowly withdrawn as the colon was fully examined. Estimated blood loss is zero unless otherwise noted in this procedure report.   COLON FINDINGS: A sessile polyp measuring 4 mm in size was found in the transverse colon.  A polypectomy was performed with a cold snare.  The resection was complete, the polyp tissue was completely retrieved and sent to histology.   A pedunculated polyp measuring 12 mm in size was found in the sigmoid colon.  A polypectomy was performed using snare cautery.  The resection was complete, the polyp tissue was completely retrieved and sent to histology.   The examination was otherwise normal.   Retroflexed views revealed no abnormalities. The time to cecum = 14.0 Withdrawal time = 8.3   The scope was withdrawn and the procedure completed. COMPLICATIONS: There were no immediate complications.  ENDOSCOPIC IMPRESSION: 1.   Sessile polyp was found in the transverse colon; polypectomy was performed with a cold snare (jar 1) 2.   Pedunculated polyp was found in the sigmoid colon; polypectomy was performed using snare cautery (jar 2) 3.   The examination was otherwise normal  RECOMMENDATIONS: If the polyp(s) removed today are proven to be adenomatous (pre-cancerous) polyps, you will need a colonoscopy in 3 years. Otherwise you should continue to follow colorectal cancer screening guidelines for "routine risk" patients with a colonoscopy in 10 years.  You will receive a letter within 1-2 weeks with the results of your biopsy as well as final recommendations.  Please call my office if you have not received a letter after 3 weeks.  eSigned:  Milus Banister, MD 01/01/2015 9:41 AM

## 2015-01-01 NOTE — Progress Notes (Signed)
No problems noted in the recovery room. maw 

## 2015-01-01 NOTE — Progress Notes (Signed)
Called to room to assist during endoscopic procedure.  Patient ID and intended procedure confirmed with present staff. Received instructions for my participation in the procedure from the performing physician.  

## 2015-01-01 NOTE — Progress Notes (Signed)
A/ox3 pleased with MAC, report to [facility] RN C/o lip edema, noted very small reddened areas, minimal swelling, noted  Dr. Ardis Hughs aware

## 2015-01-02 ENCOUNTER — Telehealth: Payer: Self-pay

## 2015-01-02 NOTE — Telephone Encounter (Signed)
  Follow up Call-  Call back number 01/01/2015  Post procedure Call Back phone  # [phone]  Permission to leave phone message Yes     Patient questions:  Do you have a fever, pain , or abdominal swelling? No. Pain Score  0 *  Have you tolerated food without any problems? Yes.    Have you been able to return to your normal activities? Yes.    Do you have any questions about your discharge instructions: Diet   No. Medications  No. Follow up visit  No.  Do you have questions or concerns about your Care? No.  Actions: * If pain score is 4 or above: No action needed, pain <4.

## 2015-01-08 ENCOUNTER — Encounter: Payer: Self-pay | Admitting: Gastroenterology

## 2015-01-28 ENCOUNTER — Emergency Department (HOSPITAL_COMMUNITY)
Admission: EM | Admit: 2015-01-28 | Discharge: 2015-01-28 | Disposition: A | Discharge status: Home / Self Care | Payer: PRIVATE HEALTH INSURANCE | Attending: Emergency Medicine | Admitting: Emergency Medicine

## 2015-01-28 ENCOUNTER — Encounter (HOSPITAL_COMMUNITY): Payer: Self-pay | Admitting: Emergency Medicine

## 2015-01-28 DIAGNOSIS — E785 Hyperlipidemia, unspecified: Secondary | ICD-10-CM | POA: Diagnosis not present

## 2015-01-28 DIAGNOSIS — Z7982 Long term (current) use of aspirin: Secondary | ICD-10-CM | POA: Diagnosis not present

## 2015-01-28 DIAGNOSIS — K219 Gastro-esophageal reflux disease without esophagitis: Secondary | ICD-10-CM | POA: Insufficient documentation

## 2015-01-28 DIAGNOSIS — Z9861 Coronary angioplasty status: Secondary | ICD-10-CM | POA: Insufficient documentation

## 2015-01-28 DIAGNOSIS — G4733 Obstructive sleep apnea (adult) (pediatric): Secondary | ICD-10-CM | POA: Diagnosis not present

## 2015-01-28 DIAGNOSIS — J029 Acute pharyngitis, unspecified: Secondary | ICD-10-CM | POA: Insufficient documentation

## 2015-01-28 DIAGNOSIS — E119 Type 2 diabetes mellitus without complications: Secondary | ICD-10-CM | POA: Diagnosis not present

## 2015-01-28 DIAGNOSIS — Z9981 Dependence on supplemental oxygen: Secondary | ICD-10-CM | POA: Diagnosis not present

## 2015-01-28 DIAGNOSIS — Z9889 Other specified postprocedural states: Secondary | ICD-10-CM | POA: Diagnosis not present

## 2015-01-28 DIAGNOSIS — Z794 Long term (current) use of insulin: Secondary | ICD-10-CM | POA: Insufficient documentation

## 2015-01-28 DIAGNOSIS — I2510 Atherosclerotic heart disease of native coronary artery without angina pectoris: Secondary | ICD-10-CM | POA: Insufficient documentation

## 2015-01-28 DIAGNOSIS — Z8619 Personal history of other infectious and parasitic diseases: Secondary | ICD-10-CM | POA: Diagnosis not present

## 2015-01-28 DIAGNOSIS — I10 Essential (primary) hypertension: Secondary | ICD-10-CM | POA: Insufficient documentation

## 2015-01-28 DIAGNOSIS — Z79899 Other long term (current) drug therapy: Secondary | ICD-10-CM | POA: Insufficient documentation

## 2015-01-28 DIAGNOSIS — Z7984 Long term (current) use of oral hypoglycemic drugs: Secondary | ICD-10-CM | POA: Diagnosis not present

## 2015-01-28 LAB — RAPID STREP SCREEN (MED CTR MEBANE ONLY): Streptococcus, Group A Screen (Direct): NEGATIVE

## 2015-01-28 MED ORDER — DEXAMETHASONE SODIUM PHOSPHATE 10 MG/ML IJ SOLN
10.0000 mg | Freq: Once | INTRAMUSCULAR | Status: AC
  Administered 2015-01-28: 10 mg via INTRAMUSCULAR
  Filled 2015-01-28: qty 1

## 2015-01-28 NOTE — ED Notes (Signed)
Pt in reporting sore throat for past few days. Denies any other S/S

## 2015-01-28 NOTE — ED Provider Notes (Signed)
CSN: [id number]     Arrival date & time 01/28/15  2202 History  By signing my name below, I, Soijett Blue, attest that this documentation has been prepared under the direction and in the presence of Glendell Docker, NP Electronically Signed: Soijett Blue, ED Scribe. 01/28/2015. 10:11 PM.   Chief Complaint  Patient presents with  . Sore Throat      The history is provided by the patient. No language interpreter was used.    HPI Comments: Victoria Jimenez is a 64 y.o. female with a PMHx of HTN, DM, and herpes who presents to the Emergency Department complaining of 7/10 sore throat worsened with cough onset 2 weeks. She states that she is having associated symptoms of voice change and cough. She states that she has tried robitussin, OTC cough medications, and throat lozenges with no relief for her symptoms. She denies fever, chills, and any other symptoms. Denies sick contacts. Denies allergies to medications. She notes that she does have a PCP and she recently saw him and she didn't discuss what was going on with her at the time. Tolerating secretions without any problems    Past Medical History  Diagnosis Date  . Hypertension   . Diabetes mellitus without complication ([place])     Type II  . Hyperlipidemia   . Herpes simplex   . GERD (gastroesophageal reflux disease)   . Allergic rhinitis   . OSA (obstructive sleep apnea)     on CPAP  . Coronary artery disease     Treated by Angioplasty  . Sleep apnea     wears cpap nightly   Past Surgical History  Procedure Laterality Date  . Abdominal hysterectomy    . Angioplasty    . Tubal ligation    . Cardiac catheterization     Family History  Problem Relation Age of Onset  . Coronary artery disease Father   . Lung cancer Mother   . Diabetes Mother   . Hypertension Sister   . Diabetes Brother   . Diabetes Maternal Grandmother   . Colon cancer Neg Hx    Social History  Substance Use Topics  . Smoking status: Never Smoker    . Smokeless tobacco: Never Used  . Alcohol Use: 0.0 oz/week    0 Standard drinks or equivalent per week     Comment: rare   OB History    No data available     Review of Systems  Constitutional: Negative for fever.  HENT: Positive for sore throat. Negative for voice change.   Respiratory: Positive for cough.       Allergies  Review of patient's allergies indicates no known allergies.  Home Medications   Prior to Admission medications   Medication Sig Start Date End Date Taking? Authorizing Provider  acetaminophen (TYLENOL) 325 MG tablet Take 650 mg by mouth every 6 (six) hours as needed.    Historical Provider, MD  amLODipine-atorvastatin (CADUET) 10-40 MG per tablet Take 1 tablet by mouth daily.    Historical Provider, MD  aspirin EC 81 MG tablet Take 81 mg by mouth daily.    Historical Provider, MD  CINNAMON PO Take 2 tablets by mouth 2 (two) times daily.    Historical Provider, MD  CRANBERRY PO Take 1 tablet by mouth daily.     Historical Provider, MD  fish oil-omega-3 fatty acids 1000 MG capsule Take 1 g by mouth 2 (two) times daily.    Historical Provider, MD  Flaxseed, Linseed, (FLAX SEED  OIL PO) Take 1 capsule by mouth 2 (two) times daily.    Historical Provider, MD  hydrochlorothiazide (HYDRODIURIL) 25 MG tablet Take 25 mg by mouth daily.    Historical Provider, MD  insulin aspart protamine-insulin aspart (NOVOLOG 70/30) (70-30) 100 UNIT/ML injection Inject 60 Units into the skin 2 (two) times daily with a meal.     Historical Provider, MD  INVOKAMET 150-500 MG TABS Take 1 tablet by mouth 2 (two) times daily. 03/26/14   Historical Provider, MD  LEVEMIR FLEXTOUCH 100 UNIT/ML Pen Inject 10 Units into the skin at bedtime. 10/23/14   Historical Provider, MD  losartan (COZAAR) 100 MG tablet Take 100 mg by mouth daily. 12/29/14   Historical Provider, MD  metoprolol succinate (TOPROL XL) 25 MG 24 hr tablet Take 1 tablet (25 mg total) by mouth daily. 06/20/14   Belva Crome, MD   pantoprazole (PROTONIX) 40 MG tablet Take 40 mg by mouth daily.    Historical Provider, MD  Potassium Gluconate 595 MG TBCR Take 1 tablet by mouth daily.    Historical Provider, MD   There were no vitals taken for this visit. Physical Exam  Constitutional: She is oriented to person, place, and time. She appears well-developed and well-nourished. No distress.  HENT:  Head: Normocephalic and atraumatic.  Right Ear: Tympanic membrane, external ear and ear canal normal.  Left Ear: Tympanic membrane and ear canal normal.  Mouth/Throat: Uvula is midline and mucous membranes are normal. Posterior oropharyngeal erythema present. No oropharyngeal exudate or posterior oropharyngeal edema.  Eyes: EOM are normal.  Neck: Neck supple.  Cardiovascular: Normal rate, regular rhythm and normal heart sounds.  Exam reveals no gallop and no friction rub.   No murmur heard. Pulmonary/Chest: Effort normal and breath sounds normal. No respiratory distress. She has no wheezes. She has no rales.  Musculoskeletal: Normal range of motion.  Lymphadenopathy:    She has no cervical adenopathy.  Neurological: She is alert and oriented to person, place, and time.  Skin: Skin is warm and dry.  Psychiatric: She has a normal mood and affect. Her behavior is normal.  Nursing note and vitals reviewed.   ED Course  Procedures (including critical care time) DIAGNOSTIC STUDIES: Oxygen Saturation is 99% on RA, nl by my interpretation.    COORDINATION OF CARE: 10:10 PM Discussed treatment plan with pt at bedside which includes rapid strep screen and pt agreed to plan.    Labs Review Labs Reviewed  RAPID STREP SCREEN (NOT AT [facility])  CULTURE, GROUP A STREP    Imaging Review No results found. I have personally reviewed and evaluated these lab results as part of my medical decision-making.   EKG Interpretation None      MDM   Final diagnoses:  Pharyngitis    Strep negative. Given shot of decadron for  symptomatic relief. Tolerating secretions without any problem. Lungs clear. Discussed with pt that blood sugar may be elevated. Pt told to follow up for continued symptoms with pcp  I personally performed the services described in this documentation, which was scribed in my presence. The recorded information has been reviewed and is accurate.    Glendell Docker, NP 01/28/15 [place], MD 01/29/15 [phone]

## 2015-01-28 NOTE — Discharge Instructions (Signed)

## 2015-01-28 NOTE — ED Notes (Signed)
See EDP assessment 

## 2015-01-31 LAB — CULTURE, GROUP A STREP

## 2015-02-12 ENCOUNTER — Other Ambulatory Visit: Payer: Self-pay

## 2015-02-12 DIAGNOSIS — Z1231 Encounter for screening mammogram for malignant neoplasm of breast: Secondary | ICD-10-CM

## 2015-03-06 ENCOUNTER — Ambulatory Visit
Admission: RE | Admit: 2015-03-06 | Discharge: 2015-03-06 | Disposition: A | Discharge status: Home / Self Care | Payer: PRIVATE HEALTH INSURANCE | Source: Ambulatory Visit

## 2015-03-06 DIAGNOSIS — Z1231 Encounter for screening mammogram for malignant neoplasm of breast: Secondary | ICD-10-CM

## 2015-06-26 ENCOUNTER — Other Ambulatory Visit: Payer: Self-pay | Admitting: *Deleted

## 2015-06-26 MED ORDER — METOPROLOL SUCCINATE ER 25 MG PO TB24: 25.0000 mg | ORAL_TABLET | Freq: Every day | ORAL | Status: DC

## 2015-09-01 ENCOUNTER — Encounter (HOSPITAL_COMMUNITY): Payer: Self-pay

## 2015-09-01 ENCOUNTER — Inpatient Hospital Stay (HOSPITAL_COMMUNITY)
Admission: EM | Admit: 2015-09-01 | Discharge: 2015-09-05 | DRG: 872 | Disposition: A | Discharge status: Home / Self Care | Payer: Medicaid Other | Attending: Internal Medicine | Admitting: Internal Medicine

## 2015-09-01 ENCOUNTER — Emergency Department (HOSPITAL_COMMUNITY): Payer: Medicaid Other

## 2015-09-01 DIAGNOSIS — K219 Gastro-esophageal reflux disease without esophagitis: Secondary | ICD-10-CM | POA: Diagnosis present

## 2015-09-01 DIAGNOSIS — I10 Essential (primary) hypertension: Secondary | ICD-10-CM | POA: Diagnosis not present

## 2015-09-01 DIAGNOSIS — I2510 Atherosclerotic heart disease of native coronary artery without angina pectoris: Secondary | ICD-10-CM | POA: Diagnosis present

## 2015-09-01 DIAGNOSIS — G5702 Lesion of sciatic nerve, left lower limb: Secondary | ICD-10-CM | POA: Diagnosis present

## 2015-09-01 DIAGNOSIS — A419 Sepsis, unspecified organism: Secondary | ICD-10-CM | POA: Diagnosis not present

## 2015-09-01 DIAGNOSIS — Z90710 Acquired absence of both cervix and uterus: Secondary | ICD-10-CM

## 2015-09-01 DIAGNOSIS — Z7982 Long term (current) use of aspirin: Secondary | ICD-10-CM

## 2015-09-01 DIAGNOSIS — IMO0001 Reserved for inherently not codable concepts without codable children: Secondary | ICD-10-CM

## 2015-09-01 DIAGNOSIS — G4733 Obstructive sleep apnea (adult) (pediatric): Secondary | ICD-10-CM | POA: Diagnosis present

## 2015-09-01 DIAGNOSIS — Z955 Presence of coronary angioplasty implant and graft: Secondary | ICD-10-CM

## 2015-09-01 DIAGNOSIS — E1140 Type 2 diabetes mellitus with diabetic neuropathy, unspecified: Secondary | ICD-10-CM | POA: Diagnosis present

## 2015-09-01 DIAGNOSIS — Z6837 Body mass index (BMI) 37.0-37.9, adult: Secondary | ICD-10-CM

## 2015-09-01 DIAGNOSIS — E785 Hyperlipidemia, unspecified: Secondary | ICD-10-CM | POA: Diagnosis present

## 2015-09-01 DIAGNOSIS — E119 Type 2 diabetes mellitus without complications: Secondary | ICD-10-CM

## 2015-09-01 DIAGNOSIS — J309 Allergic rhinitis, unspecified: Secondary | ICD-10-CM | POA: Diagnosis present

## 2015-09-01 DIAGNOSIS — Z79899 Other long term (current) drug therapy: Secondary | ICD-10-CM

## 2015-09-01 DIAGNOSIS — E669 Obesity, unspecified: Secondary | ICD-10-CM | POA: Diagnosis present

## 2015-09-01 DIAGNOSIS — N390 Urinary tract infection, site not specified: Secondary | ICD-10-CM | POA: Diagnosis present

## 2015-09-01 DIAGNOSIS — A4151 Sepsis due to Escherichia coli [E. coli]: Principal | ICD-10-CM | POA: Diagnosis present

## 2015-09-01 DIAGNOSIS — B373 Candidiasis of vulva and vagina: Secondary | ICD-10-CM | POA: Diagnosis present

## 2015-09-01 DIAGNOSIS — Z794 Long term (current) use of insulin: Secondary | ICD-10-CM

## 2015-09-01 DIAGNOSIS — B3731 Acute candidiasis of vulva and vagina: Secondary | ICD-10-CM | POA: Diagnosis present

## 2015-09-01 DIAGNOSIS — Z8249 Family history of ischemic heart disease and other diseases of the circulatory system: Secondary | ICD-10-CM

## 2015-09-01 LAB — CBC WITH DIFFERENTIAL/PLATELET
Basophils Absolute: 0 10*3/uL (ref 0.0–0.1)
Basophils Relative: 0 %
EOS PCT: 2 %
Eosinophils Absolute: 0.1 10*3/uL (ref 0.0–0.7)
HCT: 46.2 % — ABNORMAL HIGH (ref 36.0–46.0)
Hemoglobin: 14.7 g/dL (ref 12.0–15.0)
LYMPHS ABS: 0.7 10*3/uL (ref 0.7–4.0)
LYMPHS PCT: 12 %
MCH: 26.8 pg (ref 26.0–34.0)
MCHC: 31.8 g/dL (ref 30.0–36.0)
MCV: 84.3 fL (ref 78.0–100.0)
MONO ABS: 0 10*3/uL — AB (ref 0.1–1.0)
MONOS PCT: 0 %
Neutro Abs: 5.2 10*3/uL (ref 1.7–7.7)
Neutrophils Relative %: 86 %
PLATELETS: 258 10*3/uL (ref 150–400)
RBC: 5.48 MIL/uL — ABNORMAL HIGH (ref 3.87–5.11)
RDW: 13.8 % (ref 11.5–15.5)
WBC: 6.1 10*3/uL (ref 4.0–10.5)

## 2015-09-01 LAB — URINE MICROSCOPIC-ADD ON

## 2015-09-01 LAB — URINALYSIS, ROUTINE W REFLEX MICROSCOPIC
Bilirubin Urine: NEGATIVE
KETONES UR: NEGATIVE mg/dL
Nitrite: POSITIVE — AB
PH: 6 (ref 5.0–8.0)
PROTEIN: 30 mg/dL — AB
Specific Gravity, Urine: 1.018 (ref 1.005–1.030)

## 2015-09-01 LAB — GLUCOSE, CAPILLARY: GLUCOSE-CAPILLARY: 97 mg/dL (ref 65–99)

## 2015-09-01 LAB — COMPREHENSIVE METABOLIC PANEL
ALBUMIN: 4.2 g/dL (ref 3.5–5.0)
ALT: 21 U/L (ref 14–54)
AST: 27 U/L (ref 15–41)
Alkaline Phosphatase: 89 U/L (ref 38–126)
Anion gap: 12 (ref 5–15)
BILIRUBIN TOTAL: 0.9 mg/dL (ref 0.3–1.2)
BUN: 17 mg/dL (ref 6–20)
CALCIUM: 9.4 mg/dL (ref 8.9–10.3)
CO2: 24 mmol/L (ref 22–32)
Chloride: 101 mmol/L (ref 101–111)
Creatinine, Ser: 1.06 mg/dL — ABNORMAL HIGH (ref 0.44–1.00)
GFR calc Af Amer: >60 mL/min (ref >60)
GFR, EST NON AFRICAN AMERICAN: 55 mL/min — AB (ref >60)
Glucose, Bld: 144 mg/dL — ABNORMAL HIGH (ref 65–99)
POTASSIUM: 3.9 mmol/L (ref 3.5–5.1)
SODIUM: 137 mmol/L (ref 135–145)
TOTAL PROTEIN: 8 g/dL (ref 6.5–8.1)

## 2015-09-01 LAB — CBG MONITORING, ED: Glucose-Capillary: 152 mg/dL — ABNORMAL HIGH (ref 65–99)

## 2015-09-01 LAB — PROTIME-INR
INR: 1.07 (ref 0.00–1.49)
Prothrombin Time: 14.1 seconds (ref 11.6–15.2)

## 2015-09-01 LAB — LACTIC ACID, PLASMA: Lactic Acid, Venous: 2 mmol/L (ref 0.5–1.9)

## 2015-09-01 LAB — I-STAT CG4 LACTIC ACID, ED
Lactic Acid, Venous: 4.66 mmol/L (ref 0.5–1.9)
Lactic Acid, Venous: 2.55 mmol/L (ref 0.5–1.9)

## 2015-09-01 LAB — PROCALCITONIN: PROCALCITONIN: 40.18 ng/mL

## 2015-09-01 LAB — APTT: aPTT: 29 seconds (ref 24–37)

## 2015-09-01 LAB — I-STAT BETA HCG BLOOD, ED (MC, WL, AP ONLY)

## 2015-09-01 MED ORDER — CLOTRIMAZOLE 1 % VA CREA
1.0000 | TOPICAL_CREAM | Freq: Every day | VAGINAL | Status: DC
  Administered 2015-09-01 – 2015-09-04 (×4): 1 via VAGINAL
  Filled 2015-09-01: qty 45

## 2015-09-01 MED ORDER — AMLODIPINE-ATORVASTATIN 10-40 MG PO TABS: 1.0000 | ORAL_TABLET | Freq: Every day | ORAL | Status: DC

## 2015-09-01 MED ORDER — SODIUM CHLORIDE 0.9 % IV SOLN
INTRAVENOUS | Status: AC
  Administered 2015-09-01: 22:00:00 via INTRAVENOUS

## 2015-09-01 MED ORDER — INSULIN ASPART 100 UNIT/ML ~~LOC~~ SOLN
4.0000 [IU] | Freq: Three times a day (TID) | SUBCUTANEOUS | Status: DC
  Administered 2015-09-02 – 2015-09-05 (×10): 4 [IU] via SUBCUTANEOUS

## 2015-09-01 MED ORDER — SODIUM CHLORIDE 0.9 % IV BOLUS (SEPSIS)
1000.0000 mL | Freq: Once | INTRAVENOUS | Status: AC
  Administered 2015-09-01: 1000 mL via INTRAVENOUS

## 2015-09-01 MED ORDER — BISACODYL 5 MG PO TBEC
5.0000 mg | DELAYED_RELEASE_TABLET | Freq: Every day | ORAL | Status: DC | PRN
  Administered 2015-09-04: 5 mg via ORAL
  Filled 2015-09-01 (×2): qty 1

## 2015-09-01 MED ORDER — SODIUM CHLORIDE 0.9% FLUSH
3.0000 mL | Freq: Two times a day (BID) | INTRAVENOUS | Status: DC
  Administered 2015-09-02 – 2015-09-04 (×6): 3 mL via INTRAVENOUS

## 2015-09-01 MED ORDER — INSULIN ASPART 100 UNIT/ML ~~LOC~~ SOLN
0.0000 [IU] | Freq: Three times a day (TID) | SUBCUTANEOUS | Status: DC
  Administered 2015-09-02 – 2015-09-03 (×3): 3 [IU] via SUBCUTANEOUS
  Administered 2015-09-04 (×3): 2 [IU] via SUBCUTANEOUS

## 2015-09-01 MED ORDER — MELOXICAM 7.5 MG PO TABS
15.0000 mg | ORAL_TABLET | Freq: Every day | ORAL | Status: DC
  Administered 2015-09-02 – 2015-09-05 (×4): 15 mg via ORAL
  Filled 2015-09-01 (×3): qty 2
  Filled 2015-09-01: qty 1

## 2015-09-01 MED ORDER — ACETAMINOPHEN 325 MG PO TABS
650.0000 mg | ORAL_TABLET | Freq: Four times a day (QID) | ORAL | Status: DC | PRN
  Administered 2015-09-01: 650 mg via ORAL
  Filled 2015-09-01: qty 2

## 2015-09-01 MED ORDER — SODIUM CHLORIDE 0.9 % IV BOLUS (SEPSIS)
500.0000 mL | Freq: Once | INTRAVENOUS | Status: AC
  Administered 2015-09-01: 500 mL via INTRAVENOUS

## 2015-09-01 MED ORDER — ACETAMINOPHEN 650 MG RE SUPP: 650.0000 mg | Freq: Four times a day (QID) | RECTAL | Status: DC | PRN

## 2015-09-01 MED ORDER — HYDROCODONE-ACETAMINOPHEN 5-325 MG PO TABS
1.0000 | ORAL_TABLET | ORAL | Status: DC | PRN
  Administered 2015-09-02: 2 via ORAL
  Administered 2015-09-02: 1 via ORAL
  Administered 2015-09-03: 2 via ORAL
  Filled 2015-09-01: qty 2
  Filled 2015-09-01: qty 1
  Filled 2015-09-01: qty 2

## 2015-09-01 MED ORDER — SODIUM CHLORIDE 0.9 % IV SOLN: INTRAVENOUS | Status: DC

## 2015-09-01 MED ORDER — OLOPATADINE HCL 0.1 % OP SOLN: 1.0000 [drp] | Freq: Two times a day (BID) | OPHTHALMIC | Status: DC

## 2015-09-01 MED ORDER — ASPIRIN EC 81 MG PO TBEC
81.0000 mg | DELAYED_RELEASE_TABLET | Freq: Every day | ORAL | Status: DC
  Administered 2015-09-02 – 2015-09-05 (×4): 81 mg via ORAL
  Filled 2015-09-01 (×4): qty 1

## 2015-09-01 MED ORDER — PANTOPRAZOLE SODIUM 40 MG PO TBEC
40.0000 mg | DELAYED_RELEASE_TABLET | Freq: Every day | ORAL | Status: DC
  Administered 2015-09-02 – 2015-09-05 (×4): 40 mg via ORAL
  Filled 2015-09-01 (×4): qty 1

## 2015-09-01 MED ORDER — ONDANSETRON HCL 4 MG/2ML IJ SOLN: 4.0000 mg | Freq: Four times a day (QID) | INTRAMUSCULAR | Status: DC | PRN

## 2015-09-01 MED ORDER — AMLODIPINE BESYLATE 10 MG PO TABS
10.0000 mg | ORAL_TABLET | Freq: Every day | ORAL | Status: DC
  Administered 2015-09-02 – 2015-09-05 (×4): 10 mg via ORAL
  Filled 2015-09-01 (×4): qty 1

## 2015-09-01 MED ORDER — DEXTROSE 5 % IV SOLN
2.0000 g | Freq: Once | INTRAVENOUS | Status: AC
  Administered 2015-09-01: 2 g via INTRAVENOUS
  Filled 2015-09-01: qty 2

## 2015-09-01 MED ORDER — INSULIN DETEMIR 100 UNIT/ML ~~LOC~~ SOLN
5.0000 [IU] | Freq: Every day | SUBCUTANEOUS | Status: DC
  Administered 2015-09-01 – 2015-09-04 (×4): 5 [IU] via SUBCUTANEOUS
  Filled 2015-09-01 (×5): qty 0.05

## 2015-09-01 MED ORDER — DEXTROSE 5 % IV SOLN
2.0000 g | INTRAVENOUS | Status: DC
  Administered 2015-09-02 – 2015-09-05 (×4): 2 g via INTRAVENOUS
  Filled 2015-09-01 (×5): qty 2

## 2015-09-01 MED ORDER — SODIUM CHLORIDE 0.9 % IV BOLUS (SEPSIS)
1000.0000 mL | Freq: Once | INTRAVENOUS | Status: AC
Start: 2015-09-01 — End: 2015-09-01
  Administered 2015-09-01: 1000 mL via INTRAVENOUS

## 2015-09-01 MED ORDER — ONDANSETRON 4 MG PO TBDP
4.0000 mg | ORAL_TABLET | Freq: Once | ORAL | Status: AC | PRN
  Administered 2015-09-01: 4 mg via ORAL

## 2015-09-01 MED ORDER — POLYETHYLENE GLYCOL 3350 17 G PO PACK
17.0000 g | PACK | Freq: Every day | ORAL | Status: DC | PRN
  Filled 2015-09-01: qty 1

## 2015-09-01 MED ORDER — ONDANSETRON HCL 4 MG PO TABS
4.0000 mg | ORAL_TABLET | Freq: Four times a day (QID) | ORAL | Status: DC | PRN
Start: 2015-09-01 — End: 2015-09-05

## 2015-09-01 MED ORDER — ENOXAPARIN SODIUM 60 MG/0.6ML ~~LOC~~ SOLN
50.0000 mg | SUBCUTANEOUS | Status: DC
  Administered 2015-09-01 – 2015-09-04 (×4): 50 mg via SUBCUTANEOUS
  Filled 2015-09-01 (×5): qty 0.6

## 2015-09-01 MED ORDER — ATORVASTATIN CALCIUM 40 MG PO TABS
40.0000 mg | ORAL_TABLET | Freq: Every day | ORAL | Status: DC
  Administered 2015-09-02 – 2015-09-05 (×4): 40 mg via ORAL
  Filled 2015-09-01 (×4): qty 1

## 2015-09-01 MED ORDER — ONDANSETRON 4 MG PO TBDP
ORAL_TABLET | ORAL | Status: AC
  Filled 2015-09-01: qty 1

## 2015-09-01 NOTE — ED Provider Notes (Addendum)
[facility]PT Provider Note   CSN: [id number] Arrival date & time: 09/01/15  1637  First Provider Contact:  None       History   Chief Complaint Chief Complaint  Patient presents with  . Chills  . Shortness of Breath    HPI Victoria Jimenez is a 65 y.o. female.  Patient presents with fever and chills that started this afternoon. No sick contacts or recent travel. Patient does have urinary symptoms. Patient has mild soreness of breath is chronic. No skin infections known. Patient has history of reflux, high blood pressure, sleep apnea and coronary artery disease. Symptoms intermittent   The history is provided by the patient.  Shortness of Breath  Pertinent negatives include no fever, no headaches, no neck pain, no chest pain, no vomiting, no abdominal pain and no rash.    Past Medical History:  Diagnosis Date  . Allergic rhinitis   . Coronary artery disease    Treated by Angioplasty  . Diabetes mellitus without complication ([place])    Type II  . GERD (gastroesophageal reflux disease)   . Herpes simplex   . Hyperlipidemia   . Hypertension   . OSA (obstructive sleep apnea)    on CPAP  . Sleep apnea    wears cpap nightly    Patient Active Problem List   Diagnosis Date Noted  . Sepsis ([place]) 09/01/2015  . UTI (lower urinary tract infection) 09/01/2015  . GERD (gastroesophageal reflux disease) 09/01/2015  . Vaginal yeast infection 09/01/2015  . Piriformis syndrome of left side 09/01/2015  . Back pain 06/18/2014  . Normal coronary arteries 2007 05/08/2014  . Exertional dyspnea 05/08/2014  . Chest pain with moderate risk of acute coronary syndrome 05/08/2014  . PVD (peripheral vascular disease) ([place]) 05/08/2014  . Obstructive sleep apnea 01/25/2013  . Obesity (BMI 30-39.9) 01/25/2013  . Hypertension   . Insulin dependent diabetes mellitus ([place])     Past Surgical History:  Procedure Laterality Date  . ABDOMINAL HYSTERECTOMY    . ANGIOPLASTY    .  CARDIAC CATHETERIZATION    . TUBAL LIGATION      OB History    No data available       Home Medications    Prior to Admission medications   Medication Sig Start Date End Date Taking? Authorizing Provider  ACCU-CHEK AVIVA PLUS test strip As directed 07/04/15  Yes Historical Provider, MD  acetaminophen (TYLENOL) 325 MG tablet Take 650 mg by mouth every 6 (six) hours as needed.   Yes Historical Provider, MD  amLODipine-atorvastatin (CADUET) 10-40 MG per tablet Take 1 tablet by mouth daily.   Yes Historical Provider, MD  aspirin EC 81 MG tablet Take 81 mg by mouth daily.   Yes Historical Provider, MD  BD PEN NEEDLE NANO U/F 32G X 4 MM MISC See admin instructions. As directed 08/21/15  Yes Historical Provider, MD  insulin aspart protamine-insulin aspart (NOVOLOG 70/30) (70-30) 100 UNIT/ML injection Inject 20 Units into the skin 2 (two) times daily with a meal.    Yes Historical Provider, MD  INVOKAMET 150-500 MG TABS Take 1 tablet by mouth 2 (two) times daily. 03/26/14  Yes Historical Provider, MD  LEVEMIR FLEXTOUCH 100 UNIT/ML Pen Inject 5 Units into the skin at bedtime.  10/23/14  Yes Historical Provider, MD  meloxicam (MOBIC) 15 MG tablet Take 15 mg by mouth daily. 08/16/15  Yes Historical Provider, MD  pantoprazole (PROTONIX) 40 MG tablet Take 40 mg by mouth daily.   Yes  Historical Provider, MD  PATADAY 0.2 % SOLN Place 2 drops into both eyes 2 (two) times daily. 06/26/15  Yes Historical Provider, MD  phentermine (ADIPEX-P) 37.5 MG tablet Take 37.5 mg by mouth daily. 08/20/15  Yes Historical Provider, MD  POTASSIUM PO Take 1-2 tablets by mouth daily as needed (for cramping (legs & feet)).   Yes Historical Provider, MD  ULTICARE SHORT PEN NEEDLES 31G X 8 MM MISC As directed 07/04/15  Yes Historical Provider, MD  bisacodyl (DULCOLAX) 5 MG EC tablet Take 1 tablet (5 mg total) by mouth daily as needed for moderate constipation. 09/05/15   Maryann Mikhail, DO  clotrimazole (GYNE-LOTRIMIN) 1 % vaginal  cream Place 1 Applicatorful vaginally at bedtime. Use through 09/09/2015. 09/05/15   Cristal Ford, DO  HUMULIN N KWIKPEN 100 UNIT/ML Kiwkpen Inject 30 Units into the skin 2 (two) times daily. 08/29/15   Historical Provider, MD  levofloxacin (LEVAQUIN) 750 MG tablet Take 1 tablet (750 mg total) by mouth daily. Start 09/06/2015 09/06/15   Maryann Mikhail, DO  polyethylene glycol (MIRALAX / GLYCOLAX) packet Take 17 g by mouth daily as needed for mild constipation. 09/05/15   Cristal Ford, DO    Family History Family History  Problem Relation Age of Onset  . Coronary artery disease Father   . Lung cancer Mother   . Diabetes Mother   . Hypertension Sister   . Diabetes Brother   . Diabetes Maternal Grandmother   . Colon cancer Neg Hx     Social History Social History  Substance Use Topics  . Smoking status: Never Smoker  . Smokeless tobacco: Never Used  . Alcohol use 0.0 oz/week     Comment: rare     Allergies   Review of patient's allergies indicates no known allergies.   Review of Systems Review of Systems  Constitutional: Negative for chills and fever.  HENT: Negative for congestion.   Eyes: Negative for visual disturbance.  Respiratory: Positive for shortness of breath.   Cardiovascular: Negative for chest pain.  Gastrointestinal: Negative for abdominal pain and vomiting.  Genitourinary: Positive for dysuria. Negative for flank pain.  Musculoskeletal: Negative for back pain, neck pain and neck stiffness.  Skin: Negative for rash.  Neurological: Negative for light-headedness and headaches.     Physical Exam Updated Vital Signs BP 127/61   Pulse 80   Temp 97.5 F (36.4 C) (Oral)   Resp 20   Ht 5\' 5"  (1.651 m)   Wt 228 lb 3.2 oz (103.5 kg)   SpO2 99%   BMI 37.97 kg/m   Physical Exam  Constitutional: She is oriented to person, place, and time. She appears well-developed and well-nourished.  HENT:  Head: Normocephalic and atraumatic.  Dry mm  Eyes:  Conjunctivae are normal. Right eye exhibits no discharge. Left eye exhibits no discharge.  Neck: Normal range of motion. Neck supple. No tracheal deviation present.  Cardiovascular: Normal rate and regular rhythm.   Pulmonary/Chest: Effort normal and breath sounds normal.  Abdominal: Soft. She exhibits no distension. There is no tenderness. There is no guarding.  Musculoskeletal: She exhibits no edema.  Neurological: She is alert and oriented to person, place, and time.  Skin: Skin is warm. No rash noted.  Psychiatric: She has a normal mood and affect.  Nursing note and vitals reviewed.    ED Treatments / Results  Labs (all labs ordered are listed, but only abnormal results are displayed) Labs Reviewed  URINE CULTURE - Abnormal; Notable for the following:  Result Value   Culture >=100,000 COLONIES/mL ESCHERICHIA COLI (*)    Organism ID, Bacteria ESCHERICHIA COLI (*)    All other components within normal limits  CULTURE, BLOOD (ROUTINE X 2) - Abnormal; Notable for the following:    Culture ESCHERICHIA COLI (*)    All other components within normal limits  BLOOD CULTURE ID PANEL (REFLEXED) - Abnormal; Notable for the following:    Enterobacteriaceae species DETECTED (*)    Escherichia coli DETECTED (*)    All other components within normal limits  COMPREHENSIVE METABOLIC PANEL - Abnormal; Notable for the following:    Glucose, Bld 144 (*)    Creatinine, Ser 1.06 (*)    GFR calc non Af Amer 55 (*)    All other components within normal limits  CBC WITH DIFFERENTIAL/PLATELET - Abnormal; Notable for the following:    RBC 5.48 (*)    HCT 46.2 (*)    Monocytes Absolute 0.0 (*)    All other components within normal limits  URINALYSIS, ROUTINE W REFLEX MICROSCOPIC (NOT AT [facility]) - Abnormal; Notable for the following:    APPearance CLOUDY (*)    Glucose, UA >1000 (*)    Hgb urine dipstick LARGE (*)    Protein, ur 30 (*)    Nitrite POSITIVE (*)    Leukocytes, UA MODERATE (*)     All other components within normal limits  URINE MICROSCOPIC-ADD ON - Abnormal; Notable for the following:    Squamous Epithelial / LPF 0-5 (*)    Bacteria, UA MANY (*)    All other components within normal limits  LACTIC ACID, PLASMA - Abnormal; Notable for the following:    Lactic Acid, Venous 2.0 (*)    All other components within normal limits  CBC WITH DIFFERENTIAL/PLATELET - Abnormal; Notable for the following:    WBC 13.2 (*)    Neutro Abs 10.4 (*)    All other components within normal limits  GLUCOSE, CAPILLARY - Abnormal; Notable for the following:    Glucose-Capillary 158 (*)    All other components within normal limits  BASIC METABOLIC PANEL - Abnormal; Notable for the following:    Glucose, Bld 144 (*)    Calcium 8.7 (*)    All other components within normal limits  HEMOGLOBIN A1C - Abnormal; Notable for the following:    Hgb A1c MFr Bld 6.4 (*)    All other components within normal limits  GLUCOSE, CAPILLARY - Abnormal; Notable for the following:    Glucose-Capillary 179 (*)    All other components within normal limits  GLUCOSE, CAPILLARY - Abnormal; Notable for the following:    Glucose-Capillary 192 (*)    All other components within normal limits  GLUCOSE, CAPILLARY - Abnormal; Notable for the following:    Glucose-Capillary 114 (*)    All other components within normal limits  GLUCOSE, CAPILLARY - Abnormal; Notable for the following:    Glucose-Capillary 174 (*)    All other components within normal limits  GLUCOSE, CAPILLARY - Abnormal; Notable for the following:    Glucose-Capillary 115 (*)    All other components within normal limits  GLUCOSE, CAPILLARY - Abnormal; Notable for the following:    Glucose-Capillary 157 (*)    All other components within normal limits  GLUCOSE, CAPILLARY - Abnormal; Notable for the following:    Glucose-Capillary 127 (*)    All other components within normal limits  GLUCOSE, CAPILLARY - Abnormal; Notable for the following:      Glucose-Capillary 148 (*)  All other components within normal limits  GLUCOSE, CAPILLARY - Abnormal; Notable for the following:    Glucose-Capillary 145 (*)    All other components within normal limits  GLUCOSE, CAPILLARY - Abnormal; Notable for the following:    Glucose-Capillary 191 (*)    All other components within normal limits  GLUCOSE, CAPILLARY - Abnormal; Notable for the following:    Glucose-Capillary 132 (*)    All other components within normal limits  GLUCOSE, CAPILLARY - Abnormal; Notable for the following:    Glucose-Capillary 173 (*)    All other components within normal limits  CBG MONITORING, ED - Abnormal; Notable for the following:    Glucose-Capillary 152 (*)    All other components within normal limits  I-STAT CG4 LACTIC ACID, ED - Abnormal; Notable for the following:    Lactic Acid, Venous 4.66 (*)    All other components within normal limits  I-STAT CG4 LACTIC ACID, ED - Abnormal; Notable for the following:    Lactic Acid, Venous 2.55 (*)    All other components within normal limits  CULTURE, BLOOD (ROUTINE X 2)  CULTURE, BLOOD (ROUTINE X 2)  CULTURE, BLOOD (ROUTINE X 2)  PROCALCITONIN  APTT  PROTIME-INR  GLUCOSE, CAPILLARY  GLUCOSE, CAPILLARY  CBC  I-STAT BETA HCG BLOOD, ED ([facility], [facility], [facility] ONLY)    EKG  EKG Interpretation None       Radiology No results found.  Procedures Procedures (including critical care time) CRITICAL CARE Performed by: Mariea Clonts   Total critical care time: 35 minutes  Critical care time was exclusive of separately billable procedures and treating other patients.  Critical care was necessary to treat or prevent imminent or life-threatening deterioration.  Critical care was time spent personally by me on the following activities: development of treatment plan with patient and/or surrogate as well as nursing, discussions with consultants, evaluation of patient's response to treatment, examination of patient,  obtaining history from patient or surrogate, ordering and performing treatments and interventions, ordering and review of laboratory studies, ordering and review of radiographic studies, pulse oximetry and re-evaluation of patient's condition.  Medications Ordered in ED Medications  0.9 %  sodium chloride infusion ( Intravenous Stopped 09/02/15 1043)  ondansetron (ZOFRAN-ODT) disintegrating tablet 4 mg (4 mg Oral Given 09/01/15 1700)  sodium chloride 0.9 % bolus 1,000 mL (0 mLs Intravenous Stopped 09/01/15 1856)    And  sodium chloride 0.9 % bolus 1,000 mL (0 mLs Intravenous Stopped 09/01/15 1955)    And  sodium chloride 0.9 % bolus 1,000 mL (0 mLs Intravenous Stopped 09/01/15 1956)    And  sodium chloride 0.9 % bolus 500 mL (0 mLs Intravenous Stopped 09/01/15 1842)  cefTRIAXone (ROCEPHIN) 2 g in dextrose 5 % 50 mL IVPB (0 g Intravenous Stopped 09/01/15 1856)     Initial Impression / Assessment and Plan / ED Course  I have reviewed the triage vital signs and the nursing notes.  Pertinent labs & imaging results that were available during my care of the patient were reviewed by me and considered in my medical decision making (see chart for details).  Clinical Course   Patient presents with clinical concern for sepsis. Sepsis order set use, IV fluids appropriate for weight used. Blood cultures and antibiotics ordered. Blood cultures re-ordered as they were canceled by a provider for unknown reason.   Patient's blood pressure stable in the ER. Plan for admission and repeat lactate.  The patients results and plan were reviewed and discussed.  Any x-rays performed were independently reviewed by myself.   Differential diagnosis were considered with the presenting HPI.  Medications  0.9 %  sodium chloride infusion ( Intravenous Stopped 09/02/15 1043)  ondansetron (ZOFRAN-ODT) disintegrating tablet 4 mg (4 mg Oral Given 09/01/15 1700)  sodium chloride 0.9 % bolus 1,000 mL (0 mLs Intravenous  Stopped 09/01/15 1856)    And  sodium chloride 0.9 % bolus 1,000 mL (0 mLs Intravenous Stopped 09/01/15 1955)    And  sodium chloride 0.9 % bolus 1,000 mL (0 mLs Intravenous Stopped 09/01/15 1956)    And  sodium chloride 0.9 % bolus 500 mL (0 mLs Intravenous Stopped 09/01/15 1842)  cefTRIAXone (ROCEPHIN) 2 g in dextrose 5 % 50 mL IVPB (0 g Intravenous Stopped 09/01/15 1856)    Vitals:   09/04/15 2100 09/04/15 2315 09/05/15 0514 09/05/15 0947  BP: (!) 136/56  (!) 118/59 127/61  Pulse: 82 74 80   Resp: 20 18 20    Temp: 98.6 F (37 C)  97.5 F (36.4 C)   TempSrc: Oral  Oral   SpO2: 98% 96% 99%   Weight:   228 lb 3.2 oz (103.5 kg)   Height:        Final diagnoses:  Sepsis, unspecified organism ([place])  Sepsis due to urinary tract infection ([place])    Admission/ observation were discussed with the admitting physician, patient and/or family and they are comfortable with the plan.    Final Clinical Impressions(s) / ED Diagnoses   Final diagnoses:  Sepsis, unspecified organism ([place])  Sepsis due to urinary tract infection [facility])    New Prescriptions Discharge Medication List as of 09/05/2015  1:16 PM    START taking these medications   Details  bisacodyl (DULCOLAX) 5 MG EC tablet Take 1 tablet (5 mg total) by mouth daily as needed for moderate constipation., Starting Thu 09/05/2015, Normal    clotrimazole (GYNE-LOTRIMIN) 1 % vaginal cream Place 1 Applicatorful vaginally at bedtime. Use through 09/09/2015., Starting Thu 09/05/2015, Normal    levofloxacin (LEVAQUIN) 750 MG tablet Take 1 tablet (750 mg total) by mouth daily. Start 09/06/2015, Starting Fri 09/06/2015, Normal    polyethylene glycol (MIRALAX / GLYCOLAX) packet Take 17 g by mouth daily as needed for mild constipation., Starting Thu 09/05/2015, Normal         Elnora Morrison, MD 09/01/15 2008    Elnora Morrison, MD 09/20/15 [phone]

## 2015-09-01 NOTE — Progress Notes (Signed)
Placed patient on CPAP for the night without complications. RT will continue to monitor as needed.

## 2015-09-01 NOTE — ED Notes (Signed)
Phlebotomy at the bedside getting labs

## 2015-09-01 NOTE — ED Triage Notes (Signed)
Per Pt, Pt report being in [place] around 1.5 hours ago when she started to get chilled. Pt denies Hx of cough or congestion. Complains of SOB. Reports having frequent urination and slight discomfort.

## 2015-09-01 NOTE — H&P (Signed)
History and Physical    Victoria Jimenez [id number] DOB: Dec 28, 1950 DOA: 09/01/2015  PCP: Elizabeth Palau, MD (Inactive)   Patient coming from: Home   Chief Complaint: Fever, chills, nausea, dysuria   HPI: Victoria Jimenez is a 65 y.o. female with medical history significant for hypertension, insulin-dependent diabetes mellitus, GERD, and OSA on CPAP who presents the emergency department with fevers, chills, dysuria, and nausea. Patient reports that she had been in her usual state of health until insidious development of dysuria and vulvovaginal pruritus over the past couple days. Today, she experienced fevers with chills, reports that her "teeth were chattering." She reports recurrent vulvovaginal candidiasis and describes the current pruritus as consistent with her prior experiences with yeast infection. She notes that the dysuria she is experiencing is not typically present with the yeast infections. She has not been on any recent antibiotics and denies antibiotic allergy. She denies frequent UTI, instrumentation, catheter, history of stones, or flank pain. She denies chest pain or palpitations. She endorses dyspnea over the past day, but denies cough. There has been no lower extremity edema or orthopnea. She denies headaches, vision or hearing change, loss of coordination, or focal numbness or weakness. Patient also reports severe pain starting in the left buttock and radiating down the back of the left leg. She reports that this is been going on for months, is intermittent, severe, exacerbated by certain activities, and improved by adopting certain positions. She denies lower extremity numbness or weakness. She denies pain in the low back.  ED Course: Upon arrival to the ED, patient is found to be febrile to 38.2 C, saturating well on room air, tachycardic in the 110s, and with stable blood pressure. Chest x-ray was negative for acute cardiopulmonary disease and chemistry panel was  notable for a mild elevation in serum creatinine and serum glucose. CBC is unremarkable and lactic acid returned elevated to a value of 4.66. Urinalysis features many bacteria, moderate leukocytes, positive nitrites, and too numerous to count WBC. Patient was given a 30 cc/kg normal saline bolus in the emergency department. Blood and urine cultures were obtained and she was started on empiric Rocephin. Repeat lactic acid after the fluid bolus returned elevated, but much improved to 2.55. Patient remained hemodynamically stable in the emergency department and will be observed on the telemetry unit for ongoing evaluation and management of sepsis suspected secondary to UTI.  Review of Systems:  All other systems reviewed and apart from HPI, are negative.  Past Medical History:  Diagnosis Date  . Allergic rhinitis   . Coronary artery disease    Treated by Angioplasty  . Diabetes mellitus without complication ([place])    Type II  . GERD (gastroesophageal reflux disease)   . Herpes simplex   . Hyperlipidemia   . Hypertension   . OSA (obstructive sleep apnea)    on CPAP  . Sleep apnea    wears cpap nightly    Past Surgical History:  Procedure Laterality Date  . ABDOMINAL HYSTERECTOMY    . ANGIOPLASTY    . CARDIAC CATHETERIZATION    . TUBAL LIGATION       reports that she has never smoked. She has never used smokeless tobacco. She reports that she drinks alcohol. She reports that she does not use drugs.  No Known Allergies  Family History  Problem Relation Age of Onset  . Coronary artery disease Father   . Lung cancer Mother   . Diabetes Mother   . Hypertension Sister   .  Diabetes Brother   . Diabetes Maternal Grandmother   . Colon cancer Neg Hx      Prior to Admission medications   Medication Sig Start Date End Date Taking? Authorizing Provider  ACCU-CHEK AVIVA PLUS test strip As directed 07/04/15  Yes Historical Provider, MD  acetaminophen (TYLENOL) 325 MG tablet Take 650 mg by  mouth every 6 (six) hours as needed.   Yes Historical Provider, MD  amLODipine-atorvastatin (CADUET) 10-40 MG per tablet Take 1 tablet by mouth daily.   Yes Historical Provider, MD  aspirin EC 81 MG tablet Take 81 mg by mouth daily.   Yes Historical Provider, MD  BD PEN NEEDLE NANO U/F 32G X 4 MM MISC See admin instructions. As directed 08/21/15  Yes Historical Provider, MD  hydrochlorothiazide (HYDRODIURIL) 25 MG tablet Take 25 mg by mouth daily.   Yes Historical Provider, MD  insulin aspart protamine-insulin aspart (NOVOLOG 70/30) (70-30) 100 UNIT/ML injection Inject 20 Units into the skin 2 (two) times daily with a meal.    Yes Historical Provider, MD  INVOKAMET 150-500 MG TABS Take 1 tablet by mouth 2 (two) times daily. 03/26/14  Yes Historical Provider, MD  LEVEMIR FLEXTOUCH 100 UNIT/ML Pen Inject 5 Units into the skin at bedtime.  10/23/14  Yes Historical Provider, MD  losartan (COZAAR) 100 MG tablet Take 100 mg by mouth daily. 12/29/14  Yes Historical Provider, MD  meloxicam (MOBIC) 15 MG tablet Take 15 mg by mouth daily. 08/16/15  Yes Historical Provider, MD  metoprolol succinate (TOPROL XL) 25 MG 24 hr tablet Take 1 tablet (25 mg total) by mouth daily. 06/26/15  Yes Belva Crome, MD  pantoprazole (PROTONIX) 40 MG tablet Take 40 mg by mouth daily.   Yes Historical Provider, MD  PATADAY 0.2 % SOLN Place 2 drops into both eyes 2 (two) times daily. 06/26/15  Yes Historical Provider, MD  phentermine (ADIPEX-P) 37.5 MG tablet Take 37.5 mg by mouth daily. 08/20/15  Yes Historical Provider, MD  POTASSIUM PO Take 1-2 tablets by mouth daily as needed (for cramping (legs & feet)).   Yes Historical Provider, MD  Flossie Buffy SHORT PEN NEEDLES 31G X 8 MM MISC As directed 07/04/15  Yes Historical Provider, MD  HUMULIN N KWIKPEN 100 UNIT/ML Kiwkpen Inject 30 Units into the skin 2 (two) times daily. 08/29/15   Historical Provider, MD    Physical Exam: Vitals:   09/01/15 1643 09/01/15 1807 09/01/15 1915 09/01/15  1930  BP: 104/93 130/60 117/77 127/75  Pulse: 107 111 91 87  Resp: (!) 30 18 18 21   Temp: 100.8 F (38.2 C) 99.7 F (37.6 C)    TempSrc: Oral Oral    SpO2: 99% 98% 100% 99%  Weight: 104.3 kg (230 lb)     Height: 5\' 6"  (1.676 m)         Constitutional: NAD, calm, comfortable Eyes: PERTLA, lids and conjunctivae normal ENMT: Mucous membranes are moist. Posterior pharynx clear of any exudate or lesions.   Neck: normal, supple, no masses, no thyromegaly Respiratory: clear to auscultation bilaterally, no wheezing, no crackles. Normal respiratory effort.  Cardiovascular: S1 & S2 heard, regular rate and rhythm. No extremity edema. 2+ pedal pulses. No significant JVD. Abdomen: No distension, mild suprapubic tenderness, no masses palpated. Bowel sounds normal.  Musculoskeletal: no clubbing / cyanosis. No joint deformity upper and lower extremities. Point tenderness at left piriformis.  Skin: no significant rashes, lesions, ulcers. Warm, dry, well-perfused. Neurologic: CN 2-12 grossly intact. Sensation intact, DTR normal. Strength  5/5 in all 4 limbs.  Psychiatric: Normal judgment and insight. Alert and oriented x 3. Normal mood and affect.     Labs on Admission: I have personally reviewed following labs and imaging studies  CBC:  Recent Labs Lab 09/01/15 1703  WBC 6.1  NEUTROABS 5.2  HGB 14.7  HCT 46.2*  MCV 84.3  PLT 0000000   Basic Metabolic Panel:  Recent Labs Lab 09/01/15 1703  NA 137  K 3.9  CL 101  CO2 24  GLUCOSE 144*  BUN 17  CREATININE 1.06*  CALCIUM 9.4   GFR: Estimated Creatinine Clearance: 67.2 mL/min (by C-G formula based on SCr of 1.06 mg/dL). Liver Function Tests:  Recent Labs Lab 09/01/15 1703  AST 27  ALT 21  ALKPHOS 89  BILITOT 0.9  PROT 8.0  ALBUMIN 4.2   No results for input(s): LIPASE, AMYLASE in the last 168 hours. No results for input(s): AMMONIA in the last 168 hours. Coagulation Profile: No results for input(s): INR, PROTIME in the  last 168 hours. Cardiac Enzymes: No results for input(s): CKTOTAL, CKMB, CKMBINDEX, TROPONINI in the last 168 hours. BNP (last 3 results) No results for input(s): PROBNP in the last 8760 hours. HbA1C: No results for input(s): HGBA1C in the last 72 hours. CBG:  Recent Labs Lab 09/01/15 1642  GLUCAP 152*   Lipid Profile: No results for input(s): CHOL, HDL, LDLCALC, TRIG, CHOLHDL, LDLDIRECT in the last 72 hours. Thyroid Function Tests: No results for input(s): TSH, T4TOTAL, FREET4, T3FREE, THYROIDAB in the last 72 hours. Anemia Panel: No results for input(s): VITAMINB12, FOLATE, FERRITIN, TIBC, IRON, RETICCTPCT in the last 72 hours. Urine analysis:    Component Value Date/Time   COLORURINE YELLOW 09/01/2015 1802   APPEARANCEUR CLOUDY (A) 09/01/2015 1802   LABSPEC 1.018 09/01/2015 1802   PHURINE 6.0 09/01/2015 1802   GLUCOSEU >1000 (A) 09/01/2015 1802   HGBUR LARGE (A) 09/01/2015 1802   BILIRUBINUR NEGATIVE 09/01/2015 1802   KETONESUR NEGATIVE 09/01/2015 1802   PROTEINUR 30 (A) 09/01/2015 1802   NITRITE POSITIVE (A) 09/01/2015 1802   LEUKOCYTESUR MODERATE (A) 09/01/2015 1802   Sepsis Labs: @LABRCNTIP (procalcitonin:4,lacticidven:4) )No results found for this or any previous visit (from the past 240 hour(s)).   Radiological Exams on Admission: Dg Chest Port 1 View  Result Date: 09/01/2015 CLINICAL DATA:  65 year old female with left-sided chest pain for 1 week with cough and vomiting 1 day. EXAM: PORTABLE CHEST 1 VIEW COMPARISON:  01/11/2012 and prior chest radiographs FINDINGS: The cardiomediastinal silhouette is unremarkable. Mild peribronchial thickening is unchanged. There is no evidence of focal airspace disease, pulmonary edema, suspicious pulmonary nodule/mass, pleural effusion, or pneumothorax. No acute bony abnormalities are identified. IMPRESSION: No active disease. Electronically Signed   By: Margarette Canada M.D.   On: 09/01/2015 18:21   EKG: Not performed, will obtain  as appropriate   Assessment/Plan  1. Sepsis, suspected secondary to UTI  - Presents febrile, tachycardic, and with UTI and elevated lactate - 30 cc/kg bolus of NS given in ED; continue with NS at 100 cc/hr for now  - Blood and urine cultures collected and incubating  - Empiric treatment with Rocephin initiated, will continue while awaiting culture data  - Lactic acid 4.66 -> 2.55, continue to trend  - No prior positive urine or blood cultures in EMR   2. Insulin-dependent DM  - No A1c on file  - Managed at home with Invokamet, Levemir 5 units qHS, and Novolog 70/30 20 units BID  - Check CBG  with meals and qHS  - Hold the oral agent and 70/30 insulin while in hospital  - Continue Levemir 5 units qHS, start 4 units Humalog TID with meals, and start moderate-intensity SSI correctional   3. HTN  - At goal currently  - Managed with losartan, HCTZ, Toprol, and Norvasc at home  - Continue Norvasc only for now in setting of sepsis with concern for worsening  - Resume home medications as appropriate    4. OSA - Stable, continue CPAP qHS    5. GERD - Stable, managed at home with Protonix, will continue    6. Vaginal yeast infection  - Pt reports frequent recurrence  - May need to reconsider Invokamet  - Treat with topical clotrimazole for 7 days   7. Piriformis syndrome, left  - Pain for several mos now, no weakness or numbness; no LBP  - Discussed some stretches she could try to relieve pressure on sciatic nerve; might benefit from outpatient PT   DVT prophylaxis: sq Lovenox  Code Status: Full  Family Communication: Family updated at bedside  Disposition Plan: Observe on telemetry  Consults called: None  Admission status: Observation     Vianne Bulls, MD [facility] Pager [phone]  If 7PM-7AM, please contact night-coverage [email or web address] Password [email or web address]  09/01/2015, 9:20 PM

## 2015-09-01 NOTE — ED Notes (Signed)
Lactic acid elevation given to nguyen.  Looking for a bed for the pt

## 2015-09-01 NOTE — Progress Notes (Addendum)
Pharmacy Antibiotic Note  Victoria Jimenez is a 65 y.o. female admitted on 09/01/2015 with sepsis with likely UTI source.  Pharmacy has been consulted for ceftriaxone dosing.  Plan: ceftriaxone 2g IV q24h d/t weight >100kg  Pharmacy to sign off as ceftriaxone dose not require renal adjustment  Height: 5\' 6"  (167.6 cm) Weight: 230 lb (104.3 kg) IBW/kg (Calculated) : 59.3  Temp (24hrs), Avg:100.3 F (37.9 C), Min:99.7 F (37.6 C), Max:100.8 F (38.2 C)   Recent Labs Lab 09/01/15 1703 09/01/15 1730  WBC 6.1  --   CREATININE 1.06*  --   LATICACIDVEN  --  4.66*    Estimated Creatinine Clearance: 67.2 mL/min (by C-G formula based on SCr of 1.06 mg/dL).    No Known Allergies  Antimicrobials this admission: 7/23 CTX>>  Dose adjustments this admission: n/a  Microbiology results: 7/23 BCx: sent 7/23 UCx: sent    Thank you for allowing pharmacy to be a part of this patient's care.  Michaeline Eckersley D. Endya Austin, PharmD, BCPS Clinical Pharmacist Pager: [phone] 09/01/2015 6:25 PM

## 2015-09-02 DIAGNOSIS — E669 Obesity, unspecified: Secondary | ICD-10-CM | POA: Diagnosis present

## 2015-09-02 DIAGNOSIS — Z794 Long term (current) use of insulin: Secondary | ICD-10-CM | POA: Diagnosis not present

## 2015-09-02 DIAGNOSIS — B373 Candidiasis of vulva and vagina: Secondary | ICD-10-CM | POA: Diagnosis present

## 2015-09-02 DIAGNOSIS — R0602 Shortness of breath: Secondary | ICD-10-CM | POA: Diagnosis present

## 2015-09-02 DIAGNOSIS — A4151 Sepsis due to Escherichia coli [E. coli]: Secondary | ICD-10-CM | POA: Diagnosis not present

## 2015-09-02 DIAGNOSIS — E785 Hyperlipidemia, unspecified: Secondary | ICD-10-CM | POA: Diagnosis present

## 2015-09-02 DIAGNOSIS — Z955 Presence of coronary angioplasty implant and graft: Secondary | ICD-10-CM | POA: Diagnosis not present

## 2015-09-02 DIAGNOSIS — Z6837 Body mass index (BMI) 37.0-37.9, adult: Secondary | ICD-10-CM | POA: Diagnosis not present

## 2015-09-02 DIAGNOSIS — Z7982 Long term (current) use of aspirin: Secondary | ICD-10-CM | POA: Diagnosis not present

## 2015-09-02 DIAGNOSIS — J309 Allergic rhinitis, unspecified: Secondary | ICD-10-CM | POA: Diagnosis present

## 2015-09-02 DIAGNOSIS — G4733 Obstructive sleep apnea (adult) (pediatric): Secondary | ICD-10-CM | POA: Diagnosis present

## 2015-09-02 DIAGNOSIS — R7881 Bacteremia: Secondary | ICD-10-CM | POA: Diagnosis not present

## 2015-09-02 DIAGNOSIS — E119 Type 2 diabetes mellitus without complications: Secondary | ICD-10-CM | POA: Diagnosis not present

## 2015-09-02 DIAGNOSIS — G5702 Lesion of sciatic nerve, left lower limb: Secondary | ICD-10-CM | POA: Diagnosis present

## 2015-09-02 DIAGNOSIS — I10 Essential (primary) hypertension: Secondary | ICD-10-CM | POA: Diagnosis present

## 2015-09-02 DIAGNOSIS — E1140 Type 2 diabetes mellitus with diabetic neuropathy, unspecified: Secondary | ICD-10-CM | POA: Diagnosis present

## 2015-09-02 DIAGNOSIS — Z8249 Family history of ischemic heart disease and other diseases of the circulatory system: Secondary | ICD-10-CM | POA: Diagnosis not present

## 2015-09-02 DIAGNOSIS — N390 Urinary tract infection, site not specified: Secondary | ICD-10-CM | POA: Diagnosis not present

## 2015-09-02 DIAGNOSIS — K219 Gastro-esophageal reflux disease without esophagitis: Secondary | ICD-10-CM | POA: Diagnosis present

## 2015-09-02 DIAGNOSIS — Z79899 Other long term (current) drug therapy: Secondary | ICD-10-CM | POA: Diagnosis not present

## 2015-09-02 DIAGNOSIS — I2510 Atherosclerotic heart disease of native coronary artery without angina pectoris: Secondary | ICD-10-CM | POA: Diagnosis present

## 2015-09-02 DIAGNOSIS — Z90710 Acquired absence of both cervix and uterus: Secondary | ICD-10-CM | POA: Diagnosis not present

## 2015-09-02 LAB — BLOOD CULTURE ID PANEL (REFLEXED)
Acinetobacter baumannii: NOT DETECTED
CANDIDA GLABRATA: NOT DETECTED
CANDIDA TROPICALIS: NOT DETECTED
CARBAPENEM RESISTANCE: NOT DETECTED
Candida albicans: NOT DETECTED
Candida krusei: NOT DETECTED
Candida parapsilosis: NOT DETECTED
ENTEROCOCCUS SPECIES: NOT DETECTED
ESCHERICHIA COLI: DETECTED — AB
Enterobacter cloacae complex: NOT DETECTED
Enterobacteriaceae species: DETECTED — AB
HAEMOPHILUS INFLUENZAE: NOT DETECTED
Klebsiella oxytoca: NOT DETECTED
Klebsiella pneumoniae: NOT DETECTED
LISTERIA MONOCYTOGENES: NOT DETECTED
METHICILLIN RESISTANCE: NOT DETECTED
Neisseria meningitidis: NOT DETECTED
PROTEUS SPECIES: NOT DETECTED
Pseudomonas aeruginosa: NOT DETECTED
SERRATIA MARCESCENS: NOT DETECTED
STAPHYLOCOCCUS SPECIES: NOT DETECTED
STREPTOCOCCUS PYOGENES: NOT DETECTED
Staphylococcus aureus (BCID): NOT DETECTED
Streptococcus agalactiae: NOT DETECTED
Streptococcus pneumoniae: NOT DETECTED
Streptococcus species: NOT DETECTED
Vancomycin resistance: NOT DETECTED

## 2015-09-02 LAB — CBC WITH DIFFERENTIAL/PLATELET
BASOS ABS: 0 10*3/uL (ref 0.0–0.1)
BASOS PCT: 0 %
EOS ABS: 0.1 10*3/uL (ref 0.0–0.7)
Eosinophils Relative: 1 %
HCT: 37.8 % (ref 36.0–46.0)
Hemoglobin: 12.1 g/dL (ref 12.0–15.0)
Lymphocytes Relative: 16 %
Lymphs Abs: 2.2 10*3/uL (ref 0.7–4.0)
MCH: 26.8 pg (ref 26.0–34.0)
MCHC: 32 g/dL (ref 30.0–36.0)
MCV: 83.8 fL (ref 78.0–100.0)
MONO ABS: 0.6 10*3/uL (ref 0.1–1.0)
MONOS PCT: 4 %
NEUTROS PCT: 79 %
Neutro Abs: 10.4 10*3/uL — ABNORMAL HIGH (ref 1.7–7.7)
Platelets: 241 10*3/uL (ref 150–400)
RBC: 4.51 MIL/uL (ref 3.87–5.11)
RDW: 14.1 % (ref 11.5–15.5)
WBC: 13.2 10*3/uL — ABNORMAL HIGH (ref 4.0–10.5)

## 2015-09-02 LAB — GLUCOSE, CAPILLARY
GLUCOSE-CAPILLARY: 94 mg/dL (ref 65–99)
GLUCOSE-CAPILLARY: 158 mg/dL — AB (ref 65–99)
GLUCOSE-CAPILLARY: 179 mg/dL — AB (ref 65–99)
Glucose-Capillary: 192 mg/dL — ABNORMAL HIGH (ref 65–99)

## 2015-09-02 MED ORDER — SODIUM CHLORIDE 0.9 % IV SOLN
INTRAVENOUS | Status: DC
  Administered 2015-09-02 – 2015-09-04 (×3): via INTRAVENOUS

## 2015-09-02 NOTE — Progress Notes (Signed)
PHARMACY - PHYSICIAN COMMUNICATION CRITICAL VALUE ALERT - BLOOD CULTURE IDENTIFICATION (BCID)  Results for orders placed or performed during the hospital encounter of 09/01/15  Blood Culture ID Panel (Reflexed) (Collected: 09/01/2015  5:03 PM)  Result Value Ref Range   Enterococcus species NOT DETECTED NOT DETECTED   Vancomycin resistance NOT DETECTED NOT DETECTED   Listeria monocytogenes NOT DETECTED NOT DETECTED   Staphylococcus species NOT DETECTED NOT DETECTED   Staphylococcus aureus NOT DETECTED NOT DETECTED   Methicillin resistance NOT DETECTED NOT DETECTED   Streptococcus species NOT DETECTED NOT DETECTED   Streptococcus agalactiae NOT DETECTED NOT DETECTED   Streptococcus pneumoniae NOT DETECTED NOT DETECTED   Streptococcus pyogenes NOT DETECTED NOT DETECTED   Acinetobacter baumannii NOT DETECTED NOT DETECTED   Enterobacteriaceae species DETECTED (A) NOT DETECTED   Enterobacter cloacae complex NOT DETECTED NOT DETECTED   Escherichia coli DETECTED (A) NOT DETECTED   Klebsiella oxytoca NOT DETECTED NOT DETECTED   Klebsiella pneumoniae NOT DETECTED NOT DETECTED   Proteus species NOT DETECTED NOT DETECTED   Serratia marcescens NOT DETECTED NOT DETECTED   Carbapenem resistance NOT DETECTED NOT DETECTED   Haemophilus influenzae NOT DETECTED NOT DETECTED   Neisseria meningitidis NOT DETECTED NOT DETECTED   Pseudomonas aeruginosa NOT DETECTED NOT DETECTED   Candida albicans NOT DETECTED NOT DETECTED   Candida glabrata NOT DETECTED NOT DETECTED   Candida krusei NOT DETECTED NOT DETECTED   Candida parapsilosis NOT DETECTED NOT DETECTED   Candida tropicalis NOT DETECTED NOT DETECTED   65 year old female admitted with urosepsis.  She was started on Ceftriaxone empirically.  She now has 1/2 Blood cultures with Gram negative rods, E coli identified on BCID.  The BCID algorithm recommends Ceftriaxone for E coli bacteremia so no antibiotic changes are needed.  Name of physician (or  Provider) Contacted: Dr. [facility]  Changes to prescribed antibiotics required: Continue Ceftriaxone 2g IV q24h  Norva Riffle 09/02/2015  9:54 AM

## 2015-09-02 NOTE — Progress Notes (Addendum)
PROGRESS NOTE                                                                                                                                                                                                             Patient Demographics:    Victoria Jimenez, is a 65 y.o. female, DOB - Mar 24, 1950, [id number]  Admit date - 09/01/2015   Admitting Physician Vianne Bulls, MD  Outpatient Primary MD for the patient is Elizabeth Palau, MD (Inactive)  LOS - 0  Chief Complaint  Patient presents with  . Chills  . Shortness of Breath       Brief Narrative    65 y.o. female with medical history significant for hypertension, insulin-dependent diabetes mellitus, GERD, and OSA on CPAP , Admitted with sepsis secondary to UTI/bacteremia.   Subjective:    Victoria Jimenez today has, No headache, No chest pain, No abdominal pain , Reports she is feeling much better today.   Assessment  & Plan :    Principal Problem:   Sepsis ([place]) Active Problems:   Hypertension   Insulin dependent diabetes mellitus ([place])   Obstructive sleep apnea   UTI (lower urinary tract infection)   GERD (gastroesophageal reflux disease)   Vaginal yeast infection   Piriformis syndrome of left side  Sepsis secondary to UTI and Escherichia coli bacteremia. - Presents febrile, tachycardic, and elevated lactate - Positive urinalysis, culture pending - Urine culture ID reflects showing Escherichia coli - Continue with IV Rocephin followed final urine blood cultures - Lactic acid trending down, picked also given significantly elevated at 40  Insulin-dependent DM  - Managed at home with Invokamet, Levemir 5 units qHS, and Novolog 70/30 20 units BID ,  - Check CBG with meals and qHS , continue with Levemir, insulin sliding-scale, and 4 units before meals   HTN  - Managed with losartan, HCTZ, Toprol, and Norvasc at home , currently on Norvasc only secondary to labile  blood pressure.  OSA - Stable, continue CPAP qHS    GERD - Stable, managed at home with Protonix, will continue    Vaginal yeast infection  - Pt reports frequent recurrence  - May need to reconsider Invokamet  - Treat with topical clotrimazole for 7 days   Piriformis syndrome, left  - Pain for several mos now, no weakness or numbness; no  LBP  - Discussed some stretches she could try to relieve pressure on sciatic nerve; might benefit from outpatient PT    Code Status : Full  Family Communication  : None at bedside  Disposition Plan  : Home once stable  Consults  :  None  Procedures  : none  DVT Prophylaxis  :  Lovenox   Lab Results  Component Value Date   PLT 241 09/02/2015    Antibiotics  :    Anti-infectives    Start     Dose/Rate Route Frequency Ordered Stop   09/02/15 1800  cefTRIAXone (ROCEPHIN) 2 g in dextrose 5 % 50 mL IVPB     2 g 100 mL/hr over 30 Minutes Intravenous Every 24 hours 09/01/15 1827     09/01/15 1815  cefTRIAXone (ROCEPHIN) 2 g in dextrose 5 % 50 mL IVPB     2 g 100 mL/hr over 30 Minutes Intravenous  Once 09/01/15 1807 09/01/15 1856        Objective:   Vitals:   09/01/15 2000 09/01/15 2015 09/01/15 2213 09/02/15 0614  BP: (!) 111/49 (!) 118/51 135/89 (!) 103/49  Pulse: 85 82 89 67  Resp: (!) 27 20 18 15   Temp:    98.3 F (36.8 C)  TempSrc:    Oral  SpO2: 100% 99% 100% 99%  Weight:   104.5 kg (230 lb 6.4 oz) 104.4 kg (230 lb 1.6 oz)  Height:   5\' 5"  (1.651 m)     Wt Readings from Last 3 Encounters:  09/02/15 104.4 kg (230 lb 1.6 oz)  01/01/15 113.4 kg (250 lb)  12/20/14 113.8 kg (250 lb 12.8 oz)     Intake/Output Summary (Last 24 hours) at 09/02/15 1228 Last data filed at 09/02/15 1021  Gross per 24 hour  Intake              720 ml  Output             1700 ml  Net             -980 ml     Physical Exam  Awake Alert, Oriented X 3 Supple Neck,No JVD,   Symmetrical Chest wall movement, Good air movement  bilaterally, CTAB RRR,No Gallops,Rubs or new Murmurs, No Parasternal Heave +ve B.Sounds, Abd Soft, No tenderness No Cyanosis, Clubbing or edema, No new Rash or bruise      Data Review:    CBC  Recent Labs Lab 09/01/15 1703 09/02/15 0541  WBC 6.1 13.2*  HGB 14.7 12.1  HCT 46.2* 37.8  PLT 258 241  MCV 84.3 83.8  MCH 26.8 26.8  MCHC 31.8 32.0  RDW 13.8 14.1  LYMPHSABS 0.7 2.2  MONOABS 0.0* 0.6  EOSABS 0.1 0.1  BASOSABS 0.0 0.0    Chemistries   Recent Labs Lab 09/01/15 1703  NA 137  K 3.9  CL 101  CO2 24  GLUCOSE 144*  BUN 17  CREATININE 1.06*  CALCIUM 9.4  AST 27  ALT 21  ALKPHOS 89  BILITOT 0.9   ------------------------------------------------------------------------------------------------------------------ No results for input(s): CHOL, HDL, LDLCALC, TRIG, CHOLHDL, LDLDIRECT in the last 72 hours.  No results found for: HGBA1C ------------------------------------------------------------------------------------------------------------------ No results for input(s): TSH, T4TOTAL, T3FREE, THYROIDAB in the last 72 hours.  Invalid input(s): FREET3 ------------------------------------------------------------------------------------------------------------------ No results for input(s): VITAMINB12, FOLATE, FERRITIN, TIBC, IRON, RETICCTPCT in the last 72 hours.  Coagulation profile  Recent Labs Lab 09/01/15 2259  INR 1.07    No results for input(s):  DDIMER in the last 72 hours.  Cardiac Enzymes No results for input(s): CKMB, TROPONINI, MYOGLOBIN in the last 168 hours.  Invalid input(s): CK ------------------------------------------------------------------------------------------------------------------ No results found for: BNP  Inpatient Medications  Scheduled Meds: . amLODipine  10 mg Oral Daily   And  . atorvastatin  40 mg Oral Daily  . aspirin EC  81 mg Oral Daily  . cefTRIAXone (ROCEPHIN)  IV  2 g Intravenous Q24H  . clotrimazole  1  Applicatorful Vaginal QHS  . enoxaparin (LOVENOX) injection  50 mg Subcutaneous Q24H  . insulin aspart  0-15 Units Subcutaneous TID WC  . insulin aspart  4 Units Subcutaneous TID WC  . insulin detemir  5 Units Subcutaneous QHS  . meloxicam  15 mg Oral Daily  . pantoprazole  40 mg Oral Daily  . sodium chloride flush  3 mL Intravenous Q12H   Continuous Infusions:  PRN Meds:.acetaminophen **OR** acetaminophen, bisacodyl, HYDROcodone-acetaminophen, ondansetron **OR** ondansetron (ZOFRAN) IV, polyethylene glycol  Micro Results Recent Results (from the past 240 hour(s))  Culture, blood (routine x 2)     Status: None (Preliminary result)   Collection Time: 09/01/15  5:03 PM  Result Value Ref Range Status   Specimen Description RIGHT ANTECUBITAL  Final   Special Requests BOTTLES DRAWN AEROBIC AND ANAEROBIC 10CC  Final   Culture  Setup Time   Final    GRAM NEGATIVE RODS IN BOTH AEROBIC AND ANAEROBIC BOTTLES CRITICAL RESULT CALLED TO, READ BACK BY AND VERIFIED WITH: Leonie Green PHARMD AT [id number] 09/02/15 BY D. VANHOOK    Culture GRAM NEGATIVE RODS  Final   Report Status PENDING  Incomplete  Blood Culture ID Panel (Reflexed)     Status: Abnormal   Collection Time: 09/01/15  5:03 PM  Result Value Ref Range Status   Enterococcus species NOT DETECTED NOT DETECTED Final   Vancomycin resistance NOT DETECTED NOT DETECTED Final   Listeria monocytogenes NOT DETECTED NOT DETECTED Final   Staphylococcus species NOT DETECTED NOT DETECTED Final   Staphylococcus aureus NOT DETECTED NOT DETECTED Final   Methicillin resistance NOT DETECTED NOT DETECTED Final   Streptococcus species NOT DETECTED NOT DETECTED Final   Streptococcus agalactiae NOT DETECTED NOT DETECTED Final   Streptococcus pneumoniae NOT DETECTED NOT DETECTED Final   Streptococcus pyogenes NOT DETECTED NOT DETECTED Final   Acinetobacter baumannii NOT DETECTED NOT DETECTED Final   Enterobacteriaceae species DETECTED (A) NOT DETECTED Final     Comment: CRITICAL RESULT CALLED TO, READ BACK BY AND VERIFIED WITH: Leonie Green PHARMD AT [id number] 09/02/15 BY D. VANHOOK    Enterobacter cloacae complex NOT DETECTED NOT DETECTED Final   Escherichia coli DETECTED (A) NOT DETECTED Final    Comment: CRITICAL RESULT CALLED TO, READ BACK BY AND VERIFIED WITH: Leonie Green PHARMD AT 0925 09/02/15 BY D. VANHOOK    Klebsiella oxytoca NOT DETECTED NOT DETECTED Final   Klebsiella pneumoniae NOT DETECTED NOT DETECTED Final   Proteus species NOT DETECTED NOT DETECTED Final   Serratia marcescens NOT DETECTED NOT DETECTED Final   Carbapenem resistance NOT DETECTED NOT DETECTED Final   Haemophilus influenzae NOT DETECTED NOT DETECTED Final   Neisseria meningitidis NOT DETECTED NOT DETECTED Final   Pseudomonas aeruginosa NOT DETECTED NOT DETECTED Final   Candida albicans NOT DETECTED NOT DETECTED Final   Candida glabrata NOT DETECTED NOT DETECTED Final   Candida krusei NOT DETECTED NOT DETECTED Final   Candida parapsilosis NOT DETECTED NOT DETECTED Final   Candida tropicalis NOT DETECTED NOT DETECTED Final  Radiology Reports Dg Chest Port 1 View  Result Date: 09/01/2015 CLINICAL DATA:  65 year old female with left-sided chest pain for 1 week with cough and vomiting 1 day. EXAM: PORTABLE CHEST 1 VIEW COMPARISON:  01/11/2012 and prior chest radiographs FINDINGS: The cardiomediastinal silhouette is unremarkable. Mild peribronchial thickening is unchanged. There is no evidence of focal airspace disease, pulmonary edema, suspicious pulmonary nodule/mass, pleural effusion, or pneumothorax. No acute bony abnormalities are identified. IMPRESSION: No active disease. Electronically Signed   By: Margarette Canada M.D.   On: 09/01/2015 18:21    Jasin Brazel M.D on 09/02/2015 at 12:28 PM  Between 7am to 7pm - Pager - [phone]  After 7pm go to [email or web address] - password [facility]  [facility] -  Office  [phone]

## 2015-09-02 NOTE — Evaluation (Signed)
Physical Therapy Evaluation and Discharge  Patient Details Name: Victoria Jimenez MRN: [id number] DOB: [birth year] Today's Date: 09/02/2015   History of Present Illness  Pt is a 65 y/o F admitted with sepsis secondary to UTI/bacteremia.  Pt's PMH includes DM, CAD.  Clinical Impression  Pt admitted with above diagnosis. She prevents with pain beginning in Lt buttocks, down to her toes.  Suspect piriformis syndrome and recommending follow up in OPPT setting for assessment and treatment, CM notified.  PTA pt independent with all mobility and ADLs but limited ambulatory distance due to pain.  All PT needs can be met in the OPPT setting, PT is signing off acutely.    Follow Up Recommendations Outpatient PT    Equipment Recommendations  None recommended by PT    Recommendations for Other Services       Precautions / Restrictions Precautions Precautions: None Restrictions Weight Bearing Restrictions: No      Mobility  Bed Mobility Overal bed mobility: Independent                Transfers Overall transfer level: Independent Equipment used: None                Ambulation/Gait Ambulation/Gait assistance: Modified independent (Device/Increase time) Ambulation Distance (Feet): 40 Feet Assistive device: None Gait Pattern/deviations: Decreased stance time - left;Decreased step length - right;Antalgic     General Gait Details: Mildly antalgic gait due to pain Lt LE.  Pt requests to return to bed after ambulating 40 ft as her pastor has come to visit her.    Stairs            Wheelchair Mobility    Modified Rankin (Stroke Patients Only)       Balance Overall balance assessment: Modified Independent (Denies h/o falls)                                           Pertinent Vitals/Pain Pain Assessment: 0-10 Pain Score: 7  Pain Location: Lt buttocks down to toes Pain Descriptors / Indicators: Aching;Discomfort Pain Intervention(s):  Limited activity within patient's tolerance;Monitored during session    Home Living Family/patient expects to be discharged to:: Private residence Living Arrangements: Spouse/significant other;Other relatives (husband, sister) Available Help at Discharge: Family;Available 24 hours/day Type of Home: House Home Access: Stairs to enter Entrance Stairs-Rails: None Entrance Stairs-Number of Steps: 3 (2+1) Home Layout: One level Home Equipment: Cane - single point      Prior Function Level of Independence: Independent         Comments: Independent but limited in her ambulatory distance due to pain Lt LE      Hand Dominance        Extremity/Trunk Assessment   Upper Extremity Assessment: Overall WFL for tasks assessed           Lower Extremity Assessment: LLE deficits/detail   LLE Deficits / Details: Hip flexion 3/5     Communication   Communication: No difficulties  Cognition Arousal/Alertness: Awake/alert Behavior During Therapy: WFL for tasks assessed/performed Overall Cognitive Status: Within Functional Limits for tasks assessed                      General Comments General comments (skin integrity, edema, etc.): Pt reports she has been experiencing pain from Lt buttocks down to toes for a few months that has worsened.  It limits her  ambulatory distance to household distances due to pain.  It is more uncomfortable when pt is sitting as it feels like more pressure.  Pain is relieved with HOB flat compared to HOB elevated.  No directional preference with testing and negative slump test Bil LE.  Tender to Palpation superficially to glute med/max as well as deeper.  Suspect piriformis syndrome.  Educated pt on benefit of receiving OPPT evaluation for assessment and treatment of possible piriformis syndrome and educated pt on potential intervention options.  Made CM aware that pt will need OPPT at d/c.      Exercises        Assessment/Plan    PT Assessment  All further PT needs can be met in the next venue of care  PT Diagnosis Acute pain   PT Problem List Decreased strength;Pain  PT Treatment Interventions     PT Goals (Current goals can be found in the Care Plan section) Acute Rehab PT Goals Patient Stated Goal: decreased pain PT Goal Formulation: All assessment and education complete, DC therapy    Frequency     Barriers to discharge        Co-evaluation               End of Session   Activity Tolerance: Patient limited by pain;Patient tolerated treatment well Patient left: in bed;with call bell/phone within reach;with family/visitor present Nurse Communication: Mobility status    Functional Assessment Tool Used: Clinical Judgement Functional Limitation: Mobility: Walking and moving around Mobility: Walking and Moving Around Current Status (K8127): At least 1 percent but less than 20 percent impaired, limited or restricted (mod I with mobility due to pain) Mobility: Walking and Moving Around Goal Status [phone]): At least 1 percent but less than 20 percent impaired, limited or restricted Mobility: Walking and Moving Around Discharge Status [phone]): At least 1 percent but less than 20 percent impaired, limited or restricted (mod I with mobility due to pain)    Time: 4967-5916 PT Time Calculation (min) (ACUTE ONLY): 21 min   Charges:   PT Evaluation $PT Eval Low Complexity: 1 Procedure     PT G Codes:   PT G-Codes **NOT FOR INPATIENT CLASS** Functional Assessment Tool Used: Clinical Judgement Functional Limitation: Mobility: Walking and moving around Mobility: Walking and Moving Around Current Status (B8466): At least 1 percent but less than 20 percent impaired, limited or restricted (mod I with mobility due to pain) Mobility: Walking and Moving Around Goal Status [phone]): At least 1 percent but less than 20 percent impaired, limited or restricted Mobility: Walking and Moving Around Discharge Status [phone]): At least 1  percent but less than 20 percent impaired, limited or restricted (mod I with mobility due to pain)   Collie Siad PT, DPT  Pager: [phone] Phone: [phone] 09/02/2015, 1:50 PM

## 2015-09-02 NOTE — Progress Notes (Signed)
Patient placed her CPAP on herself with no complications. RT checked to ensure humidification chamber was filled. RT will continue to monitor as needed.

## 2015-09-02 NOTE — Progress Notes (Signed)
Patient arrived from [facility] to room 3e25. Admit with sepsis. SR on telemetry. A&O x 4. No complaints of pain or shortness of breath. No skin breakdown noted. Low fall risk. Patient oriented to room and call system.4

## 2015-09-03 LAB — URINE CULTURE

## 2015-09-03 LAB — GLUCOSE, CAPILLARY
GLUCOSE-CAPILLARY: 114 mg/dL — AB (ref 65–99)
GLUCOSE-CAPILLARY: 115 mg/dL — AB (ref 65–99)
GLUCOSE-CAPILLARY: 157 mg/dL — AB (ref 65–99)
Glucose-Capillary: 174 mg/dL — ABNORMAL HIGH (ref 65–99)

## 2015-09-03 LAB — BASIC METABOLIC PANEL
ANION GAP: 7 (ref 5–15)
BUN: 15 mg/dL (ref 6–20)
CALCIUM: 8.7 mg/dL — AB (ref 8.9–10.3)
CO2: 27 mmol/L (ref 22–32)
Chloride: 106 mmol/L (ref 101–111)
Creatinine, Ser: 0.97 mg/dL (ref 0.44–1.00)
GFR calc Af Amer: >60 mL/min (ref >60)
GLUCOSE: 144 mg/dL — AB (ref 65–99)
POTASSIUM: 4.5 mmol/L (ref 3.5–5.1)
SODIUM: 140 mmol/L (ref 135–145)

## 2015-09-03 LAB — CBC
HCT: 38.2 % (ref 36.0–46.0)
Hemoglobin: 12.1 g/dL (ref 12.0–15.0)
MCH: 26.7 pg (ref 26.0–34.0)
MCHC: 31.7 g/dL (ref 30.0–36.0)
MCV: 84.3 fL (ref 78.0–100.0)
PLATELETS: 236 10*3/uL (ref 150–400)
RBC: 4.53 MIL/uL (ref 3.87–5.11)
RDW: 13.9 % (ref 11.5–15.5)
WBC: 6.1 10*3/uL (ref 4.0–10.5)

## 2015-09-03 NOTE — Progress Notes (Signed)
Called by RN that pt needed helping with CPAP. When arrived CPAP lid was popped off and would not close and water was in the floor with towels overtop. RT unable to close CPAP lid so new CPAP unit was brought and replaced.

## 2015-09-03 NOTE — Progress Notes (Signed)
Patient stated she can place self on and off CPAP. RT made pt aware that if she needs help with CPAP to call.

## 2015-09-03 NOTE — Progress Notes (Signed)
PROGRESS NOTE                                                                                                                                                                                                             Patient Demographics:    Victoria Jimenez, is a 65 y.o. female, DOB - [birth year], ZX:[id number]  Admit date - 09/01/2015   Admitting Physician Vianne Bulls, MD  Outpatient Primary MD for the patient is Elizabeth Palau, MD (Inactive)  LOS - 1  Chief Complaint  Patient presents with  . Chills  . Shortness of Breath       Brief Narrative    65 y.o. female with medical history significant for hypertension, insulin-dependent diabetes mellitus, GERD, and OSA on CPAP , Admitted with sepsis secondary toEscherichia coli UTI/bacteremia.   Subjective:    Victoria Jimenez today has, No headache, No chest pain, No abdominal pain , Reports she is feeling much better today.   Assessment  & Plan :    Principal Problem:   Sepsis ([place]) Active Problems:   Hypertension   Insulin dependent diabetes mellitus ([place])   Obstructive sleep apnea   UTI (lower urinary tract infection)   GERD (gastroesophageal reflux disease)   Vaginal yeast infection   Piriformis syndrome of left side  Sepsis secondary to Escherichia coli UTI and bacteremia. - Presents febrile, tachycardic, and elevated lactate - Urine culture growing Escherichia coli, sensitive to Rocephin - Blood culture growing Escherichia coli, sensitivity pending - Continue with IV Rocephin. - Will recheck blood cultures - Lactic acid trending down, pro-calcitonin significantly elevated at 40  Insulin-dependent DM  - Managed at home with Invokamet, Levemir 5 units qHS, and Novolog 70/30 20 units BID ,  - Check CBG with meals and qHS , continue with Levemir, insulin sliding-scale, and 4 units before meals   HTN  - Managed with losartan, HCTZ, Toprol, and Norvasc at home ,  currently on Norvasc only secondary to labile blood pressure.  OSA - Stable, continue CPAP qHS    GERD - Stable, managed at home with Protonix, will continue    Vaginal yeast infection  - Pt reports frequent recurrence  - May need to reconsider Invokamet  - Treat with topical clotrimazole for 7 days   Piriformis syndrome, left  - Pain for several mos now, no weakness  or numbness; no LBP  -  might benefit from outpatient PT    Code Status : Full  Family Communication  : None at bedside  Disposition Plan  : Home once stable  Consults  :  None  Procedures  : none  DVT Prophylaxis  :  Lovenox   Lab Results  Component Value Date   PLT 236 09/03/2015    Antibiotics  :    Anti-infectives    Start     Dose/Rate Route Frequency Ordered Stop   09/02/15 1800  cefTRIAXone (ROCEPHIN) 2 g in dextrose 5 % 50 mL IVPB     2 g 100 mL/hr over 30 Minutes Intravenous Every 24 hours 09/01/15 1827     09/01/15 1815  cefTRIAXone (ROCEPHIN) 2 g in dextrose 5 % 50 mL IVPB     2 g 100 mL/hr over 30 Minutes Intravenous  Once 09/01/15 1807 09/01/15 1856        Objective:   Vitals:   09/03/15 0008 09/03/15 0505 09/03/15 0913 09/03/15 1129  BP: (!) 109/54 (!) 102/55 (!) 116/55 132/79  Pulse: 77 (!) 101 88 78  Resp: 18 18 18 18   Temp: 98.7 F (37.1 C) 98.4 F (36.9 C) 98.1 F (36.7 C)   TempSrc: Oral Oral Oral   SpO2: 99% 99% 96% 100%  Weight:  104.1 kg (229 lb 6.4 oz)    Height:        Wt Readings from Last 3 Encounters:  09/03/15 104.1 kg (229 lb 6.4 oz)  01/01/15 113.4 kg (250 lb)  12/20/14 113.8 kg (250 lb 12.8 oz)     Intake/Output Summary (Last 24 hours) at 09/03/15 1606 Last data filed at 09/03/15 1300  Gross per 24 hour  Intake             1288 ml  Output             4200 ml  Net            -2912 ml     Physical Exam  Awake Alert, Oriented X 3 Supple Neck,No JVD,   Symmetrical Chest wall movement, Good air movement bilaterally, CTAB RRR,No  Gallops,Rubs or new Murmurs, No Parasternal Heave +ve B.Sounds, Abd Soft, No tenderness No Cyanosis, Clubbing or edema, No new Rash or bruise      Data Review:    CBC  Recent Labs Lab 09/01/15 1703 09/02/15 0541 09/03/15 0347  WBC 6.1 13.2* 6.1  HGB 14.7 12.1 12.1  HCT 46.2* 37.8 38.2  PLT 258 241 236  MCV 84.3 83.8 84.3  MCH 26.8 26.8 26.7  MCHC 31.8 32.0 31.7  RDW 13.8 14.1 13.9  LYMPHSABS 0.7 2.2  --   MONOABS 0.0* 0.6  --   EOSABS 0.1 0.1  --   BASOSABS 0.0 0.0  --     Chemistries   Recent Labs Lab 09/01/15 1703 09/03/15 0347  NA 137 140  K 3.9 4.5  CL 101 106  CO2 24 27  GLUCOSE 144* 144*  BUN 17 15  CREATININE 1.06* 0.97  CALCIUM 9.4 8.7*  AST 27  --   ALT 21  --   ALKPHOS 89  --   BILITOT 0.9  --    ------------------------------------------------------------------------------------------------------------------ No results for input(s): CHOL, HDL, LDLCALC, TRIG, CHOLHDL, LDLDIRECT in the last 72 hours.  No results found for: HGBA1C ------------------------------------------------------------------------------------------------------------------ No results for input(s): TSH, T4TOTAL, T3FREE, THYROIDAB in the last 72 hours.  Invalid input(s): FREET3 ------------------------------------------------------------------------------------------------------------------ No results  for input(s): VITAMINB12, FOLATE, FERRITIN, TIBC, IRON, RETICCTPCT in the last 72 hours.  Coagulation profile  Recent Labs Lab 09/01/15 2259  INR 1.07    No results for input(s): DDIMER in the last 72 hours.  Cardiac Enzymes No results for input(s): CKMB, TROPONINI, MYOGLOBIN in the last 168 hours.  Invalid input(s): CK ------------------------------------------------------------------------------------------------------------------ No results found for: BNP  Inpatient Medications  Scheduled Meds: . amLODipine  10 mg Oral Daily   And  . atorvastatin  40 mg Oral  Daily  . aspirin EC  81 mg Oral Daily  . cefTRIAXone (ROCEPHIN)  IV  2 g Intravenous Q24H  . clotrimazole  1 Applicatorful Vaginal QHS  . enoxaparin (LOVENOX) injection  50 mg Subcutaneous Q24H  . insulin aspart  0-15 Units Subcutaneous TID WC  . insulin aspart  4 Units Subcutaneous TID WC  . insulin detemir  5 Units Subcutaneous QHS  . meloxicam  15 mg Oral Daily  . pantoprazole  40 mg Oral Daily  . sodium chloride flush  3 mL Intravenous Q12H   Continuous Infusions: . sodium chloride 75 mL/hr at 09/03/15 0619   PRN Meds:.acetaminophen **OR** acetaminophen, bisacodyl, HYDROcodone-acetaminophen, ondansetron **OR** ondansetron (ZOFRAN) IV, polyethylene glycol  Micro Results Recent Results (from the past 240 hour(s))  Culture, blood (routine x 2)     Status: Abnormal (Preliminary result)   Collection Time: 09/01/15  5:03 PM  Result Value Ref Range Status   Specimen Description RIGHT ANTECUBITAL  Final   Special Requests BOTTLES DRAWN AEROBIC AND ANAEROBIC 10CC  Final   Culture  Setup Time   Final    GRAM NEGATIVE RODS IN BOTH AEROBIC AND ANAEROBIC BOTTLES CRITICAL RESULT CALLED TO, READ BACK BY AND VERIFIED WITH: Leonie Green PHARMD AT [id number] 09/02/15 BY D. VANHOOK    Culture ESCHERICHIA COLI (A)  Final   Report Status PENDING  Incomplete  Blood Culture ID Panel (Reflexed)     Status: Abnormal   Collection Time: 09/01/15  5:03 PM  Result Value Ref Range Status   Enterococcus species NOT DETECTED NOT DETECTED Final   Vancomycin resistance NOT DETECTED NOT DETECTED Final   Listeria monocytogenes NOT DETECTED NOT DETECTED Final   Staphylococcus species NOT DETECTED NOT DETECTED Final   Staphylococcus aureus NOT DETECTED NOT DETECTED Final   Methicillin resistance NOT DETECTED NOT DETECTED Final   Streptococcus species NOT DETECTED NOT DETECTED Final   Streptococcus agalactiae NOT DETECTED NOT DETECTED Final   Streptococcus pneumoniae NOT DETECTED NOT DETECTED Final   Streptococcus  pyogenes NOT DETECTED NOT DETECTED Final   Acinetobacter baumannii NOT DETECTED NOT DETECTED Final   Enterobacteriaceae species DETECTED (A) NOT DETECTED Final    Comment: CRITICAL RESULT CALLED TO, READ BACK BY AND VERIFIED WITH: Leonie Green PHARMD AT [id number] 09/02/15 BY D. VANHOOK    Enterobacter cloacae complex NOT DETECTED NOT DETECTED Final   Escherichia coli DETECTED (A) NOT DETECTED Final    Comment: CRITICAL RESULT CALLED TO, READ BACK BY AND VERIFIED WITH: Leonie Green PHARMD AT 0925 09/02/15 BY D. VANHOOK    Klebsiella oxytoca NOT DETECTED NOT DETECTED Final   Klebsiella pneumoniae NOT DETECTED NOT DETECTED Final   Proteus species NOT DETECTED NOT DETECTED Final   Serratia marcescens NOT DETECTED NOT DETECTED Final   Carbapenem resistance NOT DETECTED NOT DETECTED Final   Haemophilus influenzae NOT DETECTED NOT DETECTED Final   Neisseria meningitidis NOT DETECTED NOT DETECTED Final   Pseudomonas aeruginosa NOT DETECTED NOT DETECTED Final   Candida albicans  NOT DETECTED NOT DETECTED Final   Candida glabrata NOT DETECTED NOT DETECTED Final   Candida krusei NOT DETECTED NOT DETECTED Final   Candida parapsilosis NOT DETECTED NOT DETECTED Final   Candida tropicalis NOT DETECTED NOT DETECTED Final  Urine culture     Status: Abnormal   Collection Time: 09/01/15  6:02 PM  Result Value Ref Range Status   Specimen Description URINE, RANDOM  Final   Special Requests NONE  Final   Culture >=100,000 COLONIES/mL ESCHERICHIA COLI (A)  Final   Report Status 09/03/2015 FINAL  Final   Organism ID, Bacteria ESCHERICHIA COLI (A)  Final      Susceptibility   Escherichia coli - MIC*    AMPICILLIN >=32 RESISTANT Resistant     CEFAZOLIN <=4 SENSITIVE Sensitive     CEFTRIAXONE <=1 SENSITIVE Sensitive     CIPROFLOXACIN <=0.25 SENSITIVE Sensitive     GENTAMICIN <=1 SENSITIVE Sensitive     IMIPENEM <=0.25 SENSITIVE Sensitive     NITROFURANTOIN <=16 SENSITIVE Sensitive     TRIMETH/SULFA <=20 SENSITIVE  Sensitive     AMPICILLIN/SULBACTAM >=32 RESISTANT Resistant     PIP/TAZO <=4 SENSITIVE Sensitive     * >=100,000 COLONIES/mL ESCHERICHIA COLI  Culture, blood (routine x 2)     Status: None (Preliminary result)   Collection Time: 09/01/15  8:49 PM  Result Value Ref Range Status   Specimen Description BLOOD RIGHT HAND  Final   Special Requests IN PEDIATRIC BOTTLE 5CC  Final   Culture NO GROWTH 2 DAYS  Final   Report Status PENDING  Incomplete    Radiology Reports Dg Chest Port 1 View  Result Date: 09/01/2015 CLINICAL DATA:  65 year old female with left-sided chest pain for 1 week with cough and vomiting 1 day. EXAM: PORTABLE CHEST 1 VIEW COMPARISON:  01/11/2012 and prior chest radiographs FINDINGS: The cardiomediastinal silhouette is unremarkable. Mild peribronchial thickening is unchanged. There is no evidence of focal airspace disease, pulmonary edema, suspicious pulmonary nodule/mass, pleural effusion, or pneumothorax. No acute bony abnormalities are identified. IMPRESSION: No active disease. Electronically Signed   By: Margarette Canada M.D.   On: 09/01/2015 18:21    Victoria Jimenez M.D on 09/03/2015 at 4:06 PM  Between 7am to 7pm - Pager - [phone]  After 7pm go to [email or web address] - password [facility]  [facility] -  Office  [phone]

## 2015-09-04 DIAGNOSIS — A4151 Sepsis due to Escherichia coli [E. coli]: Principal | ICD-10-CM

## 2015-09-04 DIAGNOSIS — K219 Gastro-esophageal reflux disease without esophagitis: Secondary | ICD-10-CM

## 2015-09-04 DIAGNOSIS — Z794 Long term (current) use of insulin: Secondary | ICD-10-CM

## 2015-09-04 DIAGNOSIS — I10 Essential (primary) hypertension: Secondary | ICD-10-CM

## 2015-09-04 DIAGNOSIS — G5702 Lesion of sciatic nerve, left lower limb: Secondary | ICD-10-CM

## 2015-09-04 DIAGNOSIS — B373 Candidiasis of vulva and vagina: Secondary | ICD-10-CM

## 2015-09-04 DIAGNOSIS — E119 Type 2 diabetes mellitus without complications: Secondary | ICD-10-CM

## 2015-09-04 DIAGNOSIS — N390 Urinary tract infection, site not specified: Secondary | ICD-10-CM

## 2015-09-04 DIAGNOSIS — G4733 Obstructive sleep apnea (adult) (pediatric): Secondary | ICD-10-CM

## 2015-09-04 LAB — HEMOGLOBIN A1C
Hgb A1c MFr Bld: 6.4 % — ABNORMAL HIGH (ref 4.8–5.6)
MEAN PLASMA GLUCOSE: 137 mg/dL

## 2015-09-04 LAB — CULTURE, BLOOD (ROUTINE X 2)

## 2015-09-04 LAB — GLUCOSE, CAPILLARY
GLUCOSE-CAPILLARY: 127 mg/dL — AB (ref 65–99)
GLUCOSE-CAPILLARY: 145 mg/dL — AB (ref 65–99)
Glucose-Capillary: 148 mg/dL — ABNORMAL HIGH (ref 65–99)
Glucose-Capillary: 191 mg/dL — ABNORMAL HIGH (ref 65–99)

## 2015-09-04 NOTE — Progress Notes (Signed)
CM talked to patient about Outpatient Physical Therapy; patient does not want this therapy at this time. CM informed her that if she changed her mind her PCP ( Dr Alphonzo Grieve at the [facility]) can make arrangement from his office. Mindi Slicker [facility] [phone]

## 2015-09-04 NOTE — Progress Notes (Signed)
Nutrition Brief Note  RD Consulted for assessment of nutrition status/recommendations.   Wt Readings from Last 15 Encounters:  09/04/15 228 lb 8 oz (103.6 kg)  01/01/15 250 lb (113.4 kg)  12/20/14 250 lb 12.8 oz (113.8 kg)  11/08/14 251 lb (113.9 kg)  10/29/14 250 lb 6.4 oz (113.6 kg)  06/18/14 248 lb 9.6 oz (112.8 kg)  05/23/14 247 lb (112 kg)  05/08/14 247 lb 9.6 oz (112.3 kg)  04/25/14 245 lb 1.9 oz (111.2 kg)  09/21/13 248 lb 6.4 oz (112.7 kg)  01/25/13 250 lb (113.4 kg)  01/25/13 252 lb 6.4 oz (114.5 kg)    Body mass index is 38.02 kg/m. Patient meets criteria for Obesity based on current BMI. Pt reports losing 22 lbs in the past several months. States that she has been trying to lose weight by trying to eat healthier by eating more fruits and vegetables and by eating less meat, fried foods, and desserts.   Current diet order is Carb Modified, patient is consuming approximately 75-100% of meals at this time. Pt reports having a good appetite and eating normally. Labs and medications reviewed.   No nutrition interventions warranted at this time. If nutrition issues arise, please consult RD.   Scarlette Ar RD, LDN Inpatient Clinical Dietitian Pager: [phone] After Hours Pager: [phone]

## 2015-09-04 NOTE — Progress Notes (Signed)
RT placed patient on CPAP. No O2 bleed in needed at this time. Pt is resting comfortably.

## 2015-09-04 NOTE — Progress Notes (Signed)
PROGRESS NOTE    Victoria Jimenez  [id number] DOB: 1950/09/29 DOA: 09/01/2015 PCP: Elizabeth Palau, MD (Inactive)   Brief Narrative:  HPI on 09/01/2015 by Dr. Christia Reading Opyd Victoria Jimenez is a 65 y.o. female with medical history significant for hypertension, insulin-dependent diabetes mellitus, GERD, and OSA on CPAP who presents the emergency department with fevers, chills, dysuria, and nausea. Patient reports that she had been in her usual state of health until insidious development of dysuria and vulvovaginal pruritus over the past couple days. Today, she experienced fevers with chills, reports that her "teeth were chattering." She reports recurrent vulvovaginal candidiasis and describes the current pruritus as consistent with her prior experiences with yeast infection. She notes that the dysuria she is experiencing is not typically present with the yeast infections. She has not been on any recent antibiotics and denies antibiotic allergy. She denies frequent UTI, instrumentation, catheter, history of stones, or flank pain. She denies chest pain or palpitations. She endorses dyspnea over the past day, but denies cough. There has been no lower extremity edema or orthopnea. She denies headaches, vision or hearing change, loss of coordination, or focal numbness or weakness. Patient also reports severe pain starting in the left buttock and radiating down the back of the left leg. She reports that this is been going on for months, is intermittent, severe, exacerbated by certain activities, and improved by adopting certain positions. She denies lower extremity numbness or weakness. She denies pain in the low back.  Assessment & Plan   Sepsis secondary to Escherichia coli UTI and bacteremia. -Upon admission patient was febrile, tachycardic, and elevated lactate -Urine culture: Escherichia coli, sensitive to Rocephin -09/01/2015 Blood culture 1/2  Escherichia coli, sensitive to  rocephin -Continue with IV Rocephin. -Repeat Blood cultures from 09/03/15 pending  -Lactic acid trending down  Insulin-dependent DM  -Managed at home with Invokamet, Levemir 5 units qHS, and Novolog 70/30 20 units BID ,  -Continue with Levemir, insulin sliding-scale, and 4 units before meals, and CBG monitoring   Essential hypertension -Managed with losartan, HCTZ, Toprol, and Norvasc at home  -currently on Norvasc only secondary to labile blood pressure.  OSA -Stable, continue CPAP qHS   GERD -Stable, continue protonix   Vaginal yeast infection  -Pt reports frequent recurrence  -May need to reconsider Invokamet  -Treat with topical clotrimazole for 7 days  -Spoke about good hygiene. Patient does use pads however not changing them frequently throughout the day.  Piriformis syndrome, left  -ongoing for several months, no weakness or numbness; no LBP  -PT recommended outpatient PT, however, patient refused.  DVT Prophylaxis  Lovenox  Code Status: Full  Family Communication: sister at bedside  Disposition Plan: Admitted. Likely discharge within 24-48 hours  Consultants Infectious disease, Dr. Baxter Flattery, via phone  Procedures  None  Antibiotics   Anti-infectives    Start     Dose/Rate Route Frequency Ordered Stop   09/02/15 1800  cefTRIAXone (ROCEPHIN) 2 g in dextrose 5 % 50 mL IVPB     2 g 100 mL/hr over 30 Minutes Intravenous Every 24 hours 09/01/15 1827     09/01/15 1815  cefTRIAXone (ROCEPHIN) 2 g in dextrose 5 % 50 mL IVPB     2 g 100 mL/hr over 30 Minutes Intravenous  Once 09/01/15 1807 09/01/15 1856      Subjective:   Victoria Jimenez seen and examined today.  Patient states she is feeling better. Continues to have some LLQ abdominal pain. Denies chest pain, Shortness  of breath, nausea, vomiting, diarrhea, constipation, dizziness or headache.   Objective:   Vitals:   09/03/15 1129 09/03/15 2008 09/04/15 0027 09/04/15 1206  BP: 132/79 133/73 116/61  137/84  Pulse: 78 75 79 81  Resp: 18 18 18    Temp:  98.8 F (37.1 C) 98.7 F (37.1 C) 97.9 F (36.6 C)  TempSrc:  Oral Oral Oral  SpO2: 100% 99% 98% 94%  Weight:   103.6 kg (228 lb 8 oz)   Height:        Intake/Output Summary (Last 24 hours) at 09/04/15 1312 Last data filed at 09/04/15 1300  Gross per 24 hour  Intake              600 ml  Output                0 ml  Net              600 ml   Filed Weights   09/02/15 0614 09/03/15 0505 09/04/15 0027  Weight: 104.4 kg (230 lb 1.6 oz) 104.1 kg (229 lb 6.4 oz) 103.6 kg (228 lb 8 oz)    Exam  General: Well developed, well nourished, NAD, appears stated age  HEENT: NCAT, mucous membranes moist.   Cardiovascular: S1 S2 auscultated, no rubs, murmurs or gallops. Regular rate and rhythm.  Respiratory: Clear to auscultation bilaterally with equal chest rise  Abdomen: Soft, obese, nontender, nondistended, + bowel sounds  Extremities: warm dry without cyanosis clubbing or edema  Neuro: AAOx3, nonfocal  Psych: Normal affect and demeanor with intact judgement and insight   Data Reviewed: I have personally reviewed following labs and imaging studies  CBC:  Recent Labs Lab 09/01/15 1703 09/02/15 0541 09/03/15 0347  WBC 6.1 13.2* 6.1  NEUTROABS 5.2 10.4*  --   HGB 14.7 12.1 12.1  HCT 46.2* 37.8 38.2  MCV 84.3 83.8 84.3  PLT 258 241 [id number]   Basic Metabolic Panel:  Recent Labs Lab 09/01/15 1703 09/03/15 0347  NA 137 140  K 3.9 4.5  CL 101 106  CO2 24 27  GLUCOSE 144* 144*  BUN 17 15  CREATININE 1.06* 0.97  CALCIUM 9.4 8.7*   GFR: Estimated Creatinine Clearance: 71.8 mL/min (by C-G formula based on SCr of 0.97 mg/dL). Liver Function Tests:  Recent Labs Lab 09/01/15 1703  AST 27  ALT 21  ALKPHOS 89  BILITOT 0.9  PROT 8.0  ALBUMIN 4.2   No results for input(s): LIPASE, AMYLASE in the last 168 hours. No results for input(s): AMMONIA in the last 168 hours. Coagulation Profile:  Recent Labs Lab  09/01/15 2259  INR 1.07   Cardiac Enzymes: No results for input(s): CKTOTAL, CKMB, CKMBINDEX, TROPONINI in the last 168 hours. BNP (last 3 results) No results for input(s): PROBNP in the last 8760 hours. HbA1C:  Recent Labs  09/03/15 0347  HGBA1C 6.4*   CBG:  Recent Labs Lab 09/03/15 1129 09/03/15 1631 09/03/15 2041 09/04/15 0552 09/04/15 1125  GLUCAP 174* 115* 157* 127* 148*   Lipid Profile: No results for input(s): CHOL, HDL, LDLCALC, TRIG, CHOLHDL, LDLDIRECT in the last 72 hours. Thyroid Function Tests: No results for input(s): TSH, T4TOTAL, FREET4, T3FREE, THYROIDAB in the last 72 hours. Anemia Panel: No results for input(s): VITAMINB12, FOLATE, FERRITIN, TIBC, IRON, RETICCTPCT in the last 72 hours. Urine analysis:    Component Value Date/Time   COLORURINE YELLOW 09/01/2015 1802   APPEARANCEUR CLOUDY (A) 09/01/2015 1802   LABSPEC 1.018 09/01/2015 1802  PHURINE 6.0 09/01/2015 1802   GLUCOSEU >1000 (A) 09/01/2015 1802   HGBUR LARGE (A) 09/01/2015 1802   BILIRUBINUR NEGATIVE 09/01/2015 1802   KETONESUR NEGATIVE 09/01/2015 1802   PROTEINUR 30 (A) 09/01/2015 1802   NITRITE POSITIVE (A) 09/01/2015 1802   LEUKOCYTESUR MODERATE (A) 09/01/2015 1802   Sepsis Labs: @LABRCNTIP (procalcitonin:4,lacticidven:4)  ) Recent Results (from the past 240 hour(s))  Culture, blood (routine x 2)     Status: Abnormal   Collection Time: 09/01/15  5:03 PM  Result Value Ref Range Status   Specimen Description RIGHT ANTECUBITAL  Final   Special Requests BOTTLES DRAWN AEROBIC AND ANAEROBIC 10CC  Final   Culture  Setup Time   Final    GRAM NEGATIVE RODS IN BOTH AEROBIC AND ANAEROBIC BOTTLES CRITICAL RESULT CALLED TO, READ BACK BY AND VERIFIED WITH: Leonie Green PHARMD AT [id number] 09/02/15 BY D. VANHOOK    Culture ESCHERICHIA COLI (A)  Final   Report Status 09/04/2015 FINAL  Final   Organism ID, Bacteria ESCHERICHIA COLI  Final      Susceptibility   Escherichia coli - MIC*    AMPICILLIN  >=32 RESISTANT Resistant     CEFAZOLIN <=4 SENSITIVE Sensitive     CEFEPIME <=1 SENSITIVE Sensitive     CEFTAZIDIME <=1 SENSITIVE Sensitive     CEFTRIAXONE <=1 SENSITIVE Sensitive     CIPROFLOXACIN <=0.25 SENSITIVE Sensitive     GENTAMICIN <=1 SENSITIVE Sensitive     IMIPENEM <=0.25 SENSITIVE Sensitive     TRIMETH/SULFA <=20 SENSITIVE Sensitive     AMPICILLIN/SULBACTAM 16 INTERMEDIATE Intermediate     PIP/TAZO <=4 SENSITIVE Sensitive     * ESCHERICHIA COLI  Blood Culture ID Panel (Reflexed)     Status: Abnormal   Collection Time: 09/01/15  5:03 PM  Result Value Ref Range Status   Enterococcus species NOT DETECTED NOT DETECTED Final   Vancomycin resistance NOT DETECTED NOT DETECTED Final   Listeria monocytogenes NOT DETECTED NOT DETECTED Final   Staphylococcus species NOT DETECTED NOT DETECTED Final   Staphylococcus aureus NOT DETECTED NOT DETECTED Final   Methicillin resistance NOT DETECTED NOT DETECTED Final   Streptococcus species NOT DETECTED NOT DETECTED Final   Streptococcus agalactiae NOT DETECTED NOT DETECTED Final   Streptococcus pneumoniae NOT DETECTED NOT DETECTED Final   Streptococcus pyogenes NOT DETECTED NOT DETECTED Final   Acinetobacter baumannii NOT DETECTED NOT DETECTED Final   Enterobacteriaceae species DETECTED (A) NOT DETECTED Final    Comment: CRITICAL RESULT CALLED TO, READ BACK BY AND VERIFIED WITH: Leonie Green PHARMD AT [id number] 09/02/15 BY D. VANHOOK    Enterobacter cloacae complex NOT DETECTED NOT DETECTED Final   Escherichia coli DETECTED (A) NOT DETECTED Final    Comment: CRITICAL RESULT CALLED TO, READ BACK BY AND VERIFIED WITH: Leonie Green PHARMD AT 0925 09/02/15 BY D. VANHOOK    Klebsiella oxytoca NOT DETECTED NOT DETECTED Final   Klebsiella pneumoniae NOT DETECTED NOT DETECTED Final   Proteus species NOT DETECTED NOT DETECTED Final   Serratia marcescens NOT DETECTED NOT DETECTED Final   Carbapenem resistance NOT DETECTED NOT DETECTED Final   Haemophilus  influenzae NOT DETECTED NOT DETECTED Final   Neisseria meningitidis NOT DETECTED NOT DETECTED Final   Pseudomonas aeruginosa NOT DETECTED NOT DETECTED Final   Candida albicans NOT DETECTED NOT DETECTED Final   Candida glabrata NOT DETECTED NOT DETECTED Final   Candida krusei NOT DETECTED NOT DETECTED Final   Candida parapsilosis NOT DETECTED NOT DETECTED Final   Candida tropicalis NOT DETECTED  NOT DETECTED Final  Urine culture     Status: Abnormal   Collection Time: 09/01/15  6:02 PM  Result Value Ref Range Status   Specimen Description URINE, RANDOM  Final   Special Requests NONE  Final   Culture >=100,000 COLONIES/mL ESCHERICHIA COLI (A)  Final   Report Status 09/03/2015 FINAL  Final   Organism ID, Bacteria ESCHERICHIA COLI (A)  Final      Susceptibility   Escherichia coli - MIC*    AMPICILLIN >=32 RESISTANT Resistant     CEFAZOLIN <=4 SENSITIVE Sensitive     CEFTRIAXONE <=1 SENSITIVE Sensitive     CIPROFLOXACIN <=0.25 SENSITIVE Sensitive     GENTAMICIN <=1 SENSITIVE Sensitive     IMIPENEM <=0.25 SENSITIVE Sensitive     NITROFURANTOIN <=16 SENSITIVE Sensitive     TRIMETH/SULFA <=20 SENSITIVE Sensitive     AMPICILLIN/SULBACTAM >=32 RESISTANT Resistant     PIP/TAZO <=4 SENSITIVE Sensitive     * >=100,000 COLONIES/mL ESCHERICHIA COLI  Culture, blood (routine x 2)     Status: None (Preliminary result)   Collection Time: 09/01/15  8:49 PM  Result Value Ref Range Status   Specimen Description BLOOD RIGHT HAND  Final   Special Requests IN PEDIATRIC BOTTLE 5CC  Final   Culture NO GROWTH 2 DAYS  Final   Report Status PENDING  Incomplete      Radiology Studies: No results found.   Scheduled Meds: . amLODipine  10 mg Oral Daily   And  . atorvastatin  40 mg Oral Daily  . aspirin EC  81 mg Oral Daily  . cefTRIAXone (ROCEPHIN)  IV  2 g Intravenous Q24H  . clotrimazole  1 Applicatorful Vaginal QHS  . enoxaparin (LOVENOX) injection  50 mg Subcutaneous Q24H  . insulin aspart  0-15  Units Subcutaneous TID WC  . insulin aspart  4 Units Subcutaneous TID WC  . insulin detemir  5 Units Subcutaneous QHS  . meloxicam  15 mg Oral Daily  . pantoprazole  40 mg Oral Daily  . sodium chloride flush  3 mL Intravenous Q12H   Continuous Infusions: . sodium chloride 75 mL/hr at 09/03/15 0619     LOS: 2 days   Time Spent in minutes   30 minutes  Jamison Yuhasz D.O. on 09/04/2015 at 1:12 PM  Between 7am to 7pm - Pager - [phone]  After 7pm go to [email or web address] - password [email or web address]  And look for the night coverage person covering for me after hours  [facility] Office  [phone]

## 2015-09-05 LAB — GLUCOSE, CAPILLARY
GLUCOSE-CAPILLARY: 173 mg/dL — AB (ref 65–99)
Glucose-Capillary: 132 mg/dL — ABNORMAL HIGH (ref 65–99)

## 2015-09-05 MED ORDER — CLOTRIMAZOLE 1 % VA CREA: 1.0000 | TOPICAL_CREAM | Freq: Every day | VAGINAL | 0 refills | Status: DC

## 2015-09-05 MED ORDER — POLYETHYLENE GLYCOL 3350 17 G PO PACK: 17.0000 g | PACK | Freq: Every day | ORAL | 0 refills | Status: DC | PRN

## 2015-09-05 MED ORDER — BISACODYL 5 MG PO TBEC: 5.0000 mg | DELAYED_RELEASE_TABLET | Freq: Every day | ORAL | 0 refills | Status: DC | PRN

## 2015-09-05 MED ORDER — INSULIN ASPART 100 UNIT/ML ~~LOC~~ SOLN
0.0000 [IU] | Freq: Three times a day (TID) | SUBCUTANEOUS | Status: DC
  Administered 2015-09-05: 3 [IU] via SUBCUTANEOUS
  Administered 2015-09-05: 2 [IU] via SUBCUTANEOUS

## 2015-09-05 MED ORDER — LEVOFLOXACIN 750 MG PO TABS: 750.0000 mg | ORAL_TABLET | Freq: Every day | ORAL | 0 refills | Status: DC

## 2015-09-05 NOTE — Progress Notes (Signed)
Patient discharged to home accompanied by patient's daughter and volunteer staff via wheelchair. Discharge instructions given. Patient verbalized understanding. All personal belongings given. Telemetry box and IV removed prior to discharge.

## 2015-09-05 NOTE — Discharge Summary (Signed)
Physician Discharge Summary  Victoria Jimenez [id number] DOB: 1950-08-08 DOA: 09/01/2015  PCP: Elizabeth Palau, MD (Inactive)  Admit date: 09/01/2015 Discharge date: 09/05/2015  Time spent: 45 minutes  Recommendations for Outpatient Follow-up:  Patient will be discharged to home.  Patient will need to follow up with primary care provider within one week of discharge, discuss blood pressure medications.  Patient should continue medications as prescribed.  Patient should follow a carb modified diet.   Discharge Diagnoses:  Principal Problem:   Sepsis ([facility]) Active Problems:   Hypertension   Insulin dependent diabetes mellitus (HCC)   Obstructive sleep apnea   UTI (lower urinary tract infection)   GERD (gastroesophageal reflux disease)   Vaginal yeast infection   Piriformis syndrome of left side   Discharge Condition: Stable  Diet recommendation: carb modified  Filed Weights   09/03/15 0505 09/04/15 0027 09/05/15 0514  Weight: 104.1 kg (229 lb 6.4 oz) 103.6 kg (228 lb 8 oz) 103.5 kg (228 lb 3.2 oz)    History of present illness:  on 09/01/2015 by Dr. Gerrit Friends D Pattersonis a 65 y.o.femalewith medical history significant for hypertension, insulin-dependent diabetes mellitus, GERD, and OSA on CPAP who presents the emergency department with fevers, chills, dysuria, and nausea. Patient reports that she had been in her usual state of health until insidious development of dysuria and vulvovaginal pruritus over the past couple days. Today, she experienced fevers with chills, reports that her "teeth were chattering." She reports recurrent vulvovaginal candidiasis and describes the current pruritus as consistent with her prior experiences with yeast infection. She notes that the dysuria she is experiencing is not typically present with the yeast infections. She has not been on any recent antibiotics and denies antibiotic allergy. She denies frequent UTI,  instrumentation, catheter, history of stones, or flank pain. She denies chest pain or palpitations. She endorses dyspnea over the past day, but denies cough. There has been no lower extremity edema or orthopnea. She denies headaches, vision or hearing change, loss of coordination, or focal numbness or weakness. Patient also reports severe pain starting in the left buttock and radiating down the back of the left leg. She reports that this is been going on for months, is intermittent, severe, exacerbated by certain activities, and improved by adopting certain positions. She denies lower extremity numbness or weakness. She denies pain in the low back.  Hospital Course:  Sepsis secondary to Escherichia coli UTI and bacteremia. -Upon admission patient was febrile, tachycardic, and elevated lactate -Urine culture: Escherichia coli, sensitive to Rocephin -09/01/2015 Blood culture 1/2  Escherichia coli, sensitive to rocephin -was palced on IV Rocephin. -Repeat Blood cultures from 09/03/15 show no growth to date -Lactic acid trending down -Spoke with Dr. Baxter Flattery, ID, who recommended levaquin for a total of 7-10 days of antibiotics. (oral cephalosporins do not penetrate blood well for bacteremia coverage)  Insulin-dependent DM  -Managed at home with Invokamet, Levemir 5 units qHS, and Novolog 70/30 20 units BID  -Continue home regiment at discharge -During hospitalization, patient was on Levemir, insulin sliding-scale, and 4 units before meals, and CBG monitoring   Essential hypertension -Managed with losartan, HCTZ, Toprol, and Norvasc at home  -Only norvasc was continue during hospitalization as BP was liable.   -Patient should discuss BP regiment with PCP as she may not need this many medications.  OSA -Stable, continue CPAP qHS   GERD -Stable, continue protonix   Vaginal yeast infection  -Pt reports frequent recurrence  -May need to reconsider  Invokamet  -Treat with topical clotrimazole  for 7 days  -Spoke about good hygiene. Patient does use pads however not changing them frequently throughout the day.  Piriformis syndrome, left  -ongoing for several months, no weakness or numbness; no LBP  -PT recommended outpatient PT, however, patient refused.  Consultants Infectious disease, Dr. Baxter Flattery, via phone  Procedures  None  Discharge Exam: Vitals:   09/05/15 0514 09/05/15 0947  BP: (!) 118/59 127/61  Pulse: 80   Resp: 20   Temp: 97.5 F (36.4 C)    Exam  General: Well developed, well nourished, NAD, appears stated age  HEENT: NCAT, mucous membranes moist.   Cardiovascular: S1 S2 auscultated, no murmurs, RRR  Respiratory: Clear to auscultation bilaterally with equal chest rise  Abdomen: Soft, obese, nontender, nondistended, + bowel sounds  Extremities: warm dry without cyanosis clubbing or edema  Neuro: AAOx3, nonfocal  Psych: Normal affect and demeanor with intact judgement and insight, p lesant  Discharge Instructions  Discharge Instructions    Discharge instructions    Complete by:  As directed   Patient will be discharged to home.  Patient will need to follow up with primary care provider within one week of discharge, discuss blood pressure medications.  Patient should continue medications as prescribed.  Patient should follow a carb modified diet.   You were cared for by a hospitalist during your hospital stay. If you have any questions about your discharge medications or the care you received while you were in the hospital after you are discharged, you can call the unit and asked to speak with the hospitalist on call if the hospitalist that took care of you is not available. Once you are discharged, your primary care physician will handle any further medical issues. Please note that NO REFILLS for any discharge medications will be authorized once you are discharged, as it is imperative that you return to your primary care physician (or establish a  relationship with a primary care physician if you do not have one) for your aftercare needs so that they can reassess your need for medications and monitor your lab values.       Medication List    STOP taking these medications   hydrochlorothiazide 25 MG tablet Commonly known as:  HYDRODIURIL   losartan 100 MG tablet Commonly known as:  COZAAR   metoprolol succinate 25 MG 24 hr tablet Commonly known as:  TOPROL XL     TAKE these medications   ACCU-CHEK AVIVA PLUS test strip Generic drug:  glucose blood As directed   acetaminophen 325 MG tablet Commonly known as:  TYLENOL Take 650 mg by mouth every 6 (six) hours as needed.   amLODipine-atorvastatin 10-40 MG tablet Commonly known as:  CADUET Take 1 tablet by mouth daily.   aspirin EC 81 MG tablet Take 81 mg by mouth daily.   ULTICARE SHORT PEN NEEDLES 31G X 8 MM Misc Generic drug:  Insulin Pen Needle As directed   BD PEN NEEDLE NANO U/F 32G X 4 MM Misc Generic drug:  Insulin Pen Needle See admin instructions. As directed   bisacodyl 5 MG EC tablet Commonly known as:  DULCOLAX Take 1 tablet (5 mg total) by mouth daily as needed for moderate constipation.   clotrimazole 1 % vaginal cream Commonly known as:  GYNE-LOTRIMIN Place 1 Applicatorful vaginally at bedtime. Use through 09/09/2015.   HUMULIN N KWIKPEN 100 UNIT/ML Kiwkpen Generic drug:  Insulin NPH (Human) (Isophane) Inject 30 Units into the skin  2 (two) times daily.   insulin aspart protamine- aspart (70-30) 100 UNIT/ML injection Commonly known as:  NOVOLOG MIX 70/30 Inject 20 Units into the skin 2 (two) times daily with a meal.   INVOKAMET 150-500 MG Tabs Generic drug:  Canagliflozin-Metformin HCl Take 1 tablet by mouth 2 (two) times daily.   LEVEMIR FLEXTOUCH 100 UNIT/ML Pen Generic drug:  Insulin Detemir Inject 5 Units into the skin at bedtime.   levofloxacin 750 MG tablet Commonly known as:  LEVAQUIN Take 1 tablet (750 mg total) by mouth daily.  Start 09/06/2015 Start taking on:  09/06/2015   meloxicam 15 MG tablet Commonly known as:  MOBIC Take 15 mg by mouth daily.   pantoprazole 40 MG tablet Commonly known as:  PROTONIX Take 40 mg by mouth daily.   PATADAY 0.2 % Soln Generic drug:  Olopatadine HCl Place 2 drops into both eyes 2 (two) times daily.   phentermine 37.5 MG tablet Commonly known as:  ADIPEX-P Take 37.5 mg by mouth daily.   polyethylene glycol packet Commonly known as:  MIRALAX / GLYCOLAX Take 17 g by mouth daily as needed for mild constipation.   POTASSIUM PO Take 1-2 tablets by mouth daily as needed (for cramping (legs & feet)).      No Known Allergies    The results of significant diagnostics from this hospitalization (including imaging, microbiology, ancillary and laboratory) are listed below for reference.    Significant Diagnostic Studies: Dg Chest Port 1 View  Result Date: 09/01/2015 CLINICAL DATA:  65 year old female with left-sided chest pain for 1 week with cough and vomiting 1 day. EXAM: PORTABLE CHEST 1 VIEW COMPARISON:  01/11/2012 and prior chest radiographs FINDINGS: The cardiomediastinal silhouette is unremarkable. Mild peribronchial thickening is unchanged. There is no evidence of focal airspace disease, pulmonary edema, suspicious pulmonary nodule/mass, pleural effusion, or pneumothorax. No acute bony abnormalities are identified. IMPRESSION: No active disease. Electronically Signed   By: Margarette Canada M.D.   On: 09/01/2015 18:21   Microbiology: Recent Results (from the past 240 hour(s))  Culture, blood (routine x 2)     Status: Abnormal   Collection Time: 09/01/15  5:03 PM  Result Value Ref Range Status   Specimen Description RIGHT ANTECUBITAL  Final   Special Requests BOTTLES DRAWN AEROBIC AND ANAEROBIC 10CC  Final   Culture  Setup Time   Final    GRAM NEGATIVE RODS IN BOTH AEROBIC AND ANAEROBIC BOTTLES CRITICAL RESULT CALLED TO, READ BACK BY AND VERIFIED WITH: Leonie Green PHARMD AT  [id number] 09/02/15 BY D. VANHOOK    Culture ESCHERICHIA COLI (A)  Final   Report Status 09/04/2015 FINAL  Final   Organism ID, Bacteria ESCHERICHIA COLI  Final      Susceptibility   Escherichia coli - MIC*    AMPICILLIN >=32 RESISTANT Resistant     CEFAZOLIN <=4 SENSITIVE Sensitive     CEFEPIME <=1 SENSITIVE Sensitive     CEFTAZIDIME <=1 SENSITIVE Sensitive     CEFTRIAXONE <=1 SENSITIVE Sensitive     CIPROFLOXACIN <=0.25 SENSITIVE Sensitive     GENTAMICIN <=1 SENSITIVE Sensitive     IMIPENEM <=0.25 SENSITIVE Sensitive     TRIMETH/SULFA <=20 SENSITIVE Sensitive     AMPICILLIN/SULBACTAM 16 INTERMEDIATE Intermediate     PIP/TAZO <=4 SENSITIVE Sensitive     * ESCHERICHIA COLI  Blood Culture ID Panel (Reflexed)     Status: Abnormal   Collection Time: 09/01/15  5:03 PM  Result Value Ref Range Status   Enterococcus species  NOT DETECTED NOT DETECTED Final   Vancomycin resistance NOT DETECTED NOT DETECTED Final   Listeria monocytogenes NOT DETECTED NOT DETECTED Final   Staphylococcus species NOT DETECTED NOT DETECTED Final   Staphylococcus aureus NOT DETECTED NOT DETECTED Final   Methicillin resistance NOT DETECTED NOT DETECTED Final   Streptococcus species NOT DETECTED NOT DETECTED Final   Streptococcus agalactiae NOT DETECTED NOT DETECTED Final   Streptococcus pneumoniae NOT DETECTED NOT DETECTED Final   Streptococcus pyogenes NOT DETECTED NOT DETECTED Final   Acinetobacter baumannii NOT DETECTED NOT DETECTED Final   Enterobacteriaceae species DETECTED (A) NOT DETECTED Final    Comment: CRITICAL RESULT CALLED TO, READ BACK BY AND VERIFIED WITH: Leonie Green PHARMD AT [id number] 09/02/15 BY D. VANHOOK    Enterobacter cloacae complex NOT DETECTED NOT DETECTED Final   Escherichia coli DETECTED (A) NOT DETECTED Final    Comment: CRITICAL RESULT CALLED TO, READ BACK BY AND VERIFIED WITH: Leonie Green PHARMD AT 0925 09/02/15 BY D. VANHOOK    Klebsiella oxytoca NOT DETECTED NOT DETECTED Final   Klebsiella  pneumoniae NOT DETECTED NOT DETECTED Final   Proteus species NOT DETECTED NOT DETECTED Final   Serratia marcescens NOT DETECTED NOT DETECTED Final   Carbapenem resistance NOT DETECTED NOT DETECTED Final   Haemophilus influenzae NOT DETECTED NOT DETECTED Final   Neisseria meningitidis NOT DETECTED NOT DETECTED Final   Pseudomonas aeruginosa NOT DETECTED NOT DETECTED Final   Candida albicans NOT DETECTED NOT DETECTED Final   Candida glabrata NOT DETECTED NOT DETECTED Final   Candida krusei NOT DETECTED NOT DETECTED Final   Candida parapsilosis NOT DETECTED NOT DETECTED Final   Candida tropicalis NOT DETECTED NOT DETECTED Final  Urine culture     Status: Abnormal   Collection Time: 09/01/15  6:02 PM  Result Value Ref Range Status   Specimen Description URINE, RANDOM  Final   Special Requests NONE  Final   Culture >=100,000 COLONIES/mL ESCHERICHIA COLI (A)  Final   Report Status 09/03/2015 FINAL  Final   Organism ID, Bacteria ESCHERICHIA COLI (A)  Final      Susceptibility   Escherichia coli - MIC*    AMPICILLIN >=32 RESISTANT Resistant     CEFAZOLIN <=4 SENSITIVE Sensitive     CEFTRIAXONE <=1 SENSITIVE Sensitive     CIPROFLOXACIN <=0.25 SENSITIVE Sensitive     GENTAMICIN <=1 SENSITIVE Sensitive     IMIPENEM <=0.25 SENSITIVE Sensitive     NITROFURANTOIN <=16 SENSITIVE Sensitive     TRIMETH/SULFA <=20 SENSITIVE Sensitive     AMPICILLIN/SULBACTAM >=32 RESISTANT Resistant     PIP/TAZO <=4 SENSITIVE Sensitive     * >=100,000 COLONIES/mL ESCHERICHIA COLI  Culture, blood (routine x 2)     Status: None (Preliminary result)   Collection Time: 09/01/15  8:49 PM  Result Value Ref Range Status   Specimen Description BLOOD RIGHT HAND  Final   Special Requests IN PEDIATRIC BOTTLE 5CC  Final   Culture NO GROWTH 3 DAYS  Final   Report Status PENDING  Incomplete  Culture, blood (Routine X 2) w Reflex to ID Panel     Status: None (Preliminary result)   Collection Time: 09/03/15  4:50 PM  Result  Value Ref Range Status   Specimen Description BLOOD RIGHT HAND  Final   Special Requests BOTTLES DRAWN AEROBIC AND ANAEROBIC 10CC   Final   Culture NO GROWTH < 24 HOURS  Final   Report Status PENDING  Incomplete  Culture, blood (Routine X 2) w Reflex to  ID Panel     Status: None (Preliminary result)   Collection Time: 09/03/15  5:05 PM  Result Value Ref Range Status   Specimen Description BLOOD RIGHT ANTECUBITAL  Final   Special Requests BOTTLES DRAWN AEROBIC AND ANAEROBIC 10CC   Final   Culture NO GROWTH < 24 HOURS  Final   Report Status PENDING  Incomplete     Labs: Basic Metabolic Panel:  Recent Labs Lab 09/01/15 1703 09/03/15 0347  NA 137 140  K 3.9 4.5  CL 101 106  CO2 24 27  GLUCOSE 144* 144*  BUN 17 15  CREATININE 1.06* 0.97  CALCIUM 9.4 8.7*   Liver Function Tests:  Recent Labs Lab 09/01/15 1703  AST 27  ALT 21  ALKPHOS 89  BILITOT 0.9  PROT 8.0  ALBUMIN 4.2   No results for input(s): LIPASE, AMYLASE in the last 168 hours. No results for input(s): AMMONIA in the last 168 hours. CBC:  Recent Labs Lab 09/01/15 1703 09/02/15 0541 09/03/15 0347  WBC 6.1 13.2* 6.1  NEUTROABS 5.2 10.4*  --   HGB 14.7 12.1 12.1  HCT 46.2* 37.8 38.2  MCV 84.3 83.8 84.3  PLT 258 241 236   Cardiac Enzymes: No results for input(s): CKTOTAL, CKMB, CKMBINDEX, TROPONINI in the last 168 hours. BNP: BNP (last 3 results) No results for input(s): BNP in the last 8760 hours.  ProBNP (last 3 results) No results for input(s): PROBNP in the last 8760 hours.  CBG:  Recent Labs Lab 09/04/15 0552 09/04/15 1125 09/04/15 1609 09/04/15 2103 09/05/15 0637  GLUCAP 127* 148* 145* 191* 132*       Signed:  Anselma Herbel  [facility] 09/05/2015, 11:18 AM

## 2015-09-05 NOTE — Discharge Instructions (Signed)

## 2015-09-06 LAB — CULTURE, BLOOD (ROUTINE X 2): Culture: NO GROWTH

## 2015-09-08 LAB — CULTURE, BLOOD (ROUTINE X 2)
Culture: NO GROWTH
Culture: NO GROWTH

## 2015-10-07 ENCOUNTER — Other Ambulatory Visit: Payer: Self-pay | Admitting: Interventional Cardiology

## 2015-10-08 ENCOUNTER — Telehealth: Payer: Self-pay | Admitting: Cardiology

## 2015-10-08 NOTE — Telephone Encounter (Signed)
Pt called requesting an appt. urgently with Dr. Radford Pax due to her medicaid insurance ending at the end of the month. Pt was offered appt. 10/24 but she declined. Offered 9/29 w/ pa but pt declined. I called pt. Pt stated everything was going great using her C-PAP machine, and she is continues to use it. The needed appt. was just for a follow-up to let Dr. Radford Pax know how pt was doing with the C-PAP. I informed her I would forward this information to Dr. Radford Pax and Lenice Llamas, RN. If appt. needed or additional information needed, Lenice Llamas, RN will follow up with pt. Pt verbalized understanding of information.

## 2015-10-08 NOTE — Telephone Encounter (Signed)
Patient does not need to be seen urgently.  I have spoken with her 7and all is well.  Follow-up appointment made for 12/03/15

## 2015-10-08 NOTE — Telephone Encounter (Signed)
New message      Pt calling to get an appt today or tomorrow. She states she needs an appt urgently with Dr. Radford Pax due to her medicaid insurance ending at the end of the month. I offered her the 1st available which was 10/24 but she declined. Offered 9/29 w/ pa but pt declined. Please call.

## 2015-10-15 ENCOUNTER — Other Ambulatory Visit: Payer: Self-pay | Admitting: Interventional Cardiology

## 2015-12-03 ENCOUNTER — Ambulatory Visit: Payer: PRIVATE HEALTH INSURANCE | Admitting: Cardiology

## 2016-01-08 ENCOUNTER — Ambulatory Visit: Payer: PRIVATE HEALTH INSURANCE | Admitting: Cardiology

## 2016-01-29 ENCOUNTER — Other Ambulatory Visit: Payer: Self-pay | Admitting: Specialist

## 2016-01-29 DIAGNOSIS — Z1231 Encounter for screening mammogram for malignant neoplasm of breast: Secondary | ICD-10-CM

## 2016-02-19 ENCOUNTER — Encounter: Payer: Self-pay | Admitting: Cardiology

## 2016-02-20 ENCOUNTER — Encounter: Payer: Self-pay | Admitting: Cardiology

## 2016-02-20 ENCOUNTER — Encounter (INDEPENDENT_AMBULATORY_CARE_PROVIDER_SITE_OTHER): Payer: Self-pay

## 2016-02-20 ENCOUNTER — Ambulatory Visit (INDEPENDENT_AMBULATORY_CARE_PROVIDER_SITE_OTHER): Payer: PRIVATE HEALTH INSURANCE | Admitting: Cardiology

## 2016-02-20 VITALS — BP 108/60 | HR 88 | Ht 65.0 in | Wt 215.8 lb

## 2016-02-20 DIAGNOSIS — I10 Essential (primary) hypertension: Secondary | ICD-10-CM

## 2016-02-20 DIAGNOSIS — E669 Obesity, unspecified: Secondary | ICD-10-CM

## 2016-02-20 DIAGNOSIS — G4733 Obstructive sleep apnea (adult) (pediatric): Secondary | ICD-10-CM | POA: Diagnosis not present

## 2016-02-20 NOTE — Patient Instructions (Signed)

## 2016-02-20 NOTE — Progress Notes (Signed)
Cardiology Office Note    Date:  02/20/2016   ID:  EIBHLIN GORNIK, DOB [birth year], MRN [id number]  PCP:  Javier Docker, MD  Cardiologist:  Fransico Him, MD   Chief Complaint  Patient presents with  . Sleep Apnea  . Hypertension    History of Present Illness:  Victoria Jimenez is a 66 y.o. female with a history of OSA, HTN and obesity who presents today for followup. She is doing well with her CPAP therapy. She tolerates her nasal pillow mask and feels the pressure is adequate. She does not think that she snores. She feels rested in the am if she has slept ok and has some daytime sleepiness due to not sleeping well with her back pain..  She had some problems recently with her sciatica and could not sleep well so her compliance was down and she also had a bad cold.  Otherwise she uses it nightly.  She tries to walk for exercise but recently has not been able to exercise due to her back pain. She says that she has a lot of problems with dry mouth.     Past Medical History:  Diagnosis Date  . Allergic rhinitis   . Coronary artery disease    Treated by Angioplasty  . Diabetes mellitus without complication ([place])    Type II  . GERD (gastroesophageal reflux disease)   . Herpes simplex   . Hyperlipidemia   . Hypertension   . OSA (obstructive sleep apnea)    on CPAP  . Sleep apnea    wears cpap nightly    Past Surgical History:  Procedure Laterality Date  . ABDOMINAL HYSTERECTOMY    . ANGIOPLASTY    . CARDIAC CATHETERIZATION    . TUBAL LIGATION      Current Medications: Outpatient Medications Prior to Visit  Medication Sig Dispense Refill  . ACCU-CHEK AVIVA PLUS test strip As directed  11  . acetaminophen (TYLENOL) 325 MG tablet Take 650 mg by mouth every 6 (six) hours as needed.    Marland Kitchen amLODipine-atorvastatin (CADUET) 10-40 MG per tablet Take 1 tablet by mouth daily.    Marland Kitchen aspirin EC 81 MG tablet Take 81 mg by mouth daily.    . BD PEN NEEDLE NANO U/F 32G X  4 MM MISC See admin instructions. As directed  3  . bisacodyl (DULCOLAX) 5 MG EC tablet Take 1 tablet (5 mg total) by mouth daily as needed for moderate constipation. 30 tablet 0  . HUMULIN N KWIKPEN 100 UNIT/ML Kiwkpen Inject 30 Units into the skin 2 (two) times daily.  3  . insulin aspart protamine-insulin aspart (NOVOLOG 70/30) (70-30) 100 UNIT/ML injection Inject 20 Units into the skin 2 (two) times daily with a meal.     . INVOKAMET 150-500 MG TABS Take 1 tablet by mouth 2 (two) times daily.  5  . LEVEMIR FLEXTOUCH 100 UNIT/ML Pen Inject 5 Units into the skin at bedtime.   5  . meloxicam (MOBIC) 15 MG tablet Take 15 mg by mouth daily.  1  . pantoprazole (PROTONIX) 40 MG tablet Take 40 mg by mouth daily.    Marland Kitchen PATADAY 0.2 % SOLN Place 2 drops into both eyes 2 (two) times daily.  2  . phentermine (ADIPEX-P) 37.5 MG tablet Take 37.5 mg by mouth daily.  0  . polyethylene glycol (MIRALAX / GLYCOLAX) packet Take 17 g by mouth daily as needed for mild constipation. 14 each 0  . POTASSIUM PO  Take 1-2 tablets by mouth daily as needed (for cramping (legs & feet)).    Marland Kitchen ULTICARE SHORT PEN NEEDLES 31G X 8 MM MISC As directed  2  . clotrimazole (GYNE-LOTRIMIN) 1 % vaginal cream Place 1 Applicatorful vaginally at bedtime. Use through 09/09/2015. 45 g 0  . levofloxacin (LEVAQUIN) 750 MG tablet Take 1 tablet (750 mg total) by mouth daily. Start 09/06/2015 6 tablet 0   No facility-administered medications prior to visit.      Allergies:   Patient has no known allergies.   Social History   Social History  . Marital status: Married    Spouse name: N/A  . Number of children: N/A  . Years of education: N/A   Social History Main Topics  . Smoking status: Never Smoker  . Smokeless tobacco: Never Used  . Alcohol use 0.0 oz/week     Comment: rare  . Drug use: No  . Sexual activity: Not Asked   Other Topics Concern  . None   Social History Narrative  . None     Family History:  The patient's  family history includes Coronary artery disease in her father; Diabetes in her brother, maternal grandmother, and mother; Hypertension in her sister; Lung cancer in her mother.   ROS:   Please see the history of present illness.    ROS All other systems reviewed and are negative.  No flowsheet data found.     PHYSICAL EXAM:   VS:  BP 108/60 (BP Location: Right Arm, Patient Position: Sitting, Cuff Size: Large)   Pulse 88   Ht 5\' 5"  (1.651 m)   Wt 215 lb 12.8 oz (97.9 kg)   BMI 35.91 kg/m    GEN: Well nourished, well developed, in no acute distress  HEENT: normal  Neck: no JVD, carotid bruits, or masses Cardiac: RRR; no murmurs, rubs, or gallops,no edema.  Intact distal pulses bilaterally.  Respiratory:  clear to auscultation bilaterally, normal work of breathing GI: soft, nontender, nondistended, + BS MS: no deformity or atrophy  Skin: warm and dry, no rash Neuro:  Alert and Oriented x 3, Strength and sensation are intact Psych: euthymic mood, full affect  Wt Readings from Last 3 Encounters:  02/20/16 215 lb 12.8 oz (97.9 kg)  09/05/15 228 lb 3.2 oz (103.5 kg)  01/01/15 250 lb (113.4 kg)      Studies/Labs Reviewed:   EKG:  EKG is not ordered today.    Recent Labs: 09/01/2015: ALT 21 09/03/2015: BUN 15; Creatinine, Ser 0.97; Hemoglobin 12.1; Platelets 236; Potassium 4.5; Sodium 140   Lipid Panel No results found for: CHOL, TRIG, HDL, CHOLHDL, VLDL, LDLCALC, LDLDIRECT  Additional studies/ records that were reviewed today include:  CPAP download    ASSESSMENT:    1. Obstructive sleep apnea   2. Essential hypertension   3. Obesity (BMI 30-39.9)      PLAN:  In order of problems listed above:  OSA - the patient is tolerating PAP therapy well without any problems. The PAP download was reviewed today and showed an AHI of 0.6/hr on 10 cm H2O with 66% compliance in using more than 4 hours nightly.  The patient has been using and benefiting from CPAP use and will  continue to benefit from therapy. I encouraged her to be more compliant with her CPAP.  She was not using it for a while due to severe back pain and a bad cold but is now trying to be more compliant. I encouraged her to  use the chin strap to help with her dry mouth.  I also encouraged her to try the Biotene mouth wash. HTN - BP controlled on current meds.  Continue amlodipine/diuretic. Obesity - Her exercise has been limited recently due to back pain.      Medication Adjustments/Labs and Tests Ordered: Current medicines are reviewed at length with the patient today.  Concerns regarding medicines are outlined above.  Medication changes, Labs and Tests ordered today are listed in the Patient Instructions below.  There are no Patient Instructions on file for this visit.   Signed, Fransico Him, MD  02/20/2016 10:09 AM    [address], [place], [place]  [address] Phone: [phone]; Fax: [phone]

## 2016-03-11 ENCOUNTER — Ambulatory Visit
Admission: RE | Admit: 2016-03-11 | Discharge: 2016-03-11 | Disposition: A | Discharge status: Home / Self Care | Payer: PRIVATE HEALTH INSURANCE | Source: Ambulatory Visit | Attending: Specialist | Admitting: Specialist

## 2016-03-11 DIAGNOSIS — Z1231 Encounter for screening mammogram for malignant neoplasm of breast: Secondary | ICD-10-CM

## 2016-04-07 ENCOUNTER — Other Ambulatory Visit: Payer: Self-pay | Admitting: Pain Medicine

## 2016-04-09 ENCOUNTER — Other Ambulatory Visit: Payer: Self-pay | Admitting: Pain Medicine

## 2016-04-09 DIAGNOSIS — M47816 Spondylosis without myelopathy or radiculopathy, lumbar region: Secondary | ICD-10-CM

## 2016-04-09 DIAGNOSIS — M48061 Spinal stenosis, lumbar region without neurogenic claudication: Secondary | ICD-10-CM

## 2016-04-16 ENCOUNTER — Ambulatory Visit (HOSPITAL_COMMUNITY): Admission: RE | Admit: 2016-04-16 | Payer: Medicaid Other | Source: Ambulatory Visit

## 2016-04-24 ENCOUNTER — Ambulatory Visit (HOSPITAL_COMMUNITY): Admission: RE | Admit: 2016-04-24 | Payer: Medicaid Other | Source: Ambulatory Visit

## 2016-05-01 ENCOUNTER — Ambulatory Visit (HOSPITAL_COMMUNITY): Payer: Medicaid Other

## 2016-11-25 ENCOUNTER — Encounter: Payer: Self-pay | Admitting: Specialist

## 2017-02-17 ENCOUNTER — Other Ambulatory Visit: Payer: Self-pay | Admitting: Specialist

## 2017-02-17 DIAGNOSIS — Z1231 Encounter for screening mammogram for malignant neoplasm of breast: Secondary | ICD-10-CM

## 2017-03-12 ENCOUNTER — Ambulatory Visit: Payer: PRIVATE HEALTH INSURANCE

## 2017-03-18 ENCOUNTER — Ambulatory Visit: Payer: PRIVATE HEALTH INSURANCE

## 2017-05-07 ENCOUNTER — Ambulatory Visit
Admission: RE | Admit: 2017-05-07 | Discharge: 2017-05-07 | Disposition: A | Discharge status: Home / Self Care | Payer: PRIVATE HEALTH INSURANCE | Source: Ambulatory Visit | Attending: Specialist | Admitting: Specialist

## 2017-05-07 DIAGNOSIS — Z1231 Encounter for screening mammogram for malignant neoplasm of breast: Secondary | ICD-10-CM

## 2017-12-23 ENCOUNTER — Telehealth: Payer: Self-pay | Admitting: *Deleted

## 2017-12-23 NOTE — Telephone Encounter (Signed)
Return call: Caren Griffins at [facility] called asking for the settings on patients cpap machine because her primary doctor Buzzy Han will be following the patients sleep therapy going forward. Patient has not seen doctor Turner since 02/20/16.

## 2018-01-30 ENCOUNTER — Encounter: Payer: Self-pay | Admitting: Gastroenterology

## 2018-03-31 ENCOUNTER — Other Ambulatory Visit: Payer: Self-pay | Admitting: Family Medicine

## 2018-03-31 DIAGNOSIS — Z1231 Encounter for screening mammogram for malignant neoplasm of breast: Secondary | ICD-10-CM

## 2018-05-09 ENCOUNTER — Inpatient Hospital Stay: Admission: RE | Admit: 2018-05-09 | Payer: PRIVATE HEALTH INSURANCE | Source: Ambulatory Visit

## 2018-06-13 ENCOUNTER — Ambulatory Visit: Payer: PRIVATE HEALTH INSURANCE

## 2018-07-28 ENCOUNTER — Ambulatory Visit: Payer: PRIVATE HEALTH INSURANCE

## 2018-08-28 ENCOUNTER — Telehealth: Payer: Self-pay | Admitting: *Deleted

## 2018-08-28 NOTE — Telephone Encounter (Signed)
New PCP to follow patients sleep therapy but if she needs to patient will call back to reschedule with our office.

## 2018-08-29 ENCOUNTER — Ambulatory Visit: Payer: PRIVATE HEALTH INSURANCE | Admitting: Cardiology

## 2018-11-14 ENCOUNTER — Other Ambulatory Visit: Payer: Self-pay

## 2018-11-14 ENCOUNTER — Encounter: Payer: PRIVATE HEALTH INSURANCE | Admitting: Cardiology

## 2018-11-14 NOTE — Progress Notes (Signed)
This encounter was created in error - please disregard.

## 2018-11-28 ENCOUNTER — Other Ambulatory Visit: Payer: Self-pay

## 2018-11-28 ENCOUNTER — Ambulatory Visit
Admission: RE | Admit: 2018-11-28 | Discharge: 2018-11-28 | Disposition: A | Discharge status: Home / Self Care | Payer: Medicare Other | Source: Ambulatory Visit | Attending: Family Medicine | Admitting: Family Medicine

## 2018-11-28 DIAGNOSIS — Z1231 Encounter for screening mammogram for malignant neoplasm of breast: Secondary | ICD-10-CM

## 2018-12-20 ENCOUNTER — Encounter: Payer: Self-pay | Admitting: Pulmonary Disease

## 2018-12-20 ENCOUNTER — Other Ambulatory Visit: Payer: Self-pay

## 2018-12-20 ENCOUNTER — Ambulatory Visit (INDEPENDENT_AMBULATORY_CARE_PROVIDER_SITE_OTHER): Payer: Medicare Other | Admitting: Pulmonary Disease

## 2018-12-20 VITALS — BP 142/62 | HR 76 | Ht 66.0 in | Wt 226.4 lb

## 2018-12-20 DIAGNOSIS — G4733 Obstructive sleep apnea (adult) (pediatric): Secondary | ICD-10-CM

## 2018-12-20 NOTE — Patient Instructions (Signed)
History of obstructive sleep apnea  We will get a home sleep study on you We will update you with findings on the study  for multiple awakenings -Melatonin which is over-the-counter 3 to 5 mg, 30 minutes to 1 hour before bedtime may help  I will see you back in about 2 to 3 months Sleep Apnea Sleep apnea is a condition in which breathing pauses or becomes shallow during sleep. Episodes of sleep apnea usually last 10 seconds or longer, and they may occur as many as 20 times an hour. Sleep apnea disrupts your sleep and keeps your body from getting the rest that it needs. This condition can increase your risk of certain health problems, including:  Heart attack.  Stroke.  Obesity.  Diabetes.  Heart failure.  Irregular heartbeat. What are the causes? There are three kinds of sleep apnea:  Obstructive sleep apnea. This kind is caused by a blocked or collapsed airway.  Central sleep apnea. This kind happens when the part of the brain that controls breathing does not send the correct signals to the muscles that control breathing.  Mixed sleep apnea. This is a combination of obstructive and central sleep apnea. The most common cause of this condition is a collapsed or blocked airway. An airway can collapse or become blocked if:  Your throat muscles are abnormally relaxed.  Your tongue and tonsils are larger than normal.  You are overweight.  Your airway is smaller than normal. What increases the risk? You are more likely to develop this condition if you:  Are overweight.  Smoke.  Have a smaller than normal airway.  Are elderly.  Are female.  Drink alcohol.  Take sedatives or tranquilizers.  Have a family history of sleep apnea. What are the signs or symptoms? Symptoms of this condition include:  Trouble staying asleep.  Daytime sleepiness and tiredness.  Irritability.  Loud snoring.  Morning headaches.  Trouble concentrating.  Forgetfulness.  Decreased  interest in sex.  Unexplained sleepiness.  Mood swings.  Personality changes.  Feelings of depression.  Waking up often during the night to urinate.  Dry mouth.  Sore throat. How is this diagnosed? This condition may be diagnosed with:  A medical history.  A physical exam.  A series of tests that are done while you are sleeping (sleep study). These tests are usually done in a sleep lab, but they may also be done at home. How is this treated? Treatment for this condition aims to restore normal breathing and to ease symptoms during sleep. It may involve managing health issues that can affect breathing, such as high blood pressure or obesity. Treatment may include:  Sleeping on your side.  Using a decongestant if you have nasal congestion.  Avoiding the use of depressants, including alcohol, sedatives, and narcotics.  Losing weight if you are overweight.  Making changes to your diet.  Quitting smoking.  Using a device to open your airway while you sleep, such as: ? An oral appliance. This is a custom-made mouthpiece that shifts your lower jaw forward. ? A continuous positive airway pressure (CPAP) device. This device blows air through a mask when you breathe out (exhale). ? A nasal expiratory positive airway pressure (EPAP) device. This device has valves that you put into each nostril. ? A bi-level positive airway pressure (BPAP) device. This device blows air through a mask when you breathe in (inhale) and breathe out (exhale).  Having surgery if other treatments do not work. During surgery, excess tissue is  removed to create a wider airway. It is important to get treatment for sleep apnea. Without treatment, this condition can lead to:  High blood pressure.  Coronary artery disease.  In men, an inability to achieve or maintain an erection (impotence).  Reduced thinking abilities. Follow these instructions at home: Lifestyle  Make any lifestyle changes that your  health care provider recommends.  Eat a healthy, well-balanced diet.  Take steps to lose weight if you are overweight.  Avoid using depressants, including alcohol, sedatives, and narcotics.  Do not use any products that contain nicotine or tobacco, such as cigarettes, e-cigarettes, and chewing tobacco. If you need help quitting, ask your health care provider. General instructions  Take over-the-counter and prescription medicines only as told by your health care provider.  If you were given a device to open your airway while you sleep, use it only as told by your health care provider.  If you are having surgery, make sure to tell your health care provider you have sleep apnea. You may need to bring your device with you.  Keep all follow-up visits as told by your health care provider. This is important. Contact a health care provider if:  The device that you received to open your airway during sleep is uncomfortable or does not seem to be working.  Your symptoms do not improve.  Your symptoms get worse. Get help right away if:  You develop: ? Chest pain. ? Shortness of breath. ? Discomfort in your back, arms, or stomach.  You have: ? Trouble speaking. ? Weakness on one side of your body. ? Drooping in your face. These symptoms may represent a serious problem that is an emergency. Do not wait to see if the symptoms will go away. Get medical help right away. Call your local emergency services (911 in the [place] Do not drive yourself to the hospital. Summary  Sleep apnea is a condition in which breathing pauses or becomes shallow during sleep.  The most common cause is a collapsed or blocked airway.  The goal of treatment is to restore normal breathing and to ease symptoms during sleep. This information is not intended to replace advice given to you by your health care provider. Make sure you discuss any questions you have with your health care provider. Document Released:  01/16/2002 Document Revised: 11/12/2017 Document Reviewed: 09/21/2017 Elsevier Patient Education  2020 [employer].

## 2018-12-20 NOTE — Progress Notes (Signed)
Subjective:    Patient ID: Victoria Jimenez, female    DOB: 26-Nov-1950, 68 y.o.   MRN: [id number]  Patient being evaluated for obstructive sleep apnea  She was diagnosed with obstructive sleep apnea about 10 years ago She did use CPAP for many years  Has not been using CPAP for the last 3 years  When she started having symptoms again she decided to initiate CPAP but was concerned about the possibility of giving herself an infection or not setting up the CPAP well  Admits to snoring Witnessed apneas  OSA was diagnosed as severe 10 years ago  Usually tries to go to bed between 9 and 10 PM Takes about 15 minutes to fall asleep Wakes up 3-4 times during the night Final awakening time about 7 AM  Weight gain of about 10 pounds in the last couple years  She does have hypertension, diabetes, cholesterol    Past Medical History:  Diagnosis Date  . Allergic rhinitis   . Coronary artery disease    Treated by Angioplasty  . Diabetes mellitus without complication ([place])    Type II  . GERD (gastroesophageal reflux disease)   . Herpes simplex   . Hyperlipidemia   . Hypertension   . OSA (obstructive sleep apnea)    on CPAP  . Sleep apnea    wears cpap nightly   Social History   Socioeconomic History  . Marital status: Married    Spouse name: Not on file  . Number of children: Not on file  . Years of education: Not on file  . Highest education level: Not on file  Occupational History  . Not on file  Social Needs  . Financial resource strain: Not on file  . Food insecurity    Worry: Not on file    Inability: Not on file  . Transportation needs    Medical: Not on file    Non-medical: Not on file  Tobacco Use  . Smoking status: Never Smoker  . Smokeless tobacco: Never Used  Substance and Sexual Activity  . Alcohol use: Yes    Alcohol/week: 0.0 standard drinks    Comment: rare  . Drug use: No  . Sexual activity: Not on file  Lifestyle  . Physical activity   Days per week: Not on file    Minutes per session: Not on file  . Stress: Not on file  Relationships  . Social Herbalist on phone: Not on file    Gets together: Not on file    Attends religious service: Not on file    Active member of club or organization: Not on file    Attends meetings of clubs or organizations: Not on file    Relationship status: Not on file  . Intimate partner violence    Fear of current or ex partner: Not on file    Emotionally abused: Not on file    Physically abused: Not on file    Forced sexual activity: Not on file  Other Topics Concern  . Not on file  Social History Narrative  . Not on file   Family History  Problem Relation Age of Onset  . Coronary artery disease Father   . Lung cancer Mother   . Diabetes Mother   . Hypertension Sister   . Diabetes Brother   . Diabetes Maternal Grandmother   . Colon cancer Neg Hx    Review of Systems  Constitutional: Negative for fever and unexpected weight  change.  HENT: Negative for congestion, dental problem, ear pain, nosebleeds, postnasal drip, rhinorrhea, sinus pressure, sneezing, sore throat and trouble swallowing.   Eyes: Negative for redness and itching.  Respiratory: Positive for shortness of breath. Negative for cough, chest tightness and wheezing.   Cardiovascular: Negative for palpitations and leg swelling.  Gastrointestinal: Negative for nausea and vomiting.  Genitourinary: Negative for dysuria.  Musculoskeletal: Negative for joint swelling.  Skin: Negative for rash.  Allergic/Immunologic: Negative.  Negative for environmental allergies, food allergies and immunocompromised state.  Neurological: Negative for headaches.  Hematological: Does not bruise/bleed easily.  Psychiatric/Behavioral: Negative for dysphoric mood. The patient is nervous/anxious.       Objective:   Physical Exam Constitutional:      Appearance: Normal appearance.  HENT:     Head: Normocephalic.     Nose: Nose  normal.     Mouth/Throat:     Mouth: Mucous membranes are moist.     Comments: Mallampati 3, crowded oropharynx Eyes:     Pupils: Pupils are equal, round, and reactive to light.  Neck:     Musculoskeletal: Normal range of motion. No neck rigidity.  Cardiovascular:     Rate and Rhythm: Normal rate and regular rhythm.     Pulses: Normal pulses.     Heart sounds: Normal heart sounds. No murmur. No friction rub.  Pulmonary:     Effort: Pulmonary effort is normal. No respiratory distress.     Breath sounds: Normal breath sounds. No stridor. No wheezing or rhonchi.  Musculoskeletal: Normal range of motion.        General: No swelling or tenderness.  Skin:    General: Skin is warm and dry.  Neurological:     General: No focal deficit present.     Mental Status: She is alert.     Cranial Nerves: No cranial nerve deficit.  Psychiatric:        Mood and Affect: Mood normal.        Behavior: Behavior normal.    Vitals:   12/20/18 1448  BP: (!) 142/62  Pulse: 76  SpO2: 99%   Results of the Epworth flowsheet 12/20/2018  Sitting and reading 1  Watching TV 0  Sitting, inactive in a public place (e.g. a theatre or a meeting) 1  As a passenger in a car for an hour without a break 0  Lying down to rest in the afternoon when circumstances permit 1  Sitting and talking to someone 0  Sitting quietly after a lunch without alcohol 1  In a car, while stopped for a few minutes in traffic 0  Total score 4   Previous study result is not available today       Assessment & Plan:  .  High probability of significant obstructive sleep apnea - Previous history - significant daytime symptoms - Nonrestorative sleep  Comorbidities include hypertension, diabetes  Pathophysiology of sleep disordered breathing discussed with the patient  Treatment options for sleep disordered breathing discussed with the patient  Plan: We will schedule patient for home sleep study  Treatment options will  include CPAP therapy  Encouraged to continue weight loss efforts  Patient may want to continue using the CPAP she already owns, she will try to troubleshoot the machine  We will try and assist her with patient assistance programs  I will see her back in the office in about 2- 3 months

## 2019-01-11 ENCOUNTER — Telehealth: Payer: Self-pay | Admitting: Pulmonary Disease

## 2019-01-11 NOTE — Telephone Encounter (Signed)
Spoke to pt she is aware we are backed up with hST due to covid  She will get as call as soon as we can get her in [place]

## 2019-02-01 ENCOUNTER — Other Ambulatory Visit: Payer: Self-pay

## 2019-02-01 ENCOUNTER — Ambulatory Visit: Payer: Medicare Other

## 2019-02-01 DIAGNOSIS — G4733 Obstructive sleep apnea (adult) (pediatric): Secondary | ICD-10-CM | POA: Diagnosis not present

## 2019-02-07 DIAGNOSIS — G4733 Obstructive sleep apnea (adult) (pediatric): Secondary | ICD-10-CM | POA: Diagnosis not present

## 2019-02-13 ENCOUNTER — Telehealth: Payer: Self-pay

## 2019-02-13 DIAGNOSIS — G4733 Obstructive sleep apnea (adult) (pediatric): Secondary | ICD-10-CM

## 2019-02-13 NOTE — Telephone Encounter (Signed)
I called and spoke with the patient in regards to her HST results and advised her that I will be placing an order for an auto CPAP per Dr. Judson Roch recommendations. She verbalized understanding.

## 2019-04-11 ENCOUNTER — Ambulatory Visit: Payer: Self-pay

## 2019-04-13 ENCOUNTER — Ambulatory Visit: Payer: Medicare Other | Attending: Internal Medicine

## 2019-04-13 DIAGNOSIS — Z23 Encounter for immunization: Secondary | ICD-10-CM | POA: Insufficient documentation

## 2019-04-13 NOTE — Progress Notes (Signed)
   Covid-19 Vaccination Clinic  Name:  ERIENNE ALEXY    MRN: [id number] DOB: [birth year]  04/13/2019  Ms. Novotney was observed post Covid-19 immunization for 15 minutes without incident. She was provided with Vaccine Information Sheet and instruction to access the V-Safe system.   Ms. Hochman was instructed to call 911 with any severe reactions post vaccine: Marland Kitchen Difficulty breathing  . Swelling of face and throat  . A fast heartbeat  . A bad rash all over body  . Dizziness and weakness   Immunizations Administered    Name Date Dose VIS Date Route   Pfizer COVID-19 Vaccine 04/13/2019  2:48 PM 0.3 mL 01/20/2019 Intramuscular   Manufacturer: [place]   Lot: [id number]   [place]: [id number]

## 2019-05-10 ENCOUNTER — Ambulatory Visit: Payer: Medicare Other | Attending: Internal Medicine

## 2019-05-10 DIAGNOSIS — Z23 Encounter for immunization: Secondary | ICD-10-CM

## 2019-05-10 NOTE — Progress Notes (Signed)
   Covid-19 Vaccination Clinic  Name:  Victoria Jimenez    MRN: [id number] DOB: 1950-05-18  05/10/2019  [name] was observed post Covid-19 immunization for 15 minutes without incident. She was provided with Vaccine Information Sheet and instruction to access the V-Safe system.   [name] was instructed to call 911 with any severe reactions post vaccine: Marland Kitchen Difficulty breathing  . Swelling of face and throat  . A fast heartbeat  . A bad rash all over body  . Dizziness and weakness   Immunizations Administered    Name Date Dose VIS Date Route   Pfizer COVID-19 Vaccine 05/10/2019 10:00 AM 0.3 mL 01/20/2019 Intramuscular   Manufacturer: [place]   Lot: [id number]   [place]: [id number]

## 2019-08-08 NOTE — Progress Notes (Signed)
[facility] Note  08/11/2019     CHIEF COMPLAINT Patient presents for Diabetic Eye Exam   HISTORY OF PRESENT ILLNESS: Victoria Jimenez is a 69 y.o. female who presents to the clinic today for:   HPI    Diabetic Eye Exam    Vision is stable.  Associated Symptoms Negative for Flashes, Distortion, Pain, Trauma, Photophobia, Jaw Claudication, Fever, Fatigue, Floaters, Blind Spot, Redness, Glare, Scalp Tenderness, Shoulder/Hip pain and Weight Loss.  Diabetes characteristics include Type 2, taking oral medications and on insulin.  This started 20 years ago.  Blood sugar level is controlled.  Last Blood Glucose 142 (Yesterday am).  Last A1C: Less than 8.  Associated Diagnosis Neuropathy.  I, the attending physician,  performed the HPI with the patient and updated documentation appropriately.          Comments    Patient states referred by Dr. Parke Simmers for diabetic retinal evaluation. Patient reports no difficulty with vision at the present time. No floaters or flashes.        Last edited by Bernarda Caffey, MD on 08/14/2019 11:41 PM. (History)    pt is here on the referral of Dr. Parke Simmers for concern of DME, pt states she is a regular pt of [facility], but this was her first visit with [facility], she states she went for a routine eye exam as she is not having any problems with her vision at this time, pt is on insulin and states her last A1c was around 7  Referring physician: [place], [place], [place],  [place] [address]  HISTORICAL INFORMATION:   Selected notes from the [facility] Referred by Dr. [place] DeMarco for concern of NPDR OU LEE: 06.18.21 (J. DeMarco) [BCVA: OD: 20/20-- OS: 20/25+) Ocular Hx-NPDR OU, DME OS, glaucoma suspect, lattice degeneration OU, HTN ret OU, cataracts OU PMH-diabetes, HTN, HLD    CURRENT MEDICATIONS: Current Outpatient Medications (Ophthalmic Drugs)  Medication Sig  . PATADAY 0.2 % SOLN Place 2  drops into both eyes 2 (two) times daily.   No current facility-administered medications for this visit. (Ophthalmic Drugs)   Current Outpatient Medications (Other)  Medication Sig  . ACCU-CHEK AVIVA PLUS test strip As directed  . acetaminophen (TYLENOL) 325 MG tablet Take 650 mg by mouth every 6 (six) hours as needed.  Marland Kitchen amLODipine-atorvastatin (CADUET) 10-40 MG per tablet Take 1 tablet by mouth daily.  Marland Kitchen aspirin EC 81 MG tablet Take 81 mg by mouth daily.  . BD PEN NEEDLE NANO U/F 32G X 4 MM MISC See admin instructions. As directed  . bisacodyl (DULCOLAX) 5 MG EC tablet Take 1 tablet (5 mg total) by mouth daily as needed for moderate constipation.  . gabapentin (NEURONTIN) 300 MG capsule Take 300 mg by mouth 3 (three) times daily.  . hydrochlorothiazide (HYDRODIURIL) 25 MG tablet Take 25 mg by mouth daily.  . meloxicam (MOBIC) 15 MG tablet Take 15 mg by mouth daily.  . metFORMIN (GLUCOPHAGE) 1000 MG tablet Take 1,000 mg by mouth 2 (two) times daily.  . pantoprazole (PROTONIX) 40 MG tablet Take 40 mg by mouth daily.  . polyethylene glycol (MIRALAX / GLYCOLAX) packet Take 17 g by mouth daily as needed for mild constipation.  Marland Kitchen POTASSIUM PO Take 1-2 tablets by mouth daily as needed (for cramping (legs & feet)).  Marland Kitchen ULTICARE SHORT PEN NEEDLES 31G X 8 MM MISC As directed  . HUMULIN N KWIKPEN 100 UNIT/ML Kiwkpen Inject  36 Units into the skin 2 (two) times daily.  (Patient not taking: Reported on 08/11/2019)   No current facility-administered medications for this visit. (Other)      REVIEW OF SYSTEMS: ROS    Positive for: Endocrine, Eyes   Negative for: Constitutional, Gastrointestinal, Neurological, Skin, Genitourinary, Musculoskeletal, HENT, Cardiovascular, Respiratory, Psychiatric, Allergic/Imm, Heme/Lymph   Last edited by Roselee Nova D, COT on 08/11/2019  8:31 AM. (History)       ALLERGIES No Known Allergies  PAST MEDICAL HISTORY Past Medical History:  Diagnosis Date  . Allergic  rhinitis   . Coronary artery disease    Treated by Angioplasty  . Diabetes mellitus without complication ([place])    Type II  . GERD (gastroesophageal reflux disease)   . Herpes simplex   . Hyperlipidemia   . Hypertension   . OSA (obstructive sleep apnea)    on CPAP  . Sleep apnea    wears cpap nightly   Past Surgical History:  Procedure Laterality Date  . ABDOMINAL HYSTERECTOMY    . ANGIOPLASTY    . CARDIAC CATHETERIZATION    . TUBAL LIGATION      FAMILY HISTORY Family History  Problem Relation Age of Onset  . Coronary artery disease Father   . Lung cancer Mother   . Diabetes Mother   . Hypertension Sister   . Diabetes Brother   . Diabetes Maternal Grandmother   . Colon cancer Neg Hx     SOCIAL HISTORY Social History   Tobacco Use  . Smoking status: Never Smoker  . Smokeless tobacco: Never Used  Substance Use Topics  . Alcohol use: Yes    Alcohol/week: 0.0 standard drinks    Comment: rare  . Drug use: No         OPHTHALMIC EXAM:  Base Eye Exam    Visual Acuity (Snellen - Linear)      Right Left   Dist cc 20/25 -1 20/20 -1   Dist ph cc 20/25 20/20       Tonometry (Tonopen, 8:44 AM)      Right Left   Pressure 16 16       Pupils      Dark Light Shape React APD   Right 3 2 Round Brisk None   Left 3 2 Round Brisk None       Visual Fields (Counting fingers)      Left Right    Full Full       Extraocular Movement      Right Left    Full, Ortho Full, Ortho       Dilation    Both eyes: 1.0% Mydriacyl, 2.5% Phenylephrine @ 8:44 AM        Slit Lamp and Fundus Exam    Slit Lamp Exam      Right Left   Lids/Lashes Dermatochalasis - upper lid Dermatochalasis - upper lid   Conjunctiva/Sclera Mild Melanosis Mild Melanosis   Cornea Trace Punctate epithelial erosions Trace Punctate epithelial erosions   Anterior Chamber deep, narrow temporal angle deep, narrow temporal angle   Iris Round and dilated, No NVI Round and dilated, No NVI   Lens 2+  Nuclear sclerosis, 2+ Cortical cataract 2+ Nuclear sclerosis, 2+ Cortical cataract   Vitreous Vitreous syneresis Vitreous syneresis       Fundus Exam      Right Left   Disc Pink and Sharp Pink and Sharp   C/D Ratio 0.5 0.5   Macula Flat, Good foveal reflex, scattered  Microaneurysms blunted foveal reflex, +cystic changes/edema nasal fovea, scattered Microaneurysms   Vessels Vascular attenuation, tortuousity Vascular attenuation, tortuousity   Periphery Attached, +IRH/MA Attached, No heme         Refraction    Wearing Rx      Sphere Cylinder Axis Add   Right +1.75 +0.50 150 +2.25   Left +0.75 +1.00 032 +2.25       Manifest Refraction      Sphere Cylinder Axis Dist VA   Right +1.75 +1.00 135 20/25-2   Left +1.00 +1.00 035 20/20-2          IMAGING AND PROCEDURES  Imaging and Procedures for 08/11/2019  OCT, Retina - OU - Both Eyes       Right Eye Quality was good. Central Foveal Thickness: 251. Progression has no prior data. Findings include normal foveal contour, no IRF, no SRF, vitreomacular adhesion .   Left Eye Quality was good. Central Foveal Thickness: 310. Progression has no prior data. Findings include abnormal foveal contour, intraretinal fluid, no SRF (+DME nasal fovea).   Notes *Images captured and stored on drive  Diagnosis / Impression:  OD: NFP, no IRF/SRF OS: +DME nasal fovea  Clinical management:  See below  Abbreviations: NFP - Normal foveal profile. CME - cystoid macular edema. PED - pigment epithelial detachment. IRF - intraretinal fluid. SRF - subretinal fluid. EZ - ellipsoid zone. ERM - epiretinal membrane. ORA - outer retinal atrophy. ORT - outer retinal tubulation. SRHM - subretinal hyper-reflective material. IRHM - intraretinal hyper-reflective material                 ASSESSMENT/PLAN:    ICD-10-CM   1. Moderate nonproliferative diabetic retinopathy of right eye without macular edema associated with type 2 diabetes mellitus ([place])   E93.8101   2. Moderate nonproliferative diabetic retinopathy of left eye with macular edema associated with type 2 diabetes mellitus ([place])  B51.0258   3. Retinal edema  H35.81 OCT, Retina - OU - Both Eyes  4. Essential hypertension  I10   5. Hypertensive retinopathy of both eyes  H35.033   6. Nuclear sclerosis of both eyes  H25.13     1-3. Moderate Non-proliferative diabetic retinopathy, OU  - OD: without DME  - OS: +DME - The incidence, risk factors for progression, natural history and treatment options for diabetic retinopathy were discussed with patient.   - The need for close monitoring of blood glucose, blood pressure, and serum lipids, avoiding cigarette or any type of tobacco, and the need for long term follow up was also discussed with patient. - exam with scattered MA OU - OCT shows +DME nasal fovea OS - The natural history, pathology, and characteristics of diabetic macular edema discussed with patient.  A generalized discussion of the major clinical trials concerning treatment of diabetic macular edema (ETDRS, DCT, SCORE, RISE / RIDE, and ongoing DRCR net studies) was completed.  This discussion included mention of the various approaches to treating diabetic macular edema (observation, laser photocoagulation, anti-VEGF injections with lucentis / Avastin / Eylea, steroid injections with Kenalog / Ozurdex, and intraocular surgery with vitrectomy).  The goal hemoglobin A1C of 6-7 was discussed, as well as importance of smoking cessation and hypertension control.  Need for ongoing treatment and monitoring were specifically discussed with reference to chronic nature of diabetic macular edema. - recommend holding off on treatment today due to excellent vision - f/u in 4-6 wks -- DFE/OCT, possible injection  4,5. Hypertensive retinopathy OU - discussed importance  of tight BP control - monitor   6. Mixed Cataract OU - The symptoms of cataract, surgical options, and treatments and risks  were discussed with patient. - discussed diagnosis and progression   Ophthalmic Meds Ordered this visit:  No orders of the defined types were placed in this encounter.      Return for f/u 4-6 weeks, NPDR OU, DFE, OCT.  There are no Patient Instructions on file for this visit.   Explained the diagnoses, plan, and follow up with the patient and they expressed understanding.  Patient expressed understanding of the importance of proper follow up care.   This document serves as a record of services personally performed by Gardiner Sleeper, MD, PhD. It was created on their behalf by San Jetty. Owens Shark, COT, an ophthalmic technician. The creation of this record is the provider's dictation and/or activities during the visit.    Electronically signed by: San Jetty. Owens Shark, [place] 06.29.2021 11:45 PM  Gardiner Sleeper, M.D., Ph.D. Diseases & Surgery of the Retina and Vitreous [facility]  I have reviewed the above documentation for accuracy and completeness, and I agree with the above. Gardiner Sleeper, M.D., Ph.D. 08/14/19 11:45 PM   Abbreviations: M myopia (nearsighted); A astigmatism; H hyperopia (farsighted); P presbyopia; Mrx spectacle prescription;  CTL contact lenses; OD right eye; OS left eye; OU both eyes  XT exotropia; ET esotropia; PEK punctate epithelial keratitis; PEE punctate epithelial erosions; DES dry eye syndrome; MGD meibomian gland dysfunction; ATs artificial tears; PFAT's preservative free artificial tears; [place] nuclear sclerotic cataract; PSC posterior subcapsular cataract; ERM epi-retinal membrane; PVD posterior vitreous detachment; RD retinal detachment; DM diabetes mellitus; DR diabetic retinopathy; NPDR non-proliferative diabetic retinopathy; PDR proliferative diabetic retinopathy; CSME clinically significant macular edema; DME diabetic macular edema; dbh dot blot hemorrhages; CWS cotton wool spot; POAG primary open angle glaucoma; C/D cup-to-disc ratio; HVF  humphrey visual field; GVF goldmann visual field; OCT optical coherence tomography; IOP intraocular pressure; BRVO Branch retinal vein occlusion; CRVO central retinal vein occlusion; CRAO central retinal artery occlusion; BRAO branch retinal artery occlusion; RT retinal tear; SB scleral buckle; PPV pars plana vitrectomy; VH Vitreous hemorrhage; PRP panretinal laser photocoagulation; IVK intravitreal kenalog; VMT vitreomacular traction; MH Macular hole;  NVD neovascularization of the disc; NVE neovascularization elsewhere; AREDS age related eye disease study; ARMD age related macular degeneration; POAG primary open angle glaucoma; EBMD epithelial/anterior basement membrane dystrophy; ACIOL anterior chamber intraocular lens; IOL intraocular lens; PCIOL posterior chamber intraocular lens; Phaco/IOL phacoemulsification with intraocular lens placement; [place] photorefractive keratectomy; LASIK laser assisted in situ keratomileusis; HTN hypertension; DM diabetes mellitus; COPD chronic obstructive pulmonary disease

## 2019-08-11 ENCOUNTER — Ambulatory Visit (INDEPENDENT_AMBULATORY_CARE_PROVIDER_SITE_OTHER): Payer: Medicare Other | Admitting: Ophthalmology

## 2019-08-11 ENCOUNTER — Other Ambulatory Visit: Payer: Self-pay

## 2019-08-11 ENCOUNTER — Encounter (INDEPENDENT_AMBULATORY_CARE_PROVIDER_SITE_OTHER): Payer: Self-pay | Admitting: Ophthalmology

## 2019-08-11 DIAGNOSIS — H35033 Hypertensive retinopathy, bilateral: Secondary | ICD-10-CM

## 2019-08-11 DIAGNOSIS — E113312 Type 2 diabetes mellitus with moderate nonproliferative diabetic retinopathy with macular edema, left eye: Secondary | ICD-10-CM

## 2019-08-11 DIAGNOSIS — E113391 Type 2 diabetes mellitus with moderate nonproliferative diabetic retinopathy without macular edema, right eye: Secondary | ICD-10-CM | POA: Diagnosis not present

## 2019-08-11 DIAGNOSIS — I10 Essential (primary) hypertension: Secondary | ICD-10-CM | POA: Diagnosis not present

## 2019-08-11 DIAGNOSIS — H3581 Retinal edema: Secondary | ICD-10-CM | POA: Diagnosis not present

## 2019-08-11 DIAGNOSIS — H2513 Age-related nuclear cataract, bilateral: Secondary | ICD-10-CM

## 2019-08-14 ENCOUNTER — Encounter (INDEPENDENT_AMBULATORY_CARE_PROVIDER_SITE_OTHER): Payer: Self-pay | Admitting: Ophthalmology

## 2019-09-22 ENCOUNTER — Encounter (INDEPENDENT_AMBULATORY_CARE_PROVIDER_SITE_OTHER): Payer: Medicare Other | Admitting: Ophthalmology

## 2019-09-22 ENCOUNTER — Encounter (INDEPENDENT_AMBULATORY_CARE_PROVIDER_SITE_OTHER): Payer: Self-pay

## 2019-10-30 ENCOUNTER — Other Ambulatory Visit: Payer: Self-pay | Admitting: Family Medicine

## 2019-10-30 DIAGNOSIS — Z1231 Encounter for screening mammogram for malignant neoplasm of breast: Secondary | ICD-10-CM

## 2019-11-29 ENCOUNTER — Ambulatory Visit
Admission: RE | Admit: 2019-11-29 | Discharge: 2019-11-29 | Disposition: A | Discharge status: Home / Self Care | Payer: Medicare Other | Source: Ambulatory Visit | Attending: Family Medicine | Admitting: Family Medicine

## 2019-11-29 ENCOUNTER — Other Ambulatory Visit: Payer: Self-pay

## 2019-11-29 DIAGNOSIS — Z1231 Encounter for screening mammogram for malignant neoplasm of breast: Secondary | ICD-10-CM

## 2019-12-18 ENCOUNTER — Ambulatory Visit: Payer: Medicare Other

## 2019-12-18 ENCOUNTER — Ambulatory Visit: Payer: Medicare Other | Attending: Internal Medicine

## 2019-12-18 DIAGNOSIS — Z23 Encounter for immunization: Secondary | ICD-10-CM

## 2019-12-18 NOTE — Progress Notes (Signed)
   Covid-19 Vaccination Clinic  Name:  JEREMIE ABDELAZIZ    MRN: [id number] DOB: 04-27-1950  12/18/2019  Ms. Meisenheimer was observed post Covid-19 immunization for 15 minutes without incident. She was provided with Vaccine Information Sheet and instruction to access the V-Safe system.   Ms. Gramajo was instructed to call 911 with any severe reactions post vaccine: Marland Kitchen Difficulty breathing  . Swelling of face and throat  . A fast heartbeat  . A bad rash all over body  . Dizziness and weakness

## 2019-12-19 ENCOUNTER — Ambulatory Visit: Payer: Medicare Other

## 2020-05-20 ENCOUNTER — Ambulatory Visit
Admission: RE | Admit: 2020-05-20 | Discharge: 2020-05-20 | Disposition: A | Discharge status: Home / Self Care | Payer: Medicare Other | Source: Ambulatory Visit | Attending: Family Medicine | Admitting: Family Medicine

## 2020-05-20 ENCOUNTER — Other Ambulatory Visit: Payer: Self-pay | Admitting: Family Medicine

## 2020-05-20 ENCOUNTER — Other Ambulatory Visit: Payer: Self-pay

## 2020-05-20 DIAGNOSIS — R52 Pain, unspecified: Secondary | ICD-10-CM

## 2021-09-18 ENCOUNTER — Other Ambulatory Visit: Payer: Self-pay | Admitting: Family Medicine

## 2021-09-18 DIAGNOSIS — R4189 Other symptoms and signs involving cognitive functions and awareness: Secondary | ICD-10-CM

## 2021-09-30 ENCOUNTER — Ambulatory Visit
Admission: RE | Admit: 2021-09-30 | Discharge: 2021-09-30 | Disposition: A | Discharge status: Home / Self Care | Payer: Medicare Other | Source: Ambulatory Visit | Attending: Family Medicine | Admitting: Family Medicine

## 2021-09-30 DIAGNOSIS — R4189 Other symptoms and signs involving cognitive functions and awareness: Secondary | ICD-10-CM

## 2021-11-11 ENCOUNTER — Emergency Department (HOSPITAL_COMMUNITY): Payer: Medicare Other

## 2021-11-11 ENCOUNTER — Encounter (HOSPITAL_COMMUNITY): Payer: Self-pay

## 2021-11-11 ENCOUNTER — Inpatient Hospital Stay: Payer: Self-pay

## 2021-11-11 ENCOUNTER — Inpatient Hospital Stay (HOSPITAL_COMMUNITY)
Admission: EM | Admit: 2021-11-11 | Discharge: 2021-11-18 | DRG: 070 | Disposition: A | Discharge status: Home Health | Payer: Medicare Other | Attending: Internal Medicine | Admitting: Internal Medicine

## 2021-11-11 ENCOUNTER — Inpatient Hospital Stay (HOSPITAL_COMMUNITY): Payer: Medicare Other

## 2021-11-11 DIAGNOSIS — I161 Hypertensive emergency: Secondary | ICD-10-CM

## 2021-11-11 DIAGNOSIS — Z79899 Other long term (current) drug therapy: Secondary | ICD-10-CM | POA: Diagnosis not present

## 2021-11-11 DIAGNOSIS — B009 Herpesviral infection, unspecified: Secondary | ICD-10-CM | POA: Diagnosis present

## 2021-11-11 DIAGNOSIS — J690 Pneumonitis due to inhalation of food and vomit: Secondary | ICD-10-CM | POA: Diagnosis present

## 2021-11-11 DIAGNOSIS — Z7984 Long term (current) use of oral hypoglycemic drugs: Secondary | ICD-10-CM

## 2021-11-11 DIAGNOSIS — J9601 Acute respiratory failure with hypoxia: Secondary | ICD-10-CM | POA: Diagnosis present

## 2021-11-11 DIAGNOSIS — R739 Hyperglycemia, unspecified: Secondary | ICD-10-CM | POA: Diagnosis not present

## 2021-11-11 DIAGNOSIS — I10 Essential (primary) hypertension: Secondary | ICD-10-CM | POA: Diagnosis present

## 2021-11-11 DIAGNOSIS — E876 Hypokalemia: Secondary | ICD-10-CM | POA: Diagnosis present

## 2021-11-11 DIAGNOSIS — Z6833 Body mass index (BMI) 33.0-33.9, adult: Secondary | ICD-10-CM | POA: Diagnosis not present

## 2021-11-11 DIAGNOSIS — E669 Obesity, unspecified: Secondary | ICD-10-CM | POA: Diagnosis present

## 2021-11-11 DIAGNOSIS — R4701 Aphasia: Secondary | ICD-10-CM | POA: Diagnosis not present

## 2021-11-11 DIAGNOSIS — Z794 Long term (current) use of insulin: Secondary | ICD-10-CM

## 2021-11-11 DIAGNOSIS — G9341 Metabolic encephalopathy: Secondary | ICD-10-CM | POA: Diagnosis present

## 2021-11-11 DIAGNOSIS — R4182 Altered mental status, unspecified: Secondary | ICD-10-CM | POA: Diagnosis not present

## 2021-11-11 DIAGNOSIS — Z833 Family history of diabetes mellitus: Secondary | ICD-10-CM | POA: Diagnosis not present

## 2021-11-11 DIAGNOSIS — E1140 Type 2 diabetes mellitus with diabetic neuropathy, unspecified: Secondary | ICD-10-CM | POA: Diagnosis present

## 2021-11-11 DIAGNOSIS — K219 Gastro-esophageal reflux disease without esophagitis: Secondary | ICD-10-CM | POA: Diagnosis present

## 2021-11-11 DIAGNOSIS — D638 Anemia in other chronic diseases classified elsewhere: Secondary | ICD-10-CM | POA: Diagnosis present

## 2021-11-11 DIAGNOSIS — E785 Hyperlipidemia, unspecified: Secondary | ICD-10-CM | POA: Diagnosis present

## 2021-11-11 DIAGNOSIS — R531 Weakness: Secondary | ICD-10-CM

## 2021-11-11 DIAGNOSIS — N179 Acute kidney failure, unspecified: Secondary | ICD-10-CM | POA: Diagnosis present

## 2021-11-11 DIAGNOSIS — Z7982 Long term (current) use of aspirin: Secondary | ICD-10-CM | POA: Diagnosis not present

## 2021-11-11 DIAGNOSIS — J9600 Acute respiratory failure, unspecified whether with hypoxia or hypercapnia: Secondary | ICD-10-CM | POA: Insufficient documentation

## 2021-11-11 DIAGNOSIS — Z8249 Family history of ischemic heart disease and other diseases of the circulatory system: Secondary | ICD-10-CM | POA: Diagnosis not present

## 2021-11-11 DIAGNOSIS — I2510 Atherosclerotic heart disease of native coronary artery without angina pectoris: Secondary | ICD-10-CM | POA: Diagnosis present

## 2021-11-11 DIAGNOSIS — G4733 Obstructive sleep apnea (adult) (pediatric): Secondary | ICD-10-CM | POA: Diagnosis present

## 2021-11-11 DIAGNOSIS — Z791 Long term (current) use of non-steroidal anti-inflammatories (NSAID): Secondary | ICD-10-CM | POA: Diagnosis not present

## 2021-11-11 DIAGNOSIS — E1110 Type 2 diabetes mellitus with ketoacidosis without coma: Secondary | ICD-10-CM | POA: Diagnosis present

## 2021-11-11 LAB — DIFFERENTIAL
Abs Immature Granulocytes: 0.04 10*3/uL (ref 0.00–0.07)
Basophils Absolute: 0.1 10*3/uL (ref 0.0–0.1)
Basophils Relative: 1 %
Eosinophils Absolute: 0.2 10*3/uL (ref 0.0–0.5)
Eosinophils Relative: 2 %
Immature Granulocytes: 0 %
Lymphocytes Relative: 21 %
Lymphs Abs: 2 10*3/uL (ref 0.7–4.0)
Monocytes Absolute: 0.4 10*3/uL (ref 0.1–1.0)
Monocytes Relative: 4 %
Neutro Abs: 7.2 10*3/uL (ref 1.7–7.7)
Neutrophils Relative %: 72 %

## 2021-11-11 LAB — MENINGITIS/ENCEPHALITIS PANEL (CSF)

## 2021-11-11 LAB — HEMOGLOBIN A1C
Hgb A1c MFr Bld: 7.4 % — ABNORMAL HIGH (ref 4.8–5.6)
Mean Plasma Glucose: 165.68 mg/dL

## 2021-11-11 LAB — URINALYSIS, ROUTINE W REFLEX MICROSCOPIC
Bilirubin Urine: NEGATIVE
Glucose, UA: >=500 mg/dL — AB
Ketones, ur: NEGATIVE mg/dL
Leukocytes,Ua: NEGATIVE
Nitrite: NEGATIVE
Protein, ur: 100 mg/dL — AB
Specific Gravity, Urine: 1.017 (ref 1.005–1.030)
pH: 8 (ref 5.0–8.0)

## 2021-11-11 LAB — BASIC METABOLIC PANEL
Anion gap: 20 — ABNORMAL HIGH (ref 5–15)
Anion gap: 17 — ABNORMAL HIGH (ref 5–15)
Anion gap: 14 (ref 5–15)
BUN: 19 mg/dL (ref 8–23)
BUN: 21 mg/dL (ref 8–23)
BUN: 26 mg/dL — ABNORMAL HIGH (ref 8–23)
CO2: 22 mmol/L (ref 22–32)
CO2: 20 mmol/L — ABNORMAL LOW (ref 22–32)
CO2: 23 mmol/L (ref 22–32)
Calcium: 10.1 mg/dL (ref 8.9–10.3)
Calcium: 9.1 mg/dL (ref 8.9–10.3)
Calcium: 9.2 mg/dL (ref 8.9–10.3)
Chloride: 95 mmol/L — ABNORMAL LOW (ref 98–111)
Chloride: 100 mmol/L (ref 98–111)
Chloride: 102 mmol/L (ref 98–111)
Creatinine, Ser: 1.06 mg/dL — ABNORMAL HIGH (ref 0.44–1.00)
Creatinine, Ser: 1.68 mg/dL — ABNORMAL HIGH (ref 0.44–1.00)
Creatinine, Ser: 1.79 mg/dL — ABNORMAL HIGH (ref 0.44–1.00)
GFR, Estimated: 57 mL/min — ABNORMAL LOW (ref >60)
GFR, Estimated: 33 mL/min — ABNORMAL LOW (ref >60)
GFR, Estimated: 31 mL/min — ABNORMAL LOW (ref >60)
Glucose, Bld: 470 mg/dL — ABNORMAL HIGH (ref 70–99)
Glucose, Bld: 386 mg/dL — ABNORMAL HIGH (ref 70–99)
Glucose, Bld: 244 mg/dL — ABNORMAL HIGH (ref 70–99)
Potassium: 3.4 mmol/L — ABNORMAL LOW (ref 3.5–5.1)
Potassium: 3.3 mmol/L — ABNORMAL LOW (ref 3.5–5.1)
Potassium: 3.2 mmol/L — ABNORMAL LOW (ref 3.5–5.1)
Sodium: 137 mmol/L (ref 135–145)
Sodium: 137 mmol/L (ref 135–145)
Sodium: 139 mmol/L (ref 135–145)

## 2021-11-11 LAB — GLUCOSE, CAPILLARY
Glucose-Capillary: 546 mg/dL (ref 70–99)
Glucose-Capillary: 502 mg/dL (ref 70–99)
Glucose-Capillary: 374 mg/dL — ABNORMAL HIGH (ref 70–99)
Glucose-Capillary: 349 mg/dL — ABNORMAL HIGH (ref 70–99)
Glucose-Capillary: 248 mg/dL — ABNORMAL HIGH (ref 70–99)
Glucose-Capillary: 227 mg/dL — ABNORMAL HIGH (ref 70–99)
Glucose-Capillary: 246 mg/dL — ABNORMAL HIGH (ref 70–99)
Glucose-Capillary: 220 mg/dL — ABNORMAL HIGH (ref 70–99)
Glucose-Capillary: 228 mg/dL — ABNORMAL HIGH (ref 70–99)
Glucose-Capillary: 215 mg/dL — ABNORMAL HIGH (ref 70–99)

## 2021-11-11 LAB — CBC
HCT: 42 % (ref 36.0–46.0)
HCT: 42 % (ref 36.0–46.0)
Hemoglobin: 13.7 g/dL (ref 12.0–15.0)
Hemoglobin: 14.7 g/dL (ref 12.0–15.0)
MCH: 28.8 pg (ref 26.0–34.0)
MCH: 28.6 pg (ref 26.0–34.0)
MCHC: 32.6 g/dL (ref 30.0–36.0)
MCHC: 35 g/dL (ref 30.0–36.0)
MCV: 88.2 fL (ref 80.0–100.0)
MCV: 81.7 fL (ref 80.0–100.0)
Platelets: 263 10*3/uL (ref 150–400)
Platelets: 292 10*3/uL (ref 150–400)
RBC: 4.76 MIL/uL (ref 3.87–5.11)
RBC: 5.14 MIL/uL — ABNORMAL HIGH (ref 3.87–5.11)
RDW: 12.3 % (ref 11.5–15.5)
RDW: 12.3 % (ref 11.5–15.5)
WBC: 9.9 10*3/uL (ref 4.0–10.5)
WBC: 11 10*3/uL — ABNORMAL HIGH (ref 4.0–10.5)
nRBC: 0 % (ref 0.0–0.2)
nRBC: 0 % (ref 0.0–0.2)

## 2021-11-11 LAB — I-STAT CHEM 8, ED
BUN: 21 mg/dL (ref 8–23)
Calcium, Ion: 1.26 mmol/L (ref 1.15–1.40)
Chloride: 97 mmol/L — ABNORMAL LOW (ref 98–111)
Creatinine, Ser: 1 mg/dL (ref 0.44–1.00)
Glucose, Bld: 441 mg/dL — ABNORMAL HIGH (ref 70–99)
HCT: 41 % (ref 36.0–46.0)
Hemoglobin: 13.9 g/dL (ref 12.0–15.0)
Potassium: 3.8 mmol/L (ref 3.5–5.1)
Sodium: 138 mmol/L (ref 135–145)
TCO2: 27 mmol/L (ref 22–32)

## 2021-11-11 LAB — COMPREHENSIVE METABOLIC PANEL
ALT: 18 U/L (ref 0–44)
AST: 21 U/L (ref 15–41)
Albumin: 4.1 g/dL (ref 3.5–5.0)
Alkaline Phosphatase: 94 U/L (ref 38–126)
Anion gap: 18 — ABNORMAL HIGH (ref 5–15)
BUN: 22 mg/dL (ref 8–23)
CO2: 18 mmol/L — ABNORMAL LOW (ref 22–32)
Calcium: 9.9 mg/dL (ref 8.9–10.3)
Chloride: 100 mmol/L (ref 98–111)
Creatinine, Ser: 1.14 mg/dL — ABNORMAL HIGH (ref 0.44–1.00)
GFR, Estimated: 52 mL/min — ABNORMAL LOW (ref >60)
Glucose, Bld: 450 mg/dL — ABNORMAL HIGH (ref 70–99)
Potassium: 3.9 mmol/L (ref 3.5–5.1)
Sodium: 136 mmol/L (ref 135–145)
Total Bilirubin: 0.6 mg/dL (ref 0.3–1.2)
Total Protein: 7.8 g/dL (ref 6.5–8.1)

## 2021-11-11 LAB — PROTEIN AND GLUCOSE, CSF
Glucose, CSF: 245 mg/dL — ABNORMAL HIGH (ref 40–70)
Total  Protein, CSF: 75 mg/dL — ABNORMAL HIGH (ref 15–45)

## 2021-11-11 LAB — CSF CELL COUNT WITH DIFFERENTIAL
RBC Count, CSF: 19 /mm3 — ABNORMAL HIGH
Tube #: 3
WBC, CSF: 6 /mm3 — ABNORMAL HIGH (ref 0–5)

## 2021-11-11 LAB — PROTIME-INR
INR: 1.1 (ref 0.8–1.2)
Prothrombin Time: 14.1 seconds (ref 11.4–15.2)

## 2021-11-11 LAB — MAGNESIUM
Magnesium: 1.7 mg/dL (ref 1.7–2.4)
Magnesium: 1.3 mg/dL — ABNORMAL LOW (ref 1.7–2.4)

## 2021-11-11 LAB — LACTIC ACID, PLASMA
Lactic Acid, Venous: 3 mmol/L (ref 0.5–1.9)
Lactic Acid, Venous: 5.9 mmol/L (ref 0.5–1.9)
Lactic Acid, Venous: 7.7 mmol/L (ref 0.5–1.9)

## 2021-11-11 LAB — RAPID URINE DRUG SCREEN, HOSP PERFORMED
Amphetamines: NOT DETECTED
Barbiturates: NOT DETECTED
Benzodiazepines: NOT DETECTED
Cocaine: NOT DETECTED
Opiates: NOT DETECTED
Tetrahydrocannabinol: NOT DETECTED

## 2021-11-11 LAB — I-STAT ARTERIAL BLOOD GAS, ED
Acid-Base Excess: 4 mmol/L — ABNORMAL HIGH (ref 0.0–2.0)
Bicarbonate: 29.2 mmol/L — ABNORMAL HIGH (ref 20.0–28.0)
Calcium, Ion: 1.18 mmol/L (ref 1.15–1.40)
HCT: 37 % (ref 36.0–46.0)
Hemoglobin: 12.6 g/dL (ref 12.0–15.0)
O2 Saturation: 100 %
Patient temperature: 99.9
Potassium: 3.2 mmol/L — ABNORMAL LOW (ref 3.5–5.1)
Sodium: 136 mmol/L (ref 135–145)
TCO2: 31 mmol/L (ref 22–32)
pCO2 arterial: 47.7 mmHg (ref 32–48)
pH, Arterial: 7.399 (ref 7.35–7.45)
pO2, Arterial: 365 mmHg — ABNORMAL HIGH (ref 83–108)

## 2021-11-11 LAB — MRSA NEXT GEN BY PCR, NASAL: MRSA by PCR Next Gen: NOT DETECTED

## 2021-11-11 LAB — AMMONIA: Ammonia: 15 umol/L (ref 9–35)

## 2021-11-11 LAB — ACETAMINOPHEN LEVEL: Acetaminophen (Tylenol), Serum: <10 ug/mL — ABNORMAL LOW (ref 10–30)

## 2021-11-11 LAB — SALICYLATE LEVEL: Salicylate Lvl: <7 mg/dL — ABNORMAL LOW (ref 7.0–30.0)

## 2021-11-11 LAB — TSH: TSH: 2.434 u[IU]/mL (ref 0.350–4.500)

## 2021-11-11 LAB — PROCALCITONIN: Procalcitonin: <0.1 ng/mL

## 2021-11-11 LAB — BETA-HYDROXYBUTYRIC ACID
Beta-Hydroxybutyric Acid: 0.62 mmol/L — ABNORMAL HIGH (ref 0.05–0.27)
Beta-Hydroxybutyric Acid: 0.19 mmol/L (ref 0.05–0.27)
Beta-Hydroxybutyric Acid: 0.08 mmol/L (ref 0.05–0.27)

## 2021-11-11 LAB — CK: Total CK: 86 U/L (ref 38–234)

## 2021-11-11 LAB — HIV ANTIBODY (ROUTINE TESTING W REFLEX): HIV Screen 4th Generation wRfx: NONREACTIVE

## 2021-11-11 LAB — PHOSPHORUS: Phosphorus: 1.3 mg/dL — ABNORMAL LOW (ref 2.5–4.6)

## 2021-11-11 LAB — ETHANOL: Alcohol, Ethyl (B): <10 mg/dL (ref <10)

## 2021-11-11 LAB — OSMOLALITY: Osmolality: 308 mOsm/kg — ABNORMAL HIGH (ref 275–295)

## 2021-11-11 LAB — APTT: aPTT: 20 seconds — ABNORMAL LOW (ref 24–36)

## 2021-11-11 MED ORDER — AMLODIPINE BESYLATE 10 MG PO TABS
10.0000 mg | ORAL_TABLET | Freq: Every day | ORAL | Status: DC
  Administered 2021-11-12 – 2021-11-13 (×2): 10 mg
  Filled 2021-11-11 (×2): qty 1

## 2021-11-11 MED ORDER — SODIUM CHLORIDE 0.9 % IV SOLN
2.0000 g | Freq: Once | INTRAVENOUS | Status: AC
  Administered 2021-11-11: 2 g via INTRAVENOUS
  Filled 2021-11-11: qty 20

## 2021-11-11 MED ORDER — ONDANSETRON HCL 4 MG/2ML IJ SOLN
INTRAMUSCULAR | Status: AC
  Administered 2021-11-11: 4 mg via INTRAVENOUS
  Filled 2021-11-11: qty 2

## 2021-11-11 MED ORDER — PROSOURCE TF20 ENFIT COMPATIBL EN LIQD
60.0000 mL | Freq: Every day | ENTERAL | Status: DC
  Administered 2021-11-12: 60 mL
  Filled 2021-11-11: qty 60

## 2021-11-11 MED ORDER — SODIUM CHLORIDE 0.9% FLUSH: 10.0000 mL | INTRAVENOUS | Status: DC | PRN

## 2021-11-11 MED ORDER — ONDANSETRON HCL 4 MG/2ML IJ SOLN: 4.0000 mg | Freq: Once | INTRAMUSCULAR | Status: AC

## 2021-11-11 MED ORDER — HEPARIN SODIUM (PORCINE) 5000 UNIT/ML IJ SOLN
5000.0000 [IU] | Freq: Three times a day (TID) | INTRAMUSCULAR | Status: DC
  Administered 2021-11-11 – 2021-11-18 (×21): 5000 [IU] via SUBCUTANEOUS
  Filled 2021-11-11 (×21): qty 1

## 2021-11-11 MED ORDER — DEXTROSE 50 % IV SOLN: 0.0000 mL | INTRAVENOUS | Status: DC | PRN

## 2021-11-11 MED ORDER — VANCOMYCIN HCL 2000 MG/400ML IV SOLN
2000.0000 mg | Freq: Once | INTRAVENOUS | Status: AC
  Administered 2021-11-11: 2000 mg via INTRAVENOUS
  Filled 2021-11-11: qty 400

## 2021-11-11 MED ORDER — FENTANYL 2500MCG IN NS 250ML (10MCG/ML) PREMIX INFUSION
25.0000 ug/h | INTRAVENOUS | Status: DC
  Administered 2021-11-11 (×2): 25 ug/h via INTRAVENOUS
  Administered 2021-11-12 – 2021-11-13 (×2): 200 ug/h via INTRAVENOUS
  Filled 2021-11-11 (×4): qty 250

## 2021-11-11 MED ORDER — ASPIRIN 81 MG PO CHEW
81.0000 mg | CHEWABLE_TABLET | Freq: Every day | ORAL | Status: DC
  Administered 2021-11-11 – 2021-11-13 (×3): 81 mg
  Filled 2021-11-11 (×3): qty 1

## 2021-11-11 MED ORDER — VITAL HIGH PROTEIN PO LIQD: 1000.0000 mL | ORAL | Status: DC

## 2021-11-11 MED ORDER — SODIUM CHLORIDE 0.9 % IV SOLN
2.0000 g | Freq: Once | INTRAVENOUS | Status: AC
  Administered 2021-11-11: 2 g via INTRAVENOUS
  Filled 2021-11-11: qty 2000

## 2021-11-11 MED ORDER — POTASSIUM CHLORIDE 10 MEQ/100ML IV SOLN
10.0000 meq | INTRAVENOUS | Status: AC
  Administered 2021-11-11 (×4): 10 meq via INTRAVENOUS
  Filled 2021-11-11 (×2): qty 100

## 2021-11-11 MED ORDER — LACTATED RINGERS IV BOLUS
20.0000 mL/kg | Freq: Once | INTRAVENOUS | Status: AC
  Administered 2021-11-11: 1906 mL via INTRAVENOUS

## 2021-11-11 MED ORDER — SODIUM CHLORIDE 0.9% FLUSH: 3.0000 mL | Freq: Once | INTRAVENOUS | Status: DC

## 2021-11-11 MED ORDER — THIAMINE HCL 100 MG/ML IJ SOLN
500.0000 mg | Freq: Every day | INTRAVENOUS | Status: DC
  Administered 2021-11-11: 500 mg via INTRAVENOUS
  Filled 2021-11-11 (×2): qty 5

## 2021-11-11 MED ORDER — VANCOMYCIN HCL 2000 MG/400ML IV SOLN
2000.0000 mg | Freq: Once | INTRAVENOUS | Status: DC
  Filled 2021-11-11: qty 400

## 2021-11-11 MED ORDER — DOCUSATE SODIUM 50 MG/5ML PO LIQD
100.0000 mg | Freq: Two times a day (BID) | ORAL | Status: DC
  Administered 2021-11-12 – 2021-11-13 (×3): 100 mg
  Filled 2021-11-11 (×3): qty 10

## 2021-11-11 MED ORDER — DEXTROSE 5 % IV SOLN
10.0000 mg/kg | Freq: Once | INTRAVENOUS | Status: AC
  Administered 2021-11-11: 965 mg via INTRAVENOUS
  Filled 2021-11-11: qty 19.3

## 2021-11-11 MED ORDER — FENTANYL BOLUS VIA INFUSION
25.0000 ug | INTRAVENOUS | Status: DC | PRN
  Administered 2021-11-12 – 2021-11-13 (×2): 100 ug via INTRAVENOUS

## 2021-11-11 MED ORDER — SODIUM CHLORIDE 0.9% FLUSH
10.0000 mL | Freq: Two times a day (BID) | INTRAVENOUS | Status: DC
  Administered 2021-11-12: 10 mL
  Administered 2021-11-12: 20 mL
  Administered 2021-11-13 – 2021-11-18 (×10): 10 mL

## 2021-11-11 MED ORDER — FENTANYL CITRATE PF 50 MCG/ML IJ SOSY: 25.0000 ug | PREFILLED_SYRINGE | INTRAMUSCULAR | Status: DC | PRN

## 2021-11-11 MED ORDER — POTASSIUM CHLORIDE 10 MEQ/100ML IV SOLN
10.0000 meq | INTRAVENOUS | Status: AC
  Administered 2021-11-11 (×2): 10 meq via INTRAVENOUS
  Filled 2021-11-11 (×2): qty 100

## 2021-11-11 MED ORDER — CHLORHEXIDINE GLUCONATE CLOTH 2 % EX PADS
6.0000 | MEDICATED_PAD | Freq: Every day | CUTANEOUS | Status: DC
  Administered 2021-11-11 – 2021-11-16 (×7): 6 via TOPICAL

## 2021-11-11 MED ORDER — INSULIN REGULAR(HUMAN) IN NACL 100-0.9 UT/100ML-% IV SOLN
INTRAVENOUS | Status: DC
  Administered 2021-11-11: 15 [IU]/h via INTRAVENOUS
  Administered 2021-11-11: 18 [IU]/h via INTRAVENOUS
  Administered 2021-11-12: 13 [IU]/h via INTRAVENOUS
  Filled 2021-11-11 (×3): qty 100

## 2021-11-11 MED ORDER — NICARDIPINE HCL IN NACL 20-0.86 MG/200ML-% IV SOLN
3.0000 mg/h | INTRAVENOUS | Status: DC
  Administered 2021-11-11: 5 mg/h via INTRAVENOUS
  Administered 2021-11-11: 15 mg/h via INTRAVENOUS
  Administered 2021-11-11: 5 mg/h via INTRAVENOUS
  Filled 2021-11-11 (×7): qty 200

## 2021-11-11 MED ORDER — PROPOFOL 1000 MG/100ML IV EMUL
0.0000 ug/kg/min | INTRAVENOUS | Status: DC
  Administered 2021-11-11: 10 ug/kg/min via INTRAVENOUS
  Administered 2021-11-11 – 2021-11-12 (×3): 35 ug/kg/min via INTRAVENOUS
  Administered 2021-11-12: 15 ug/kg/min via INTRAVENOUS
  Administered 2021-11-12: 30 ug/kg/min via INTRAVENOUS
  Administered 2021-11-13: 20 ug/kg/min via INTRAVENOUS
  Administered 2021-11-13: 15 ug/kg/min via INTRAVENOUS
  Filled 2021-11-11 (×5): qty 100
  Filled 2021-11-11: qty 200
  Filled 2021-11-11: qty 100

## 2021-11-11 MED ORDER — VANCOMYCIN HCL IN DEXTROSE 1-5 GM/200ML-% IV SOLN: 1000.0000 mg | Freq: Once | INTRAVENOUS | Status: DC

## 2021-11-11 MED ORDER — ATORVASTATIN CALCIUM 40 MG PO TABS
40.0000 mg | ORAL_TABLET | Freq: Every day | ORAL | Status: DC
  Administered 2021-11-11 – 2021-11-13 (×3): 40 mg
  Filled 2021-11-11 (×3): qty 1

## 2021-11-11 MED ORDER — PANTOPRAZOLE 2 MG/ML SUSPENSION
40.0000 mg | Freq: Every day | ORAL | Status: DC
  Administered 2021-11-11 – 2021-11-13 (×3): 40 mg
  Filled 2021-11-11 (×3): qty 20

## 2021-11-11 MED ORDER — AMLODIPINE-ATORVASTATIN 10-40 MG PO TABS: 1.0000 | ORAL_TABLET | Freq: Every day | ORAL | Status: DC

## 2021-11-11 MED ORDER — DEXTROSE IN LACTATED RINGERS 5 % IV SOLN: INTRAVENOUS | Status: DC

## 2021-11-11 MED ORDER — POTASSIUM CHLORIDE 10 MEQ/100ML IV SOLN
10.0000 meq | INTRAVENOUS | Status: AC
  Filled 2021-11-11: qty 100

## 2021-11-11 MED ORDER — LACTATED RINGERS IV SOLN: INTRAVENOUS | Status: DC

## 2021-11-11 MED ORDER — ROCURONIUM BROMIDE 50 MG/5ML IV SOLN
INTRAVENOUS | Status: AC | PRN
  Administered 2021-11-11: 100 mg via INTRAVENOUS

## 2021-11-11 MED ORDER — ASPIRIN 81 MG PO TBEC: 81.0000 mg | DELAYED_RELEASE_TABLET | Freq: Every day | ORAL | Status: DC

## 2021-11-11 MED ORDER — MAGNESIUM SULFATE 4 GM/100ML IV SOLN
4.0000 g | Freq: Once | INTRAVENOUS | Status: AC
  Administered 2021-11-11: 4 g via INTRAVENOUS
  Filled 2021-11-11: qty 100

## 2021-11-11 MED ORDER — POLYETHYLENE GLYCOL 3350 17 G PO PACK
17.0000 g | PACK | Freq: Every day | ORAL | Status: DC
  Administered 2021-11-13 – 2021-11-18 (×4): 17 g
  Filled 2021-11-11 (×5): qty 1

## 2021-11-11 MED ORDER — POTASSIUM PHOSPHATES 15 MMOLE/5ML IV SOLN
30.0000 mmol | Freq: Once | INTRAVENOUS | Status: AC
  Administered 2021-11-11: 30 mmol via INTRAVENOUS
  Filled 2021-11-11: qty 10

## 2021-11-11 MED ORDER — FENTANYL CITRATE PF 50 MCG/ML IJ SOSY
25.0000 ug | PREFILLED_SYRINGE | Freq: Once | INTRAMUSCULAR | Status: AC
  Administered 2021-11-11: 25 ug via INTRAVENOUS
  Filled 2021-11-11: qty 1

## 2021-11-11 MED ORDER — K PHOS MONO-SOD PHOS DI & MONO 155-852-130 MG PO TABS
250.0000 mg | ORAL_TABLET | Freq: Once | ORAL | Status: AC
  Administered 2021-11-11: 250 mg
  Filled 2021-11-11: qty 1

## 2021-11-11 MED ORDER — ACETAMINOPHEN 325 MG PO TABS
650.0000 mg | ORAL_TABLET | ORAL | Status: DC | PRN
  Administered 2021-11-11 – 2021-11-12 (×2): 650 mg via ORAL
  Filled 2021-11-11 (×3): qty 2

## 2021-11-11 MED ORDER — ETOMIDATE 2 MG/ML IV SOLN
INTRAVENOUS | Status: AC | PRN
  Administered 2021-11-11: 20 mg via INTRAVENOUS

## 2021-11-11 MED ORDER — IOHEXOL 350 MG/ML SOLN
100.0000 mL | Freq: Once | INTRAVENOUS | Status: AC | PRN
  Administered 2021-11-11: 100 mL via INTRAVENOUS

## 2021-11-11 MED ORDER — POLYETHYLENE GLYCOL 3350 17 G PO PACK: 17.0000 g | PACK | Freq: Every day | ORAL | Status: DC | PRN

## 2021-11-11 MED ORDER — POTASSIUM CHLORIDE 10 MEQ/100ML IV SOLN
10.0000 meq | INTRAVENOUS | Status: AC
  Administered 2021-11-11 – 2021-11-12 (×3): 10 meq via INTRAVENOUS
  Filled 2021-11-11 (×3): qty 100

## 2021-11-11 MED ORDER — DOCUSATE SODIUM 100 MG PO CAPS: 100.0000 mg | ORAL_CAPSULE | Freq: Two times a day (BID) | ORAL | Status: DC | PRN

## 2021-11-11 MED ORDER — DEXAMETHASONE SODIUM PHOSPHATE 10 MG/ML IJ SOLN
10.0000 mg | Freq: Once | INTRAMUSCULAR | Status: AC
  Administered 2021-11-11: 10 mg via INTRAVENOUS
  Filled 2021-11-11: qty 1

## 2021-11-11 NOTE — Code Documentation (Signed)
Stroke Response Nurse Documentation Code Documentation  Victoria Jimenez is a 71 y.o. female arriving to [facility]  via [facility] on 10/3 with past medical hx of HTN, OSA, DM. On No antithrombotic. Code stroke was activated by EMS.   Patient from home where she was LKW at 2000 and now complaining of right sided weakness and left gaze.   Stroke team at the bedside on patient arrival. Labs drawn and patient cleared for CT by Dr. Betsey Holiday. Patient to CT with team. NIHSS 20, see documentation for details and code stroke times. Patient with decreased LOC, disoriented, not following commands, left gaze preference , right hemianopia, right arm weakness, right leg weakness, Global aphasia , and dysarthria  on exam. The following imaging was completed:  CT Head, CTA, CTP, and MRI. Patient is not a candidate for IV Thrombolytic due to Outside of window. Patient is not a candidate for IR due to No LVO.   Care Plan: EEG.   Bedside handoff with ED RN Ariella.    Madelynn Done  Rapid Response RN

## 2021-11-11 NOTE — ED Provider Notes (Signed)
[facility] EMERGENCY DEPARTMENT Provider Note   CSN: [id number] Arrival date & time: 11/11/21  9937  An emergency department physician performed an initial assessment on this suspected stroke patient at 0520.  History  Chief Complaint  Patient presents with   Code Stroke    Victoria Jimenez is a 71 y.o. female.  Presents to the emergency department as a code stroke.  Patient brought to the ER from home.  Last known normal was 10 PM.  She had been fine until she went to bed at that time.  Family heard her fall and found her next to her bed.  She was not responsive, had vomited.  EMS found the patient hypertensive, not responding or following commands.  She has a left facial droop, right-sided weakness with left sided gaze deviation.  Blood sugar elevated as well prehospital.       Home Medications Prior to Admission medications   Medication Sig Start Date End Date Taking? Authorizing Provider  ACCU-CHEK AVIVA PLUS test strip As directed 07/04/15   [provider]  acetaminophen (TYLENOL) 325 MG tablet Take 650 mg by mouth every 6 (six) hours as needed.    [provider]  amLODipine-atorvastatin (CADUET) 10-40 MG per tablet Take 1 tablet by mouth daily.    [provider]  aspirin EC 81 MG tablet Take 81 mg by mouth daily.    [provider]  BD PEN NEEDLE NANO U/F 32G X 4 MM MISC See admin instructions. As directed 08/21/15   [provider]  bisacodyl (DULCOLAX) 5 MG EC tablet Take 1 tablet (5 mg total) by mouth daily as needed for moderate constipation. 09/05/15   Mikhail, Velta Addison, DO  gabapentin (NEURONTIN) 300 MG capsule Take 300 mg by mouth 3 (three) times daily. 01/27/16   [provider]  HUMULIN N KWIKPEN 100 UNIT/ML Kiwkpen Inject 36 Units into the skin 2 (two) times daily.  Patient not taking: Reported on 08/11/2019 08/29/15   [provider]  hydrochlorothiazide (HYDRODIURIL) 25 MG tablet Take  25 mg by mouth daily. 02/05/16   [provider]  meloxicam (MOBIC) 15 MG tablet Take 15 mg by mouth daily. 08/16/15   [provider]  metFORMIN (GLUCOPHAGE) 1000 MG tablet Take 1,000 mg by mouth 2 (two) times daily. 10/07/18   [provider]  pantoprazole (PROTONIX) 40 MG tablet Take 40 mg by mouth daily.    [provider]  PATADAY 0.2 % SOLN Place 2 drops into both eyes 2 (two) times daily. 06/26/15   [provider]  polyethylene glycol (MIRALAX / GLYCOLAX) packet Take 17 g by mouth daily as needed for mild constipation. 09/05/15   Mikhail, Velta Addison, DO  POTASSIUM PO Take 1-2 tablets by mouth daily as needed (for cramping (legs & feet)).    [provider]  Flossie Buffy SHORT PEN NEEDLES 31G X 8 MM MISC As directed 07/04/15   [provider]      Allergies    Patient has no known allergies.    Review of Systems   Review of Systems  Physical Exam Updated Vital Signs BP (!) 208/89   Pulse (!) 101   Temp (!) 100.5 F (38.1 C) (Rectal)   Resp 20   Ht '5\' 6"'$  (1.676 m)   Wt 95.3 kg   SpO2 100%   BMI 33.89 kg/m  Physical Exam Vitals and nursing note reviewed.  Constitutional:      General: She is in acute distress.  Appearance: She is well-developed.  HENT:     Head: Normocephalic and atraumatic.     Mouth/Throat:     Mouth: Mucous membranes are moist.  Eyes:     General: Vision grossly intact. Gaze aligned appropriately.     Conjunctiva/sclera: Conjunctivae normal.     Comments: Leftward gaze, does not track to the right  Cardiovascular:     Rate and Rhythm: Normal rate and regular rhythm.     Pulses: Normal pulses.     Heart sounds: Normal heart sounds, S1 normal and S2 normal. No murmur heard.    No friction rub. No gallop.  Pulmonary:     Effort: Pulmonary effort is normal. No respiratory distress.     Breath sounds: Normal breath sounds.  Abdominal:     General: Bowel sounds are normal.     Palpations:  Abdomen is soft.     Tenderness: There is no abdominal tenderness. There is no guarding or rebound.     Hernia: No hernia is present.  Musculoskeletal:        General: No swelling.     Cervical back: Full passive range of motion without pain, normal range of motion and neck supple. No spinous process tenderness or muscular tenderness. Normal range of motion.     Right lower leg: No edema.     Left lower leg: No edema.  Skin:    General: Skin is warm and dry.     Capillary Refill: Capillary refill takes less than 2 seconds.     Findings: No ecchymosis, erythema, rash or wound.  Neurological:     General: No focal deficit present.     Mental Status: She is lethargic.     Cranial Nerves: Facial asymmetry present.     Motor: Weakness and abnormal muscle tone present.  Psychiatric:        Attention and Perception: Attention normal.        Mood and Affect: Mood normal.        Speech: Speech normal.        Behavior: Behavior normal.     ED Results / Procedures / Treatments   Labs (all labs ordered are listed, but only abnormal results are displayed) Labs Reviewed  COMPREHENSIVE METABOLIC PANEL - Abnormal; Notable for the following components:      Result Value   CO2 18 (*)    Glucose, Bld 450 (*)    Creatinine, Ser 1.14 (*)    GFR, Estimated 52 (*)    Anion gap 18 (*)    All other components within normal limits  I-STAT CHEM 8, ED - Abnormal; Notable for the following components:   Chloride 97 (*)    Glucose, Bld 441 (*)    All other components within normal limits  CULTURE, BLOOD (SINGLE)  CBC  DIFFERENTIAL  ETHANOL  PROTIME-INR  APTT  BLOOD GAS, ARTERIAL  CBG MONITORING, ED    EKG None  Radiology EEG adult  Result Date: 11/11/2021 Greta Doom, MD     11/11/2021  7:01 AM History: 71 yo F with acute right sided weakness and aphasai Sedation: none Technique: This EEG was acquired with electrodes placed according to the International 10-20 electrode system  (including Fp1, Fp2, F3, F4, C3, C4, P3, P4, O1, O2, T3, T4, T5, T6, A1, A2, Fz, Cz, Pz). The following electrodes were missing or displaced: none. Background: The background is obscured with muscle artifact through much of the recording, but appears to consist predominantly of low  voltage alpha and beta activities.  I do not see any definite posterior dominant rhythm. Photic stimulation: Physiologic driving is not performed EEG Abnormalities: 1) generalized slow activity 2) attenuated or absent PDR Clinical Interpretation: This EEG is consistent with a generalized nonspecific cerebral dysfunction (encephalopathy). There was no seizure or seizure predisposition recorded on this study. Please note that lack of epileptiform activity on EEG does not preclude the possibility of epilepsy. Roland Rack, MD [facility] [phone] If 7pm- 7am, please page neurology on call as listed in [place].   MR BRAIN WO CONTRAST  Result Date: 11/11/2021 CLINICAL DATA:  Stroke follow-up EXAM: MRI HEAD WITHOUT CONTRAST TECHNIQUE: Multiplanar, multiecho pulse sequences of the brain and surrounding structures were obtained without intravenous contrast. COMPARISON:  Head CT and CTA from earlier today and brain MRI 09/30/2021 FINDINGS: Truncated brain MRI, diffusion, FLAIR, and gradient which required. No acute infarct by diffusion. 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 Very motion degraded gradient imaging which is negative for acute hemorrhage. No hydrocephalus or evidence of collection. IMPRESSION: Motion degraded brain MRI without acute finding or change from 09/30/2021. Electronically Signed   By: Jorje Guild M.D.   On: 11/11/2021 06:11   CT ANGIO HEAD NECK W WO CM W PERF (CODE STROKE)  Result Date: 11/11/2021 CLINICAL DATA:  Code stroke.   Concern for LV 0 EXAM: CT ANGIOGRAPHY HEAD AND NECK CT PERFUSION BRAIN TECHNIQUE: Multidetector CT imaging of the head and neck was performed using the standard protocol during bolus administration of intravenous contrast. Multiplanar CT image reconstructions and MIPs were obtained to evaluate the vascular anatomy. Carotid stenosis measurements (when applicable) are obtained utilizing NASCET criteria, using the distal internal carotid diameter as the denominator. Multiphase CT imaging of the brain was performed following IV bolus contrast injection. Subsequent parametric perfusion maps were calculated using RAPID software. RADIATION DOSE REDUCTION: This exam was performed according to the departmental dose-optimization program which includes automated exposure control, adjustment of the mA and/or kV according to patient size and/or use of iterative reconstruction technique. CONTRAST:  174m OMNIPAQUE IOHEXOL 350 MG/ML SOLN COMPARISON:  Brain MRI 09/30/2021 FINDINGS: CTA NECK FINDINGS Aortic arch: 2 vessel branching.  No significant finding Right carotid system: Mild calcified plaque at the bifurcation. No stenosis or ulceration Left carotid system: Mild to moderate calcified plaque at the bifurcation. No stenosis or ulceration. Vertebral arteries: Codominant vertebral arteries are smoothly contoured and widely patent to the dura. Skeleton: Ordinary cervical spine degeneration. Other neck: No acute finding Upper chest: Clear apical lungs Review of the MIP images confirms the above findings CTA HEAD FINDINGS Anterior circulation: No significant stenosis, proximal occlusion, aneurysm, or vascular malformation. Posterior circulation: No proximal occlusion, aneurysm, or vascular malformation. Mild to moderate right P2 segment narrowing. Venous sinuses: Unremarkable for contrast timing Anatomic variants: None significant Review of the MIP images confirms the above findings CT Brain Perfusion Findings: CT perfusion was  attempted but is nondiagnostic due to processing failure. Review of source images shows the patient turned approximately 45 degrees during the scan acquisition, likely the confounding factor. IMPRESSION: 1. No emergent finding including large vessel occlusion. 2. Atherosclerosis without flow limiting stenosis or ulceration of major vessels in the head and neck. Electronically Signed   By: JJorje GuildM.D.   On: 11/11/2021 05:53   CT HEAD CODE STROKE WO CONTRAST  Result Date: 11/11/2021 CLINICAL DATA:  Code stroke. EXAM: CT HEAD WITHOUT CONTRAST TECHNIQUE: Contiguous axial images were obtained from the base of the skull through the vertex without intravenous contrast. RADIATION DOSE REDUCTION: This exam was performed according to the departmental dose-optimization program which includes automated exposure control, adjustment of the mA and/or kV according to patient size and/or use of iterative reconstruction technique. COMPARISON:  Brain MRI 09/30/2021 FINDINGS: Brain: No evidence of acute infarction, hemorrhage, hydrocephalus, extra-axial collection or mass lesion/mass effect. 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 Vascular: No hyperdense vessel or unexpected calcification. Skull: Normal. Negative for fracture or focal lesion. Sinuses/Orbits: No acute finding. Other: These results were communicated to Dr. Leonel Ramsay at 5:30 am on 11/11/2021 by text page via the [facility] messaging system. ASPECTS [facility] Stroke Program Early CT Score) Not scored without localizing symptom. IMPRESSION: No acute finding. Unchanged remote infarcts when compared to 09/30/2021 brain MRI. Electronically Signed   By: Jorje Guild M.D.   On: 11/11/2021 05:31    Procedures Procedure Name: Intubation Date/Time: 11/11/2021 7:21 AM  Performed by: Orpah Greek, MDPre-anesthesia Checklist: Patient identified, Patient being monitored, Emergency Drugs available, Timeout performed and Suction  available Oxygen Delivery Method: Non-rebreather mask Preoxygenation: Pre-oxygenation with 100% oxygen Induction Type: Rapid sequence Ventilation: Mask ventilation without difficulty Laryngoscope Size: Glidescope and 3 Grade View: Grade I Tube size: 7.5 mm Number of attempts: 1 Placement Confirmation: ETT inserted through vocal cords under direct vision, CO2 detector and Breath sounds checked- equal and bilateral Tube secured with: ETT holder Dental Injury: Teeth and Oropharynx as per pre-operative assessment         Medications Ordered in ED Medications  sodium chloride flush (NS) 0.9 % injection 3 mL (0 mLs Intravenous Hold 11/11/21 0628)  nicardipine (CARDENE) '20mg'$  in 0.86% saline 254m IV infusion (0.1 mg/ml) (10 mg/hr Intravenous Rate/Dose Change 11/11/21 0738)  lactated ringers infusion ( Intravenous New Bag/Given 11/11/21 0707)  cefTRIAXone (ROCEPHIN) 2 g in sodium chloride 0.9 % 100 mL IVPB (has no administration in time range)  ampicillin (OMNIPEN) 2 g in sodium chloride 0.9 % 100 mL IVPB (has no administration in time range)  acyclovir (ZOVIRAX) 965 mg in dextrose 5 % 250 mL IVPB (has no administration in time range)  vancomycin (VANCOREADY) IVPB 2000 mg/400 mL (has no administration in time range)  iohexol (OMNIPAQUE) 350 MG/ML injection 100 mL (100 mLs Intravenous Contrast Given 11/11/21 0541)  dexamethasone (DECADRON) injection 10 mg (10 mg Intravenous Given 11/11/21 0700)  ondansetron (ZOFRAN) injection 4 mg (4 mg Intravenous Given 11/11/21 0659)  etomidate (AMIDATE) injection (20 mg Intravenous Given 11/11/21 0718)  rocuronium (ZEMURON) injection (100 mg Intravenous Given 11/11/21 04259    ED Course/ Medical Decision Making/ A&P                           Medical Decision Making Amount and/or Complexity of Data Reviewed Labs: ordered. Radiology: ordered.  Risk Prescription drug management.   Patient arrived as a code stroke.  Patient with leftward gaze, right-sided  weakness.  Patient does have facial droop.  She is alert but not answering any questions.  Patient had vomited multiple times prehospital.  Patient brought to radiology for code stroke CT.  No evidence of intracranial bleed noted.  Patient underwent CT angio of head and neck, no large vessel occlusions noted.  Patient then brought for MRI to evaluate for possible stroke.  MRI was negative.  At this point PRESS was considered strong possibility for etiology of her symptoms.  Blood pressure control was initiated with Cardene.  Bedside EEG did not show obvious seizure activity.  Patient found to have low-grade fever 100.5.  Meningitis and encephalitis therefore considered.  Empiric coverage initiated.  Patient's alertness decreased and she began to have a snoring respiration pattern, patient intubated.  Discussed with critical care medicine.  They have evaluated the patient here in the ED.  She will be admitted to the ICU and they will perform Lumbar Puncture in the ICU.  CRITICAL CARE Performed by: Orpah Greek   Total critical care time: 40 minutes  Critical care time was exclusive of separately billable procedures and treating other patients.  Critical care was necessary to treat or prevent imminent or life-threatening deterioration.  Critical care was time spent personally by me on the following activities: development of treatment plan with patient and/or surrogate as well as nursing, discussions with consultants, evaluation of patient's response to treatment, examination of patient, obtaining history from patient or surrogate, ordering and performing treatments and interventions, ordering and review of laboratory studies, ordering and review of radiographic studies, pulse oximetry and re-evaluation of patient's condition.         Final Clinical Impression(s) / ED Diagnoses Final diagnoses:  Altered mental status, unspecified altered mental status type  Hypertensive emergency     Rx / DC Orders ED Discharge Orders     None         Nobuko Gsell, Gwenyth Allegra, MD 11/11/21 [phone]

## 2021-11-11 NOTE — ED Notes (Signed)
Critical care at bedside with EDP discussing need for intubation with family, due to patient not being able to maintain their airway independently. Family understanding and decision made to intubate.

## 2021-11-11 NOTE — Sepsis Progress Note (Signed)
Notified provider of need to order fluid bolus.  ?

## 2021-11-11 NOTE — Progress Notes (Signed)
Patient continued to spike fever now Tmax is 104.5 Her lactate is trending up Her blood sugar remain elevated, currently on high-dose insulin infusion  Increase LR at 200 cc/h Started on high-dose thiamine Trend lactate Continue antibiotics     Jacky Kindle, MD [place] [facility] See Amion for pager If no response to pager, please call [phone] until 7pm After 7pm, Please call E-link [phone]

## 2021-11-11 NOTE — Progress Notes (Signed)
EEG complete - results pending 

## 2021-11-11 NOTE — Sepsis Progress Note (Signed)
ELink tracking the Code Sepsis. 

## 2021-11-11 NOTE — Progress Notes (Signed)
LTM EEG hooked up and running - no initial skin breakdown - push button tested - neuro notified. No [facility] monitoring. Pt in ED.

## 2021-11-11 NOTE — Procedures (Signed)
  Lumbar Puncture Procedure Note Date: 11/11/2021 Time: 1030 Indication:  Altered Mental Status Proceduralist: Janine Ores NP Supervisor: Jacky Kindle MD  Consent obtained from [facility] A time-out was completed verifying correct patient, procedure, site, positioning, and special equipment if applicable. All the indications and potential side effects of the procedure were discussed in details (including but not limited to risk of bleeding, infection, nerve injury and post LP headache). Patient/HCPOA was understanding, agreeable and all questions were answered.  The patient was placed in the Right Lateral decubitus position in a semi-fetal position with help from the nursing staff. The area was cleansed and draped in usual sterile fashion. 1% lidocaine was used anesthetize the surrounding skin area. A 3.5-inch spinal needle was placed in the L3-L4 interspace.   Clear  cerebral spinal fluid was obtained  Opening pressure was noted to be 17cm.   Four tubes were filled with 10 mL of CSF. These were sent for the usual tests, including 1 tube to be held for further analysis if needed.  Attending Dr Tacy Learn was available for the entire procedure  Estimated Blood Loss: 104m.  Complications: No complications during the procedure.  Patient was counseled about post-LP headache and how to minimize it. He was advised to report any persistent headache, low back pain, leg tingling, numbness or weakness.   DJanine Ores DNP, FNP-BC [facility] Pager: [phone]

## 2021-11-11 NOTE — TOC Progression Note (Signed)
Transition of Care [facility]) - Progression Note    Patient Details  Name: Victoria Jimenez MRN: [id number] Date of Birth: 08-18-1950  Transition of Care [facility]) CM/SW [place], RN Phone Number:[phone]  11/11/2021, 4:09 PM  Clinical Narrative:     Transition of Care [facility]) Screening Note   Patient Details  Name: Victoria Jimenez Date of Birth: 1950-10-09   Transition of Care [facility]) CM/SW Contact:    Angelita Ingles, RN Phone Number: 11/11/2021, 4:10 PM    Transition of Care Department [facility]) has reviewed patient and no TOC needs have been identified at this time. We will continue to monitor patient advancement through interdisciplinary progression rounds.           Expected Discharge Plan and Services                                                 Social Determinants of Health (SDOH) Interventions    Readmission Risk Interventions     No data to display

## 2021-11-11 NOTE — Procedures (Signed)
History: 71 yo F with acute right sided weakness and aphasai  Sedation: none  Technique: This EEG was acquired with electrodes placed according to the International 10-20 electrode system (including Fp1, Fp2, F3, F4, C3, C4, P3, P4, O1, O2, T3, T4, T5, T6, A1, A2, Fz, Cz, Pz). The following electrodes were missing or displaced: none.   Background: The background is obscured with muscle artifact through much of the recording, but appears to consist predominantly of low voltage alpha and beta activities.  I do not see any definite posterior dominant rhythm.  Photic stimulation: Physiologic driving is not performed  EEG Abnormalities: 1) generalized slow activity 2) attenuated or absent PDR  Clinical Interpretation: This EEG is consistent with a generalized nonspecific cerebral dysfunction (encephalopathy). There was no seizure or seizure predisposition recorded on this study. Please note that lack of epileptiform activity on EEG does not preclude the possibility of epilepsy.   Roland Rack, MD [facility] [phone]  If 7pm- 7am, please page neurology on call as listed in [place].

## 2021-11-11 NOTE — ED Notes (Signed)
EEG at bedside.

## 2021-11-11 NOTE — Progress Notes (Signed)
Attempted to obtain phone consent from the patient's husband, Minna Merritts with no answer at this time.

## 2021-11-11 NOTE — Progress Notes (Signed)
Brief Nutrition Note  Consult received for enteral/tube feeding initiation and management.  Adult Enteral Nutrition Protocol initiated. Full assessment to follow.  OG tube in place with tip located in stomach per x-ray imaging.   Admitting Dx: Hypertensive emergency [I16.1] Altered mental status, unspecified altered mental status type [D89.78] Acute metabolic encephalopathy [E78.41]  Body mass index is 33.89 kg/m. Pt meets criteria for obesity class I based on current BMI.  Labs:  Recent Labs  Lab 11/11/21 0525 11/11/21 0759 11/11/21 0800 11/11/21 0859  NA 136  138  --  137 136  K 3.9  3.8  --  3.4* 3.2*  CL 100  97*  --  95*  --   CO2 18*  --  22  --   BUN 22  21  --  19  --   CREATININE 1.14*  1.00  --  1.06*  --   CALCIUM 9.9  --  10.1  --   MG  --  1.7  --   --   GLUCOSE 450*  441*  --  470*  --     Gustavus Bryant, MS, RD, LDN Inpatient Clinical Dietitian Please see AMiON for contact information.

## 2021-11-11 NOTE — ED Notes (Signed)
Patient transported to MRI 

## 2021-11-11 NOTE — Progress Notes (Signed)
Peripherally Inserted Central Catheter Placement Husband at bedside and signed consent.  The IV Nurse has discussed with the patient and/or persons authorized to consent for the patient, the purpose of this procedure and the potential benefits and risks involved with this procedure.  The benefits include less needle sticks, lab draws from the catheter, and the patient may be discharged home with the catheter. Risks include, but not limited to, infection, bleeding, blood clot (thrombus formation), and puncture of an artery; nerve damage and irregular heartbeat and possibility to perform a PICC exchange if needed/ordered by physician.  Alternatives to this procedure were also discussed.  Bard Power PICC patient education guide, fact sheet on infection prevention and patient information card has been provided to patient /or left at bedside.    PICC Placement Documentation  PICC Triple Lumen 11/11/21 Right Brachial 38 cm 1 cm (Active)  Indication for Insertion or Continuance of Line Limited venous access - need for IV therapy >5 days (PICC only);Vasoactive infusions 11/11/21 1804  Exposed Catheter (cm) 1 cm 11/11/21 1804  Site Assessment Clean, Dry, Intact 11/11/21 1804  Lumen #1 Status Flushed;Saline locked;Blood return noted 11/11/21 1804  Lumen #2 Status Flushed;Saline locked;Blood return noted 11/11/21 1804  Lumen #3 Status Flushed;Saline locked;Blood return noted 11/11/21 1804  Dressing Type Transparent;Securing device 11/11/21 1804  Dressing Status Antimicrobial disc in place 11/11/21 1804  Safety Lock Not Applicable 48/88/91 6945  Line Care Connections checked and tightened 11/11/21 1804  Line Adjustment (NICU/IV Team Only) No 11/11/21 1804  Dressing Intervention New dressing 11/11/21 1804  Dressing Change Due 11/18/21 11/11/21 [place] 11/11/2021, 6:06 PM

## 2021-11-11 NOTE — Progress Notes (Signed)
Inpatient Diabetes Program Recommendations  AACE/ADA: New Consensus Statement on Inpatient Glycemic Control (2015)  Target Ranges:  Prepandial:   less than 140 mg/dL      Peak postprandial:   less than 180 mg/dL (1-2 hours)      Critically ill patients:  140 - 180 mg/dL   Lab Results  Component Value Date   GLUCAP 173 (H) 09/05/2015   HGBA1C 6.4 (H) 09/03/2015    Review of Glycemic Control  Latest Reference Range & Units 11/11/21 05:25 11/11/21 08:00  Glucose 70 - 99 mg/dL 450 (H) 441 (H) 470 (H)   Diabetes history: DM 2 Outpatient Diabetes medications:  Metformin 1000 mg bid Current orders for Inpatient glycemic control:  Decadron 10 mg x 1 IV IV insulin-  Inpatient Diabetes Program Recommendations:    Agree with IV insulin for this patient.  Will follow.   Thanks,  Adah Perl, RN, BC-ADM Inpatient Diabetes Coordinator Pager [phone]  (8a-5p)

## 2021-11-11 NOTE — Progress Notes (Signed)
Patient transported on ventilator from ED Room 27 to 4C37 with no complications noted.

## 2021-11-11 NOTE — ED Triage Notes (Signed)
BIBA ([facility]) from home. Husband states patient LKW '@2100'$  when patient went to bed, heard loud noise and patient fell out of bed. Husband found patient on the floor, non-responsive. Patient had vomited. Patient presents to ED with L-sided deviated gaze and covered in vomit. Husband states patient does not take any blood thinners. Unaware if patient hit head.    EMS VS: BP 221/104 CBG= 543

## 2021-11-11 NOTE — Sepsis Progress Note (Signed)
Notified bedside nurse of need to draw lactic acid.  

## 2021-11-11 NOTE — Consult Note (Signed)
Neurology Consultation Reason for Consult: Code Stroke Referring Physician: Polina, C  CC: Right-sided weakness and aphasia  History is obtained from: Patient  HPI: Victoria Jimenez is a 71 y.o. female with a history of hypertension, hyperlipidemia who is normal when she went to bed last night around 10 PM.  She is relatively independent baseline, went on to [place] this evening.  Her husband heard her fall out of bed in the middle the night, and found her to be on the ground and not talking.  He therefore called 911 and she was brought in as a code stroke.  She had glucoses in the 500s on arrival.  LKW: 10 PM tpa given?: no, outside window Thrombectomy?:  No, no LVO Premorbid modified rankin scale: Zero  Past Medical History:  Diagnosis Date   Allergic rhinitis    Coronary artery disease    Treated by Angioplasty   Diabetes mellitus without complication (HCC)    Type II   GERD (gastroesophageal reflux disease)    Herpes simplex    Hyperlipidemia    Hypertension    OSA (obstructive sleep apnea)    on CPAP   Sleep apnea    wears cpap nightly     Family History  Problem Relation Age of Onset   Coronary artery disease Father    Lung cancer Mother    Diabetes Mother    Hypertension Sister    Diabetes Brother    Diabetes Maternal Grandmother    Colon cancer Neg Hx      Social History:  reports that she has never smoked. She has never used smokeless tobacco. She reports current alcohol use. She reports that she does not use drugs.   Exam: Current vital signs: BP (!) 246/89 (BP Location: Right Arm)   Wt 96.6 kg   BMI 34.37 kg/m  Vital signs in last 24 hours: BP: (219-246)/(89-119) 246/89 (10/03 0547) Weight:  [96.6 kg] 96.6 kg (10/03 0500)   Physical Exam  Constitutional: Appears well-developed and well-nourished.    Neuro: Mental Status: Patient is lethargic but arousable, she is densely aphasic and does not follow commands. Cranial Nerves: II:  Does not blink to threat from the right. Pupils are equal, round, and reactive to light.   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  CT/CTA/MRI negative for any type of ischemic insult.  We will get stat EEG to rule out seizure.  Her blood glucoses are in the 400, though I do not typically see focal deficits from glucoses and that level hyperglycemia could be considered.  She does have a history of herpes simplex, and a low-grade temperature therefore may need to consider lumbar puncture if no other explanation is found.  Hypertensive encephalopathy is possible, and I have seen at least one case of posterior reversible encephalopathy syndrome that was not visible on the initial MRI but was on subsequent imaging, but normal imaging does make this less likely.  Recommendations: 1) stat EEG 2) consider LP with cells,  glucose, protein, meningoencephalitis panel 3) if above are negative, would pursue continuous EEG. 4) manage blood pressure   Roland Rack, MD [facility] [phone]  If 7pm- 7am, please page neurology on call as listed in [place].

## 2021-11-11 NOTE — H&P (Addendum)
NAME:  Victoria Jimenez, MRN:  [id number], DOB:  10/25/1950, LOS: 0 ADMISSION DATE:  11/11/2021, CONSULTATION DATE:  10/3 REFERRING MD:  Dr. Betsey Holiday, CHIEF COMPLAINT:  Acute encephalopathy   History of Present Illness:  Patient is a 71 year old female pertinent PMH of CAD, HTN, HLD, DMT2, herpes simplex, OSA on CPAP presents to [facility] ED on 10/3 with encephalopathy.  Patient's LKN was at 10 PM on 10/2.  Patient's husband heard her fall out of bed in the middle of the night and found her on the ground aphasic.  EMS called and patient had right-sided weakness and aphasia.  Code stroke initiated and brought to [facility] ED.  Upon arrival to [facility] ED on 10/3, patient lethargic but arousable.  Does not follow commands.  Right-sided weakness and left gaze deviation.  Patient CBG 500s and hypertensive with SBP in 240s.  Neuro consulted.  CT, CTA, MRI negative for acute abnormality.  Initial EEG negative for seizure.  Patient started on Cardene for hypertensive emergency.  Tmax 100.3 F.  Given dose of steroids and started on ampicillin, Rocephin, acyclovir for meningitis PPx.  LP pending.  Intubated due to poor mental status.  CXR showing ETT in good position and possible infiltrate and RLL concerning for aspiration.  PCCM consulted for ICU admission.  Pertinent ED labs: Glucose 450, CO2 18, creat 1.14, AG 18, ethanol WNL  Pertinent  Medical History   Past Medical History:  Diagnosis Date   Allergic rhinitis    Coronary artery disease    Treated by Angioplasty   Diabetes mellitus without complication (HCC)    Type II   GERD (gastroesophageal reflux disease)    Herpes simplex    Hyperlipidemia    Hypertension    OSA (obstructive sleep apnea)    on CPAP   Sleep apnea    wears cpap nightly     Significant Hospital Events: Including procedures, antibiotic start and stop dates in addition to other pertinent events   10/3: admitted hypertensive crisis; encephalopathic; intubated for airway  protection  Interim History / Subjective:  See H&P  Objective   Blood pressure (!) 235/103, pulse 89, temperature (!) 100.5 F (38.1 C), temperature source Rectal, resp. rate 19, height '5\' 6"'$  (1.676 m), weight 96.6 kg, SpO2 96 %.       No intake or output data in the 24 hours ending 11/11/21 0715 Filed Weights   11/11/21 0500 11/11/21 0651  Weight: 96.6 kg 96.6 kg    Examination: General:   critically ill appearing on mech vent HEENT: MM pink/moist; ETT in place Neuro: sedated on propofol; PERRL CV: s1s2, RRR, no m/r/g; sbp 210s PULM:  dim clear BS bilaterally; on mech vent PRVC GI: soft, bsx4 active  Extremities: warm/dry, no edema  Skin: no rashes or lesions appreciated   Resolved Hospital Problem list     Assessment & Plan:   Acute encephalopathy: hypertensive vs. Infectious vs. hyperglycemia -R sided weakness and aphasia; CT/MRI/EEG negative; initial EEG negative; ethanol WNL P: -will admit to ICU w/ continuous telemetry monitoring -Neuro following; appreciate recs -Continue Cardene drip for SBP goal less than 180 -IV fluids and insulin gtt per Endo tool -continue acyclovir, ampicillin, Rocephin, vanc -check ua and cultures: bcx2, urine culture/UA, and trach culture -Given history of HSV and low-grade fever, will obtain LP to rule out meningitis/encephalitis -check procalcitonin -Check ABG -send UDS, salicylate, acetaminophen level -Check ammonia and TSH -Initial EEG negative; consider cEEG per neuro -further imaging per neuro -frequent neuro checks -  limit sedating meds -Continue neuroprotective measures- normothermia, euglycemia, HOB greater than 30, head in neutral alignment, normocapnia, normoxia.  Acute respiratory failure: intubated for airway protection Possible aspiration pneumonia Hx of OSA on cpap: seen by Dr. Ander Slade outpt P: -LTVV strategy with tidal volumes of 6-8 cc/kg ideal body weight -check ABG and adjust settings accordingly  -Wean  PEEP/FiO2 for SpO2 >92% -VAP bundle in place -Daily SAT and SBT -PAD protocol in place -wean sedation for RASS goal 0 to -1 -ABX as above -Follow-up trach culture  Sepsis -Possible aspiration pneumonia; LP pending to r/o meningitis/encephalitis P: -IV fluids -Trend LA and check abg -ABX as above -Check UA and cultures -Check LP to rule out meningitis/encephalitis -Check PCT -trend wbc/fever curve  Hypertensive Emergency P: -continuous telemetry -Continue Cardene drip for SBP goal less than 180 -will start home caduet  DKA?: CBG 400s, elevated AG, CO2 18, encephalopathic Hx of DMT2 P: -start on insulin per endotool -iv fluids -check a1c -cbg monitoring -Check osmolality and beta H -ABG pending -serial bmp  Mildly elevated Creatinine P: -IV fluids -Trend BMP / urinary output -Replace electrolytes as indicated -Avoid nephrotoxic agents, ensure adequate renal perfusion  Hx of CAD Hx of htn and hld P: -continue cardene -continue home Caduet -resume asa -hold hctz given mild bump in creat; consider resuming tomorrow  Hx of peripheral neuropathy P: -hold gabapentin given encephalopathy  GERD P: -PPI  Best Practice (right click and "Reselect all SmartList Selections" daily)   Diet/type: NPO w/ meds via tube DVT prophylaxis: prophylactic heparin  GI prophylaxis: PPI Lines: N/A Foley:  Yes, and it is still needed Code Status:  full code Last date of multidisciplinary goals of care discussion [Attempted to call spouse Minna Merritts over phone but no answer.]  Labs   CBC: Recent Labs  Lab 11/11/21 0525  WBC 9.9  NEUTROABS 7.2  HGB 13.7  13.9  HCT 42.0  41.0  MCV 88.2  PLT 053    Basic Metabolic Panel: Recent Labs  Lab 11/11/21 0525  NA 136  138  K 3.9  3.8  CL 100  97*  CO2 18*  GLUCOSE 450*  441*  BUN 22  21  CREATININE 1.14*  1.00  CALCIUM 9.9   GFR: Estimated Creatinine Clearance: 55.3 mL/min (A) (by C-G formula based on SCr of  1.14 mg/dL (H)). Recent Labs  Lab 11/11/21 0525  WBC 9.9    Liver Function Tests: Recent Labs  Lab 11/11/21 0525  AST 21  ALT 18  ALKPHOS 94  BILITOT 0.6  PROT 7.8  ALBUMIN 4.1   No results for input(s): "LIPASE", "AMYLASE" in the last 168 hours. No results for input(s): "AMMONIA" in the last 168 hours.  ABG    Component Value Date/Time   TCO2 27 11/11/2021 0525     Coagulation Profile: No results for input(s): "INR", "PROTIME" in the last 168 hours.  Cardiac Enzymes: No results for input(s): "CKTOTAL", "CKMB", "CKMBINDEX", "TROPONINI" in the last 168 hours.  HbA1C: Hgb A1c MFr Bld  Date/Time Value Ref Range Status  09/03/2015 03:47 AM 6.4 (H) 4.8 - 5.6 % Final    Comment:    (NOTE)         Pre-diabetes: 5.7 - 6.4         Diabetes: >6.4         Glycemic control for adults with diabetes: <7.0     CBG: No results for input(s): "GLUCAP" in the last 168 hours.  Review of Systems:  Patient is encephalopathic and/or intubated. Therefore history has been obtained from chart review.    Past Medical History:  She,  has a past medical history of Allergic rhinitis, Coronary artery disease, Diabetes mellitus without complication ([place]), GERD (gastroesophageal reflux disease), Herpes simplex, Hyperlipidemia, Hypertension, OSA (obstructive sleep apnea), and Sleep apnea.   Surgical History:   Past Surgical History:  Procedure Laterality Date   ABDOMINAL HYSTERECTOMY     ANGIOPLASTY     CARDIAC CATHETERIZATION     TUBAL LIGATION       Social History:   reports that she has never smoked. She has never used smokeless tobacco. She reports current alcohol use. She reports that she does not use drugs.   Family History:  Her family history includes Coronary artery disease in her father; Diabetes in her brother, maternal grandmother, and mother; Hypertension in her sister; Lung cancer in her mother. There is no history of Colon cancer.   Allergies No Known Allergies    Home Medications  Prior to Admission medications   Medication Sig Start Date End Date Taking? Authorizing Provider  ACCU-CHEK AVIVA PLUS test strip As directed 07/04/15   [provider]  acetaminophen (TYLENOL) 325 MG tablet Take 650 mg by mouth every 6 (six) hours as needed.    [provider]  amLODipine-atorvastatin (CADUET) 10-40 MG per tablet Take 1 tablet by mouth daily.    [provider]  aspirin EC 81 MG tablet Take 81 mg by mouth daily.    [provider]  BD PEN NEEDLE NANO U/F 32G X 4 MM MISC See admin instructions. As directed 08/21/15   [provider]  bisacodyl (DULCOLAX) 5 MG EC tablet Take 1 tablet (5 mg total) by mouth daily as needed for moderate constipation. 09/05/15   Mikhail, Velta Addison, DO  gabapentin (NEURONTIN) 300 MG capsule Take 300 mg by mouth 3 (three) times daily. 01/27/16   [provider]  HUMULIN N KWIKPEN 100 UNIT/ML Kiwkpen Inject 36 Units into the skin 2 (two) times daily.  Patient not taking: Reported on 08/11/2019 08/29/15   [provider]  hydrochlorothiazide (HYDRODIURIL) 25 MG tablet Take 25 mg by mouth daily. 02/05/16   [provider]  meloxicam (MOBIC) 15 MG tablet Take 15 mg by mouth daily. 08/16/15   [provider]  metFORMIN (GLUCOPHAGE) 1000 MG tablet Take 1,000 mg by mouth 2 (two) times daily. 10/07/18   [provider]  pantoprazole (PROTONIX) 40 MG tablet Take 40 mg by mouth daily.    [provider]  PATADAY 0.2 % SOLN Place 2 drops into both eyes 2 (two) times daily. 06/26/15   [provider]  polyethylene glycol (MIRALAX / GLYCOLAX) packet Take 17 g by mouth daily as needed for mild constipation. 09/05/15   Mikhail, Velta Addison, DO  POTASSIUM PO Take 1-2 tablets by mouth daily as needed (for cramping (legs & feet)).    [provider]  Flossie Buffy SHORT PEN NEEDLES 31G X 8 MM MISC As directed 07/04/15   [provider]      Critical care time: 45 minutes    JD Rexene Agent [facility] 11/11/2021, 7:15 AM  Please see [email or web address] for pager details.  From 7A-7P if no response, please call [phone]. After hours, please call ELink [phone].

## 2021-11-11 NOTE — ED Notes (Signed)
Chand MD paged regarding pts level of consciousness. At this time pt remains sedated on propofol per order, pt is having movement to L arm and is moving head slightly.

## 2021-11-11 NOTE — Procedures (Signed)
Patient Name: Victoria Jimenez  MRN: [id number]  Epilepsy Attending: Lora Havens  Referring Physician/Provider: Greta Doom, MD  Duration: 11/11/2021 9562 to 11/12/2021 1308  Patient history:  71 y.o. female with a history of hypertension, hyperlipidemia who is normal when she went to bed last night around 10 PM. EEG to evaluate for seizure  Level of alertness: comatose  AEDs during EEG study: Propofol  Technical aspects: This EEG study was done with scalp electrodes positioned according to the 10-20 International system of electrode placement. Electrical activity was reviewed with band pass filter of 1-'70Hz'$ , sensitivity of 7 uV/mm, display speed of 58m/sec with a '60Hz'$  notched filter applied as appropriate. EEG data were recorded continuously and digitally stored.  Video monitoring was available and reviewed as appropriate.  Description: EEG showed intermittent generalized 3 to 6 Hz theta-delta slowing lasting 1 to 2 seconds alternating with 3 to 5 seconds of generalized EEG attenuation. Hyperventilation and photic stimulation were not performed.     Patient was noted to have left upper and left lower extremity tremor-like movements during patient care on 11/12/2021 at 0111.  Concomitant EEG before, during and after the event did not show any EEG change to suggest seizure.  This was most likely not an epileptic event.  Event button was pressed on 11/12/2021 at [place]  Patient was noted to have left upper extremity and right lower extremity tremor-like movements.  Concomitant EEG before, during and after the study did not show any EEG changes suggest seizure.  This was most not an epileptic event.  EKG artifact was seen throughout the study.  Also, parts of study were difficult to interpret due to significant myogenic artifact.  ABNORMALITY -Intermittent slow, generalized -Background attenuation, generalized  IMPRESSION: This study is suggestive of severe to profound diffuse  encephalopathy. No seizures or epileptiform discharges were seen throughout the recording.  Two events were recorded as described above without concomitant EEG change.  These were not epileptic events.  Jory Welke OBarbra Sarks

## 2021-11-12 ENCOUNTER — Inpatient Hospital Stay (HOSPITAL_COMMUNITY): Payer: Medicare Other

## 2021-11-12 DIAGNOSIS — I161 Hypertensive emergency: Secondary | ICD-10-CM | POA: Diagnosis not present

## 2021-11-12 DIAGNOSIS — R4182 Altered mental status, unspecified: Secondary | ICD-10-CM

## 2021-11-12 DIAGNOSIS — G9341 Metabolic encephalopathy: Secondary | ICD-10-CM | POA: Diagnosis not present

## 2021-11-12 LAB — BASIC METABOLIC PANEL
Anion gap: 12 (ref 5–15)
Anion gap: 9 (ref 5–15)
Anion gap: 12 (ref 5–15)
Anion gap: 8 (ref 5–15)
BUN: 28 mg/dL — ABNORMAL HIGH (ref 8–23)
BUN: 28 mg/dL — ABNORMAL HIGH (ref 8–23)
BUN: 28 mg/dL — ABNORMAL HIGH (ref 8–23)
BUN: 30 mg/dL — ABNORMAL HIGH (ref 8–23)
CO2: 21 mmol/L — ABNORMAL LOW (ref 22–32)
CO2: 21 mmol/L — ABNORMAL LOW (ref 22–32)
CO2: 22 mmol/L (ref 22–32)
CO2: 21 mmol/L — ABNORMAL LOW (ref 22–32)
Calcium: 8.6 mg/dL — ABNORMAL LOW (ref 8.9–10.3)
Calcium: 8 mg/dL — ABNORMAL LOW (ref 8.9–10.3)
Calcium: 8.4 mg/dL — ABNORMAL LOW (ref 8.9–10.3)
Calcium: 7.7 mg/dL — ABNORMAL LOW (ref 8.9–10.3)
Chloride: 101 mmol/L (ref 98–111)
Chloride: 102 mmol/L (ref 98–111)
Chloride: 102 mmol/L (ref 98–111)
Chloride: 103 mmol/L (ref 98–111)
Creatinine, Ser: 1.76 mg/dL — ABNORMAL HIGH (ref 0.44–1.00)
Creatinine, Ser: 1.43 mg/dL — ABNORMAL HIGH (ref 0.44–1.00)
Creatinine, Ser: 1.34 mg/dL — ABNORMAL HIGH (ref 0.44–1.00)
Creatinine, Ser: 1.31 mg/dL — ABNORMAL HIGH (ref 0.44–1.00)
GFR, Estimated: 31 mL/min — ABNORMAL LOW (ref >60)
GFR, Estimated: 40 mL/min — ABNORMAL LOW (ref >60)
GFR, Estimated: 43 mL/min — ABNORMAL LOW (ref >60)
GFR, Estimated: 44 mL/min — ABNORMAL LOW (ref >60)
Glucose, Bld: 253 mg/dL — ABNORMAL HIGH (ref 70–99)
Glucose, Bld: 205 mg/dL — ABNORMAL HIGH (ref 70–99)
Glucose, Bld: 173 mg/dL — ABNORMAL HIGH (ref 70–99)
Glucose, Bld: 282 mg/dL — ABNORMAL HIGH (ref 70–99)
Potassium: 3.6 mmol/L (ref 3.5–5.1)
Potassium: 5 mmol/L (ref 3.5–5.1)
Potassium: 4.1 mmol/L (ref 3.5–5.1)
Potassium: 3.5 mmol/L (ref 3.5–5.1)
Sodium: 134 mmol/L — ABNORMAL LOW (ref 135–145)
Sodium: 132 mmol/L — ABNORMAL LOW (ref 135–145)
Sodium: 136 mmol/L (ref 135–145)
Sodium: 132 mmol/L — ABNORMAL LOW (ref 135–145)

## 2021-11-12 LAB — MAGNESIUM
Magnesium: 2.2 mg/dL (ref 1.7–2.4)
Magnesium: 2.4 mg/dL (ref 1.7–2.4)

## 2021-11-12 LAB — GLUCOSE, CAPILLARY
Glucose-Capillary: 124 mg/dL — ABNORMAL HIGH (ref 70–99)
Glucose-Capillary: 124 mg/dL — ABNORMAL HIGH (ref 70–99)
Glucose-Capillary: 134 mg/dL — ABNORMAL HIGH (ref 70–99)
Glucose-Capillary: 136 mg/dL — ABNORMAL HIGH (ref 70–99)
Glucose-Capillary: 140 mg/dL — ABNORMAL HIGH (ref 70–99)
Glucose-Capillary: 142 mg/dL — ABNORMAL HIGH (ref 70–99)
Glucose-Capillary: 144 mg/dL — ABNORMAL HIGH (ref 70–99)
Glucose-Capillary: 148 mg/dL — ABNORMAL HIGH (ref 70–99)
Glucose-Capillary: 149 mg/dL — ABNORMAL HIGH (ref 70–99)
Glucose-Capillary: 151 mg/dL — ABNORMAL HIGH (ref 70–99)
Glucose-Capillary: 158 mg/dL — ABNORMAL HIGH (ref 70–99)
Glucose-Capillary: 159 mg/dL — ABNORMAL HIGH (ref 70–99)
Glucose-Capillary: 169 mg/dL — ABNORMAL HIGH (ref 70–99)
Glucose-Capillary: 187 mg/dL — ABNORMAL HIGH (ref 70–99)
Glucose-Capillary: 193 mg/dL — ABNORMAL HIGH (ref 70–99)
Glucose-Capillary: 198 mg/dL — ABNORMAL HIGH (ref 70–99)
Glucose-Capillary: 201 mg/dL — ABNORMAL HIGH (ref 70–99)
Glucose-Capillary: 202 mg/dL — ABNORMAL HIGH (ref 70–99)
Glucose-Capillary: 203 mg/dL — ABNORMAL HIGH (ref 70–99)
Glucose-Capillary: 208 mg/dL — ABNORMAL HIGH (ref 70–99)
Glucose-Capillary: 213 mg/dL — ABNORMAL HIGH (ref 70–99)
Glucose-Capillary: 217 mg/dL — ABNORMAL HIGH (ref 70–99)
Glucose-Capillary: 244 mg/dL — ABNORMAL HIGH (ref 70–99)

## 2021-11-12 LAB — CBC
HCT: 29.6 % — ABNORMAL LOW (ref 36.0–46.0)
Hemoglobin: 10.1 g/dL — ABNORMAL LOW (ref 12.0–15.0)
MCH: 29 pg (ref 26.0–34.0)
MCHC: 34.1 g/dL (ref 30.0–36.0)
MCV: 85.1 fL (ref 80.0–100.0)
Platelets: 216 10*3/uL (ref 150–400)
RBC: 3.48 MIL/uL — ABNORMAL LOW (ref 3.87–5.11)
RDW: 12.9 % (ref 11.5–15.5)
WBC: 15.9 10*3/uL — ABNORMAL HIGH (ref 4.0–10.5)
nRBC: 0 % (ref 0.0–0.2)

## 2021-11-12 LAB — URINE CULTURE: Culture: NO GROWTH

## 2021-11-12 LAB — TRIGLYCERIDES: Triglycerides: 147 mg/dL (ref <150)

## 2021-11-12 LAB — BETA-HYDROXYBUTYRIC ACID: Beta-Hydroxybutyric Acid: 0.09 mmol/L (ref 0.05–0.27)

## 2021-11-12 LAB — PHOSPHORUS
Phosphorus: 3.9 mg/dL (ref 2.5–4.6)
Phosphorus: 4.6 mg/dL (ref 2.5–4.6)

## 2021-11-12 LAB — CREATININE, URINE, RANDOM: Creatinine, Urine: 169 mg/dL

## 2021-11-12 LAB — N-METHYL-D-ASPARTATE RECPT.IGG: N-methyl-D-Aspartate Recpt.IgG: NEGATIVE

## 2021-11-12 LAB — LACTIC ACID, PLASMA: Lactic Acid, Venous: 2.6 mmol/L (ref 0.5–1.9)

## 2021-11-12 LAB — SODIUM, URINE, RANDOM: Sodium, Ur: 26 mmol/L

## 2021-11-12 MED ORDER — ORAL CARE MOUTH RINSE
15.0000 mL | OROMUCOSAL | Status: DC
  Administered 2021-11-12 – 2021-11-13 (×17): 15 mL via OROMUCOSAL

## 2021-11-12 MED ORDER — LACTATED RINGERS IV SOLN: INTRAVENOUS | Status: DC

## 2021-11-12 MED ORDER — ACETAMINOPHEN 325 MG PO TABS
650.0000 mg | ORAL_TABLET | ORAL | Status: DC | PRN
  Administered 2021-11-12: 650 mg

## 2021-11-12 MED ORDER — ORAL CARE MOUTH RINSE: 15.0000 mL | OROMUCOSAL | Status: DC | PRN

## 2021-11-12 MED ORDER — SODIUM CHLORIDE 0.9 % IV SOLN: INTRAVENOUS | Status: DC

## 2021-11-12 MED ORDER — THIAMINE HCL 100 MG/ML IJ SOLN
250.0000 mg | Freq: Every day | INTRAVENOUS | Status: AC
  Administered 2021-11-15 – 2021-11-17 (×3): 250 mg via INTRAVENOUS
  Filled 2021-11-12 (×3): qty 2.5

## 2021-11-12 MED ORDER — INSULIN REGULAR(HUMAN) IN NACL 100-0.9 UT/100ML-% IV SOLN
INTRAVENOUS | Status: DC
  Administered 2021-11-12: 3.4 [IU]/h via INTRAVENOUS
  Administered 2021-11-13: 11 [IU]/h via INTRAVENOUS
  Filled 2021-11-12 (×2): qty 100

## 2021-11-12 MED ORDER — INSULIN ASPART 100 UNIT/ML IJ SOLN: 0.0000 [IU] | Freq: Every day | INTRAMUSCULAR | Status: DC

## 2021-11-12 MED ORDER — DEXTROSE IN LACTATED RINGERS 5 % IV SOLN: INTRAVENOUS | Status: DC

## 2021-11-12 MED ORDER — VITAL AF 1.2 CAL PO LIQD
1000.0000 mL | ORAL | Status: DC
  Administered 2021-11-12: 1000 mL

## 2021-11-12 MED ORDER — INSULIN ASPART 100 UNIT/ML IJ SOLN: 0.0000 [IU] | Freq: Three times a day (TID) | INTRAMUSCULAR | Status: DC

## 2021-11-12 MED ORDER — THIAMINE HCL 100 MG/ML IJ SOLN
500.0000 mg | Freq: Three times a day (TID) | INTRAVENOUS | Status: AC
  Administered 2021-11-12 – 2021-11-14 (×9): 500 mg via INTRAVENOUS
  Filled 2021-11-12 (×10): qty 5

## 2021-11-12 MED ORDER — DEXTROSE 5 % IV SOLN
10.0000 mg/kg | Freq: Two times a day (BID) | INTRAVENOUS | Status: DC
  Administered 2021-11-12 – 2021-11-14 (×5): 755 mg via INTRAVENOUS
  Filled 2021-11-12 (×6): qty 15.1

## 2021-11-12 NOTE — Progress Notes (Signed)
[facility] Progress Note Patient Name: BRITTANEY BEAULIEU DOB: 1951/01/06 MRN: [id number]   Date of Service  11/12/2021  HPI/Events of Note  KUB reviewed.  eICU Interventions  OG tube in good position in the distal stomach.        Kerry Kass Bronislaus Verdell 11/12/2021, 12:46 AM

## 2021-11-12 NOTE — Progress Notes (Signed)
Inpatient Diabetes Program Recommendations  AACE/ADA: New Consensus Statement on Inpatient Glycemic Control (2015)  Target Ranges:  Prepandial:   less than 140 mg/dL      Peak postprandial:   less than 180 mg/dL (1-2 hours)      Critically ill patients:  140 - 180 mg/dL   Lab Results  Component Value Date   GLUCAP 158 (H) 11/12/2021   HGBA1C 7.4 (H) 11/11/2021    Review of Glycemic Control  Latest Reference Range & Units 11/12/21 04:07 11/12/21 05:02 11/12/21 06:02 11/12/21 07:01 11/12/21 08:04 11/12/21 09:11  Glucose-Capillary 70 - 99 mg/dL 217 (H) 198 (H) 203 (H) 151 (H) 159 (H) 158 (H)  Diabetes history: DM 2 Outpatient Diabetes medications:  Metformin 1000 mg bid  Inpatient Diabetes Program Recommendations:   Agree with continuation of IV insulin until tube feeds at goal rate and insulin drip rates stable.   Thanks,  Adah Perl, RN, BC-ADM Inpatient Diabetes Coordinator Pager [phone]  (8a-5p)

## 2021-11-12 NOTE — Progress Notes (Signed)
LTM maint complete - no skin breakdown under: F3,F7

## 2021-11-12 NOTE — Progress Notes (Addendum)
Initial Nutrition Assessment  DOCUMENTATION CODES:   Not applicable  INTERVENTION:   Initiate tube feeding via OG tube: Vital AF 1.2 at 20 ml/h, increase by 10 ml every 4 hours to goal rate of 65 ml/h (1560 ml per day).  Provides 1872 kcal (2099 kcal total with current propofol), 117 gm protein, 1265 ml free water daily.  NUTRITION DIAGNOSIS:   Inadequate oral intake related to inability to eat as evidenced by NPO status.  GOAL:   Patient will meet greater than or equal to 90% of their needs  MONITOR:   Vent status, Labs, TF tolerance  REASON FOR ASSESSMENT:   Ventilator, Consult Enteral/tube feeding initiation and management  ASSESSMENT:   71 yo female admitted with acute encephalopathy, hypertensive emergency, sepsis, acute respiratory failure, hyperglycemia. PMH includes HTN, HLD, DM, CAD, OSA.  Patient remains on an insulin drip.   Neurology following. MRI negative for stroke. EEG negative for seizures. LP showed elevated glucose and protein. Patient no longer febrile. Concern for Wernicke's encephalitis, high dose thiamine started.   Received MD Consult for TF initiation and management. OG tube in place with tip in the stomach.   Patient is currently intubated on ventilator support MV: 6.2 L/min Temp (24hrs), Avg:101.1 F (38.4 C), Min:97.7 F (36.5 C), Max:104.9 F (40.5 C)  Propofol: 8.6 ml/hr providing 227 kcal from lipid.   Labs reviewed.  CBG: [phone]  Medications reviewed and include Colace, fentanyl, propofol, thiamine, insulin drip.  IVF: D5LR at 125 ml/h.   No recent weight hx available for review.   NUTRITION - FOCUSED PHYSICAL EXAM:  Unable to complete  Diet Order:   Diet Order             Diet NPO time specified  Diet effective now                   EDUCATION NEEDS:   No education needs have been identified at this time  Skin:  Skin Assessment: Reviewed RN Assessment  Last BM:  10/3 type 6  Height:   Ht  Readings from Last 1 Encounters:  11/11/21 '5\' 6"'$  (1.676 m)    Weight:   Wt Readings from Last 1 Encounters:  11/12/21 99.6 kg    Ideal Body Weight:  59.1 kg  BMI:  Body mass index is 35.44 kg/m.  Estimated Nutritional Needs:   Kcal:  1800-2000  Protein:  115-130 gm  Fluid:  >/= 2 L   Lucas Mallow RD, LDN, CNSC Please refer to Amion for contact information.

## 2021-11-12 NOTE — Progress Notes (Signed)
NAME:  Victoria Jimenez, MRN:  [id number], DOB:  1950/04/27, LOS: 1 ADMISSION DATE:  11/11/2021, CONSULTATION DATE:  11/11/2021 REFERRING MD:  EDP, CHIEF COMPLAINT:  Altered Mental Status    History of Present Illness:  This is a 71 year old female with a past medical history of HSV, hyperlipidemia, CAD, hyperlipidemia who presented to the emergency room febrile, hypertensive, altered mental status, requiring intubation and sedation.  Patient was found to be in DKA, with severe symptomatic hypertension, as well as acute metabolic encephalopathy, and admitted for further work-up.  Pertinent  Medical History  HSV, CAD, hyperlipidemia, hypertension  Significant Hospital Events: Including procedures, antibiotic start and stop dates in addition to other pertinent events   10/03: Intubated and admit to ICU  Interim History / Subjective:  Patient is intubated and sedated on my exam.  Patient is weaned off sedation during my exam, and patient wakes up.  Patient is not really able to follow commands.  Patient unable to track.  Patient does have voluntary movement of lower extremities.  Patient is nonverbal.  Objective   Blood pressure (!) 171/72, pulse 77, temperature 100 F (37.8 C), resp. rate 13, height '5\' 6"'$  (1.676 m), weight 99.6 kg, SpO2 99 %.    Vent Mode: PRVC FiO2 (%):  [40 %] 40 % Set Rate:  [20 bmp] 20 bmp Vt Set:  [470 mL] 470 mL PEEP:  [5 cmH20] 5 cmH20 Plateau Pressure:  [9 cmH20-24 cmH20] 24 cmH20   Intake/Output Summary (Last 24 hours) at 11/12/2021 1016 Last data filed at 11/12/2021 0800 Gross per 24 hour  Intake 6056.24 ml  Output 1525 ml  Net 4531.24 ml   Filed Weights   11/11/21 0651 11/11/21 0717 11/12/21 0515  Weight: 96.6 kg 95.3 kg 99.6 kg    Examination: General: Patient is intubated and sedated Eyes: Pinpoint pupils noted Head: Normocephalic, atraumatic  Neck: Supple, nontender, full range of motion, No JVD Cardio: Regular rate and rhythm, no murmur,  rubs, or gallops. Chest: No chest tenderness Pulmonary: Clear to ausculation bilaterally with no rales, rhonchi, and crackles  Abdomen: Soft, with no distention noted Neuro: Not alert or oriented.  Corneal reflex present.  Gag reflex absent.  Patient unable to follow commands.  Patient has slightly able to withdrawal from pain stimuli but not localized.  No ankle clonus noted.  There is random movements of the lower extremities. Skin: No rashes noted   Resolved Hospital Problem list     Assessment & Plan:  This is a 71 year old female with a past medical history of HSV, hyperlipidemia, CAD, hyperlipidemia who presented to the emergency room febrile, hypertensive, altered mental status, requiring intubation and sedation.  Patient was found to be in DKA, with severe symptomatic hypertension, as well as acute metabolic encephalopathy, and admitted for further work-up.  #Acute metabolic encephalopathy #Acute respiratory failure secondary to acute metabolic encephalopathy Patient remains intubated and sedated.  CSF did not point towards any infectious etiology.  Bio fire was negative.  HSV PCR still pending.  Acute metabolic encephalopathy could have been due to severe symptomatic hypertension, diabetic ketoacidosis, lactic acidosis, autoimmune encephalitis, or HSV encephalitis.  We will continue to look for etiology, as it is unclear with the patient spiking a fever yesterday.  No further fevers today.  Per x-ray, less likely pneumonia related.  Sputum culture growing gram-positive cocci. -Continue frequent neurochecks -Continue to wean as tolerated -Vent bundle in place -Daily SAT and SBT -PAD protocol in place -Wean sedation for RASS goal  0 to -1 -Discontinue antibiotics, continue acyclovir -Wean PEEP/FiO2 for oxygenation greater than 92% -HSV PCR pending -Monitor fever curve and white cell count  #DKA, resolving #Type 2 diabetes mellitus A1c 7.4.  Patient remained on insulin drip  overnight.  Glucose has come down into the low 200s and high 100s.  Anion gap is closed.  We will plan to transition to basal bolus insulin.  For now, continue insulin drip, as insulin requirements are too variable. -Frequent CBGs -Trend BMP -Start tube feeds today -Insulin drip, transition to basal bolus  #Severe symptomatic hypertension, resolving  This could have been contributing to encephalopathy.  Patient's blood pressure has come down into the 140s to 150s.  Patient Cardene drip has been stopped.  Transition to home amlodipine 10 mg daily.  Continue to monitor blood pressure. -Start amlodipine 10 mg daily -Continue to monitor blood pressure closely -Monitor for signs of hypertension -Hold home HCTZ given AKI, consider resuming as creatinine function improves  #Acute kidney injury Patient's creatinine has bumped up at 1.47, up from baseline at 1.  This could be in the setting of severe symptomatic hypertension.  We will check urine sodium and urine creatinine.  Unlikely infectious related given negative UA. -Urine sodium and creatinine pending -Continue to monitor creatinine -Continue maintenance fluid  #CAD -Continue home atorvastatin 40 mg daily -Continue home aspirin 81 mg daily  #Diabetic neuropathy -Hold home gabapentin given current state  Best Practice (right click and "Reselect all SmartList Selections" daily)   Diet/type: tubefeeds DVT prophylaxis: prophylactic heparin  GI prophylaxis: PPI Lines: Right PICC Foley:  Yes, and it is still needed Code Status:  full code  Labs   CBC: Recent Labs  Lab 11/11/21 0525 11/11/21 0800 11/11/21 0859 11/12/21 0450  WBC 9.9 11.0*  --  15.9*  NEUTROABS 7.2  --   --   --   HGB 13.7  13.9 14.7 12.6 10.1*  HCT 42.0  41.0 42.0 37.0 29.6*  MCV 88.2 81.7  --  85.1  PLT 263 292  --  858    Basic Metabolic Panel: Recent Labs  Lab 11/11/21 0759 11/11/21 0800 11/11/21 1529 11/11/21 1851 11/12/21 0024 11/12/21 0354  11/12/21 0450 11/12/21 0819  NA  --    < > 137 139 134* 132*  --  136  K  --    < > 3.3* 3.2* 3.6 5.0  --  4.1  CL  --    < > 100 102 101 102  --  102  CO2  --    < > 20* 23 21* 21*  --  22  GLUCOSE  --    < > 386* 244* 253* 205*  --  173*  BUN  --    < > 21 26* 28* 28*  --  28*  CREATININE  --    < > 1.68* 1.79* 1.76* 1.43*  --  1.34*  CALCIUM  --    < > 9.1 9.2 8.6* 8.0*  --  8.4*  MG 1.7  --  1.3*  --   --   --  2.2 2.4  PHOS  --   --  1.3*  --   --   --  3.9 4.6   < > = values in this interval not displayed.   GFR: Estimated Creatinine Clearance: 47.8 mL/min (A) (by C-G formula based on SCr of 1.34 mg/dL (H)). Recent Labs  Lab 11/11/21 0525 11/11/21 0759 11/11/21 0800 11/11/21 1033 11/11/21 1528 11/12/21 0354  11/12/21 0450  PROCALCITON  --  <0.10  --   --   --   --   --   WBC 9.9  --  11.0*  --   --   --  15.9*  LATICACIDVEN  --   --  3.0* 5.9* 7.7* 2.6*  --     Liver Function Tests: Recent Labs  Lab 11/11/21 0525  AST 21  ALT 18  ALKPHOS 94  BILITOT 0.6  PROT 7.8  ALBUMIN 4.1   No results for input(s): "LIPASE", "AMYLASE" in the last 168 hours. Recent Labs  Lab 11/11/21 0800  AMMONIA 15    ABG    Component Value Date/Time   PHART 7.399 11/11/2021 0859   PCO2ART 47.7 11/11/2021 0859   PO2ART 365 (H) 11/11/2021 0859   HCO3 29.2 (H) 11/11/2021 0859   TCO2 31 11/11/2021 0859   O2SAT 100 11/11/2021 0859     Coagulation Profile: Recent Labs  Lab 11/11/21 0759  INR 1.1    Cardiac Enzymes: Recent Labs  Lab 11/11/21 1429  CKTOTAL 86    HbA1C: Hgb A1c MFr Bld  Date/Time Value Ref Range Status  11/11/2021 08:00 AM 7.4 (H) 4.8 - 5.6 % Final    Comment:    (NOTE) Pre diabetes:          5.7%-6.4%  Diabetes:              >6.4%  Glycemic control for   <7.0% adults with diabetes   09/03/2015 03:47 AM 6.4 (H) 4.8 - 5.6 % Final    Comment:    (NOTE)         Pre-diabetes: 5.7 - 6.4         Diabetes: >6.4         Glycemic control for adults  with diabetes: <7.0     CBG: Recent Labs  Lab 11/12/21 0502 11/12/21 0602 11/12/21 0701 11/12/21 0804 11/12/21 0911  GLUCAP 198* 203* 151* 159* 158*    Review of Systems:   Negative except for what is stated in the HPI   Past Medical History:  She,  has a past medical history of Allergic rhinitis, Coronary artery disease, Diabetes mellitus without complication ([place]), GERD (gastroesophageal reflux disease), Herpes simplex, Hyperlipidemia, Hypertension, OSA (obstructive sleep apnea), and Sleep apnea.   Surgical History:   Past Surgical History:  Procedure Laterality Date   ABDOMINAL HYSTERECTOMY     ANGIOPLASTY     CARDIAC CATHETERIZATION     TUBAL LIGATION       Social History:   reports that she has never smoked. She has never used smokeless tobacco. She reports current alcohol use. She reports that she does not use drugs.   Family History:  Her family history includes Coronary artery disease in her father; Diabetes in her brother, maternal grandmother, and mother; Hypertension in her sister; Lung cancer in her mother. There is no history of Colon cancer.   Allergies No Known Allergies   Home Medications  Prior to Admission medications   Medication Sig Start Date End Date Taking? Authorizing Provider  acetaminophen (TYLENOL) 325 MG tablet Take 650 mg by mouth every 6 (six) hours as needed for mild pain, moderate pain, fever or headache.   Yes [provider]  aspirin EC 81 MG tablet Take 81 mg by mouth daily.   Yes [provider]  atorvastatin (LIPITOR) 40 MG tablet Take 40 mg by mouth daily. 07/14/21  Yes [provider]  Calcium Carbonate-Vit D-Min (  CALTRATE 600+D PLUS MINERALS) 600-800 MG-UNIT TABS Take 1 tablet by mouth in the morning and at bedtime.   Yes [provider]  Cholecalciferol (VITAMIN D3) 1000 units CAPS Take 1 capsule by mouth daily.   Yes [provider]  Cranberry-Vitamin C-Probiotic (AZO CRANBERRY PO)  Take 2 capsules by mouth daily.   Yes [provider]  gabapentin (NEURONTIN) 300 MG capsule Take 600 mg by mouth 3 (three) times daily. 01/27/16  Yes [provider]  hydrochlorothiazide (HYDRODIURIL) 25 MG tablet Take 25 mg by mouth daily. 02/05/16  Yes [provider]  loratadine (CLARITIN) 10 MG tablet Take 10 mg by mouth daily.   Yes [provider]  losartan (COZAAR) 100 MG tablet Take 100 mg by mouth daily. 07/14/21  Yes [provider]  metFORMIN (GLUCOPHAGE) 1000 MG tablet Take 1,000 mg by mouth 2 (two) times daily. 10/07/18  Yes [provider]  pantoprazole (PROTONIX) 40 MG tablet Take 40 mg by mouth daily.   Yes [provider]  POTASSIUM PO Take 99 mg by mouth daily as needed (for cramping (legs & feet)).   Yes [provider]  VICTOZA 18 MG/3ML SOPN Inject 1.2 mg into the skin once a week. 07/11/21  Yes [provider]     Critical care time: [place], DO Internal Medicine Resident PGY-1 Pager: [phone]

## 2021-11-12 NOTE — Progress Notes (Signed)
Neurology Progress Note  Brief HPI: Patient with a history of HTN and HLD who has been having mild confusion for the past few weeks (per grandson) was admitted on 10/3 with unresponsiveness and right sided weakness after falling out of bed.  She had had an episode of confusion the night before and had been disoriented to place. Brain MRI was negative for acute stroke, EEG has been negative for seizures and LP reveals elevated glucose (blood glucose also elevated) and elevated protein but no organisms seen on gram stain.  Patient has been febrile with positive respiratory cultures.  Subjective: Patient has been hemodynamically stable overnight and is no longer febrile.  When sedation was weaned this morning, she became agitated with whole body shaking.  No seizure activity seen on EEG.  Lactate and WBC are elevated  Exam: Vitals:   11/12/21 0715 11/12/21 0730  BP: (!) 159/78 (!) 168/73  Pulse: 70 70  Resp: 11 11  Temp: 98.6 F (37 C) 98.8 F (37.1 C)  SpO2: 98% 98%   Gen: In bed, NAD, intubated and sedated Resp: Respirations synchronous with ventilator  Neuro (sedation with propofol and fentanyl paused): Pupils reactive but sluggish, absent oculocephalic reflex, cough reflex present, unable to follow commands, does not respond to voice.  Grimaces to sternal rub.  Resists eye opening actively, will begin shaking BLE when stimulated, no movement of BUE to noxious stimuli.  Grimaces to noxious simulation in bilateral lower extremities. DTR: 2+ throughout Gait: Deferred  Pertinent Labs: CBC    Component Value Date/Time   WBC 15.9 (H) 11/12/2021 0450   RBC 3.48 (L) 11/12/2021 0450   HGB 10.1 (L) 11/12/2021 0450   HCT 29.6 (L) 11/12/2021 0450   PLT 216 11/12/2021 0450   MCV 85.1 11/12/2021 0450   MCH 29.0 11/12/2021 0450   MCHC 34.1 11/12/2021 0450   RDW 12.9 11/12/2021 0450   LYMPHSABS 2.0 11/11/2021 0525   MONOABS 0.4 11/11/2021 0525   EOSABS 0.2 11/11/2021 0525   BASOSABS 0.1  11/11/2021 0525       Latest Ref Rng & Units 11/12/2021    3:54 AM 11/12/2021   12:24 AM 11/11/2021    6:51 PM  BMP  Glucose 70 - 99 mg/dL 205  253  244   BUN 8 - 23 mg/dL '28  28  26   '$ Creatinine 0.44 - 1.00 mg/dL 1.43  1.76  1.79   Sodium 135 - 145 mmol/L 132  134  139   Potassium 3.5 - 5.1 mmol/L 5.0  3.6  3.2   Chloride 98 - 111 mmol/L 102  101  102   CO2 22 - 32 mmol/L '21  21  23   '$ Calcium 8.9 - 10.3 mg/dL 8.0  8.6  9.2    ABG    Component Value Date/Time   PHART 7.399 11/11/2021 0859   PCO2ART 47.7 11/11/2021 0859   PO2ART 365 (H) 11/11/2021 0859   HCO3 29.2 (H) 11/11/2021 0859   TCO2 31 11/11/2021 0859   O2SAT 100 11/11/2021 0859   [employer] panel negative CSF protein 75 CSF glucose 245 (with elevated blood glucose) CSF white cell count 6 CSF HSV PCR pending Autoimmune encephalitis panel pending  Imaging Reviewed: CT head: no acute abnormality CTA head and neck: no LVO or hemodynamically significant stenosis MRI brain: No acute abnormality, motion degraded  Overnight EEG: Pending  Assessment: 71 year old patient with history of HTN and HLD presents with unresponsiveness and right sided weakness which occurred after  several weeks of mild confusion and an episode of acute confusion and disorientation the night before presentation.  EEG has been negative for seizure activity, and brain imaging has been negative for acute infarct.  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  Awaiting results of autoimmune encephalitis panel.  Would not give steroids at this point due to persistent hyperglycemia requiring IV insulin.  Respiratory culture positive, lactate elevated to 2.4 and patient has been febrile.  Impression: Acute toxic metabolic encephalopathy vs. HSV encephalitis vs autoimmune encephalitis Other differential to consider given persistent nausea and vomiting is Wernicke's  Recommendations: 1)Continue IV acyclovir till the HSV  PCR comes back negative.  There is some question of HSV PCR on the [employer] panel not being very reliable and given her condition with elevated CSF protein, and 6 WBCs, it is reasonable to keep HSV encephalitis in the differentials. 2)treatment of pneumonia per primary team 3) Await results of HSV PCR and autoimmune encephalitis panel 4) ventilator and supportive care per primary team as you are 5) overnight EEG read pending.  Irrespective, I would keep her on another day of LTM until there is some more meaningful information from the exam. 6) high-dose thiamine replenishment 7) check B12 levels.  No point in checking B1 levels as she is already being supplemented Plan was discussed with attending Dr. Shearon Stalls, and resident physician Dr. Posey Pronto at the patient's bedside.  [place] , MSN, AGACNP-BC [facility] See Amion for schedule and pager information 11/12/2021 8:13 AM    Attending Neurohospitalist Addendum Patient seen and examined with APP/Resident. Agree with the history and physical as documented above. Agree with the plan as documented, which I helped formulate. I have independently reviewed the chart, obtained history, review of systems and examined the patient.I have personally reviewed pertinent head/neck/spine imaging (CT/MRI). Please feel free to call with any questions.  -- Amie Portland, MD Neurologist [facility] Pager: [phone]   CRITICAL CARE ATTESTATION Performed by: Amie Portland, MD Total critical care time: 45 minutes Critical care time was exclusive of separately billable procedures and treating other patients and/or supervising APPs/Residents/Students Critical care was necessary to treat or prevent imminent or life-threatening deterioration due to toxic metabolic encephalopathy This patient is critically ill and at significant risk for neurological worsening and/or death and care requires constant monitoring. Critical care was  time spent personally by me on the following activities: development of treatment plan with patient and/or surrogate as well as nursing, discussions with consultants, evaluation of patient's response to treatment, examination of patient, obtaining history from patient or surrogate, ordering and performing treatments and interventions, ordering and review of laboratory studies, ordering and review of radiographic studies, pulse oximetry, re-evaluation of patient's condition, participation in multidisciplinary rounds and medical decision making of high complexity in the care of this patient.

## 2021-11-13 DIAGNOSIS — R739 Hyperglycemia, unspecified: Secondary | ICD-10-CM | POA: Diagnosis not present

## 2021-11-13 DIAGNOSIS — R4182 Altered mental status, unspecified: Secondary | ICD-10-CM | POA: Diagnosis not present

## 2021-11-13 DIAGNOSIS — G9341 Metabolic encephalopathy: Secondary | ICD-10-CM | POA: Diagnosis not present

## 2021-11-13 DIAGNOSIS — I161 Hypertensive emergency: Secondary | ICD-10-CM | POA: Diagnosis not present

## 2021-11-13 LAB — GLUCOSE, CAPILLARY
Glucose-Capillary: 155 mg/dL — ABNORMAL HIGH (ref 70–99)
Glucose-Capillary: 171 mg/dL — ABNORMAL HIGH (ref 70–99)
Glucose-Capillary: 194 mg/dL — ABNORMAL HIGH (ref 70–99)
Glucose-Capillary: 193 mg/dL — ABNORMAL HIGH (ref 70–99)
Glucose-Capillary: 203 mg/dL — ABNORMAL HIGH (ref 70–99)
Glucose-Capillary: 220 mg/dL — ABNORMAL HIGH (ref 70–99)
Glucose-Capillary: 227 mg/dL — ABNORMAL HIGH (ref 70–99)
Glucose-Capillary: 205 mg/dL — ABNORMAL HIGH (ref 70–99)
Glucose-Capillary: 186 mg/dL — ABNORMAL HIGH (ref 70–99)
Glucose-Capillary: 166 mg/dL — ABNORMAL HIGH (ref 70–99)
Glucose-Capillary: 156 mg/dL — ABNORMAL HIGH (ref 70–99)
Glucose-Capillary: 141 mg/dL — ABNORMAL HIGH (ref 70–99)
Glucose-Capillary: 154 mg/dL — ABNORMAL HIGH (ref 70–99)
Glucose-Capillary: 223 mg/dL — ABNORMAL HIGH (ref 70–99)
Glucose-Capillary: 233 mg/dL — ABNORMAL HIGH (ref 70–99)
Glucose-Capillary: 226 mg/dL — ABNORMAL HIGH (ref 70–99)

## 2021-11-13 LAB — CBC WITH DIFFERENTIAL/PLATELET
Abs Immature Granulocytes: 0.08 10*3/uL — ABNORMAL HIGH (ref 0.00–0.07)
Basophils Absolute: 0 10*3/uL (ref 0.0–0.1)
Basophils Relative: 0 %
Eosinophils Absolute: 0 10*3/uL (ref 0.0–0.5)
Eosinophils Relative: 0 %
HCT: 27.1 % — ABNORMAL LOW (ref 36.0–46.0)
Hemoglobin: 9.5 g/dL — ABNORMAL LOW (ref 12.0–15.0)
Immature Granulocytes: 1 %
Lymphocytes Relative: 17 %
Lymphs Abs: 2.2 10*3/uL (ref 0.7–4.0)
MCH: 30.4 pg (ref 26.0–34.0)
MCHC: 35.1 g/dL (ref 30.0–36.0)
MCV: 86.6 fL (ref 80.0–100.0)
Monocytes Absolute: 0.6 10*3/uL (ref 0.1–1.0)
Monocytes Relative: 5 %
Neutro Abs: 9.8 10*3/uL — ABNORMAL HIGH (ref 1.7–7.7)
Neutrophils Relative %: 77 %
Platelets: 195 10*3/uL (ref 150–400)
RBC: 3.13 MIL/uL — ABNORMAL LOW (ref 3.87–5.11)
RDW: 13 % (ref 11.5–15.5)
WBC: 12.8 10*3/uL — ABNORMAL HIGH (ref 4.0–10.5)
nRBC: 0 % (ref 0.0–0.2)

## 2021-11-13 LAB — BASIC METABOLIC PANEL
Anion gap: 7 (ref 5–15)
Anion gap: 11 (ref 5–15)
BUN: 35 mg/dL — ABNORMAL HIGH (ref 8–23)
BUN: 23 mg/dL (ref 8–23)
CO2: 23 mmol/L (ref 22–32)
CO2: 23 mmol/L (ref 22–32)
Calcium: 7.5 mg/dL — ABNORMAL LOW (ref 8.9–10.3)
Calcium: 8.8 mg/dL — ABNORMAL LOW (ref 8.9–10.3)
Chloride: 102 mmol/L (ref 98–111)
Chloride: 107 mmol/L (ref 98–111)
Creatinine, Ser: 1.34 mg/dL — ABNORMAL HIGH (ref 0.44–1.00)
Creatinine, Ser: 1.07 mg/dL — ABNORMAL HIGH (ref 0.44–1.00)
GFR, Estimated: 43 mL/min — ABNORMAL LOW (ref >60)
GFR, Estimated: 57 mL/min — ABNORMAL LOW (ref >60)
Glucose, Bld: 225 mg/dL — ABNORMAL HIGH (ref 70–99)
Glucose, Bld: 234 mg/dL — ABNORMAL HIGH (ref 70–99)
Potassium: 3.1 mmol/L — ABNORMAL LOW (ref 3.5–5.1)
Potassium: 4.1 mmol/L (ref 3.5–5.1)
Sodium: 132 mmol/L — ABNORMAL LOW (ref 135–145)
Sodium: 141 mmol/L (ref 135–145)

## 2021-11-13 LAB — VDRL, CSF: VDRL Quant, CSF: NONREACTIVE

## 2021-11-13 LAB — CULTURE, RESPIRATORY W GRAM STAIN: Culture: NORMAL

## 2021-11-13 LAB — HSV 1/2 PCR, CSF
HSV-1 DNA: NEGATIVE
HSV-2 DNA: NEGATIVE

## 2021-11-13 LAB — VZV PCR, CSF: VZV PCR, CSF: NEGATIVE

## 2021-11-13 LAB — CYTOLOGY - NON PAP

## 2021-11-13 MED ORDER — ATORVASTATIN CALCIUM 40 MG PO TABS
40.0000 mg | ORAL_TABLET | Freq: Every day | ORAL | Status: DC
  Administered 2021-11-14 – 2021-11-18 (×5): 40 mg via ORAL
  Filled 2021-11-13 (×5): qty 1

## 2021-11-13 MED ORDER — ONDANSETRON HCL 4 MG/2ML IJ SOLN
4.0000 mg | Freq: Four times a day (QID) | INTRAMUSCULAR | Status: DC | PRN
  Administered 2021-11-13: 4 mg via INTRAVENOUS
  Filled 2021-11-13: qty 2

## 2021-11-13 MED ORDER — PANTOPRAZOLE SODIUM 40 MG PO TBEC
40.0000 mg | DELAYED_RELEASE_TABLET | Freq: Every day | ORAL | Status: DC
  Administered 2021-11-14 – 2021-11-18 (×5): 40 mg via ORAL
  Filled 2021-11-13 (×5): qty 1

## 2021-11-13 MED ORDER — POTASSIUM CHLORIDE 20 MEQ PO PACK
40.0000 meq | PACK | Freq: Two times a day (BID) | ORAL | Status: AC
  Administered 2021-11-13: 40 meq via ORAL
  Filled 2021-11-13: qty 2

## 2021-11-13 MED ORDER — ACETAMINOPHEN 325 MG PO TABS: 650.0000 mg | ORAL_TABLET | ORAL | Status: DC | PRN

## 2021-11-13 MED ORDER — AMLODIPINE BESYLATE 10 MG PO TABS
10.0000 mg | ORAL_TABLET | Freq: Every day | ORAL | Status: DC
  Administered 2021-11-14 – 2021-11-18 (×5): 10 mg via ORAL
  Filled 2021-11-13 (×5): qty 1

## 2021-11-13 MED ORDER — POTASSIUM CHLORIDE 20 MEQ PO PACK
40.0000 meq | PACK | Freq: Two times a day (BID) | ORAL | Status: DC
  Administered 2021-11-13: 40 meq
  Filled 2021-11-13: qty 2

## 2021-11-13 MED ORDER — ASPIRIN 81 MG PO CHEW
81.0000 mg | CHEWABLE_TABLET | Freq: Every day | ORAL | Status: DC
  Administered 2021-11-14 – 2021-11-18 (×5): 81 mg via ORAL
  Filled 2021-11-13 (×5): qty 1

## 2021-11-13 MED ORDER — HYDRALAZINE HCL 20 MG/ML IJ SOLN
10.0000 mg | INTRAMUSCULAR | Status: DC | PRN
  Administered 2021-11-13 – 2021-11-18 (×10): 10 mg via INTRAVENOUS
  Filled 2021-11-13 (×10): qty 1

## 2021-11-13 MED ORDER — HYDROCHLOROTHIAZIDE 25 MG PO TABS
25.0000 mg | ORAL_TABLET | Freq: Every day | ORAL | Status: DC
  Administered 2021-11-13 – 2021-11-18 (×6): 25 mg via ORAL
  Filled 2021-11-13 (×6): qty 1

## 2021-11-13 MED ORDER — INSULIN ASPART 100 UNIT/ML IJ SOLN
0.0000 [IU] | Freq: Three times a day (TID) | INTRAMUSCULAR | Status: DC
  Administered 2021-11-13: 2 [IU] via SUBCUTANEOUS

## 2021-11-13 MED ORDER — ONDANSETRON HCL 4 MG PO TABS: 4.0000 mg | ORAL_TABLET | Freq: Three times a day (TID) | ORAL | Status: DC | PRN

## 2021-11-13 MED ORDER — DOCUSATE SODIUM 100 MG PO CAPS
100.0000 mg | ORAL_CAPSULE | Freq: Two times a day (BID) | ORAL | Status: DC
  Administered 2021-11-13 – 2021-11-18 (×10): 100 mg via ORAL
  Filled 2021-11-13 (×10): qty 1

## 2021-11-13 MED ORDER — ORAL CARE MOUTH RINSE: 15.0000 mL | OROMUCOSAL | Status: DC | PRN

## 2021-11-13 MED ORDER — INSULIN ASPART 100 UNIT/ML IJ SOLN
0.0000 [IU] | Freq: Every day | INTRAMUSCULAR | Status: DC
  Administered 2021-11-13: 2 [IU] via SUBCUTANEOUS

## 2021-11-13 NOTE — Progress Notes (Signed)
Neurology Progress Note  Brief HPI: Patient with a history of HTN and HLD who has been having mild confusion for the past few weeks (per grandson) was admitted on 10/3 with unresponsiveness and right sided weakness after falling out of bed.  She had had an episode of confusion the night before and had been disoriented to place. Brain MRI was negative for acute stroke, EEG has been negative for seizures and LP reveals elevated glucose (blood glucose also elevated) and elevated protein but no organisms seen on gram stain.  Patient has been febrile with positive respiratory cultures.  Subjective: Patient continues to be hemodynamically stable and afebrile.  Her sedation has been weaned down, and she is able to track the examiner and can intermittently follow commands this morning.  Awaiting read on LTM EEG, will discontinue if no seizure activity seen.  Exam: Vitals:   11/13/21 0715 11/13/21 0800  BP:  (!) 173/74  Pulse: (!) 46 87  Resp: 20 19  Temp: 98.2 F (36.8 C) 98.4 F (36.9 C)  SpO2: 100% 96%   Gen: In bed, NAD, intubated and sedated Resp: Respirations synchronous with ventilator  Neurological exam off sedation She is awake, she is holding onto her grandson's hand.  He is able to ask her to let it go and she does let it go but then holds his other hand.  She appears anxious and worried to let go of his hand.  She is able to track the examiner. She is nonverbal Cranial nerves: Pupils equal round reactive light, extraocular movements are difficult to examine because she does not track the examiner's finger but she is able to turn to both sides and looks on both side spontaneously, does not blink to threat consistently from each side, facial symmetry difficult to ascertain due to the tube. Motor examination: She is moving both upper extremities strongly but not consistently to command. She does not move her lower extremities to command or spontaneously at all. Sensory: Grimaces to noxious  simulation in all fours Difficult to assess coordination and gait given her intubated status   Current Facility-Administered Medications:    acetaminophen (TYLENOL) tablet 650 mg, 650 mg, Per Tube, Q4H PRN, Spero Geralds, MD, 650 mg at 11/12/21 1230   acyclovir (ZOVIRAX) 755 mg in dextrose 5 % 250 mL IVPB, 10 mg/kg (Adjusted), Intravenous, Q12H, Amie Portland, MD, Last Rate: 265.1 mL/hr at 11/13/21 0837, 755 mg at 11/13/21 0837   amLODipine (NORVASC) tablet 10 mg, 10 mg, Per Tube, Daily, Chand, Sudham, MD, 10 mg at 11/13/21 0854   aspirin chewable tablet 81 mg, 81 mg, Per Tube, Daily, Chand, Sudham, MD, 81 mg at 11/13/21 0854   atorvastatin (LIPITOR) tablet 40 mg, 40 mg, Per Tube, Daily, Chand, Sudham, MD, 40 mg at 11/13/21 0854   Chlorhexidine Gluconate Cloth 2 % PADS 6 each, 6 each, Topical, Daily, Chand, Currie Paris, MD, 6 each at 11/13/21 0837   dextrose 50 % solution 0-50 mL, 0-50 mL, Intravenous, PRN, Rollene Rotunda, John D, PA-C   docusate (COLACE) 50 MG/5ML liquid 100 mg, 100 mg, Per Tube, BID, Andres Labrum D, PA-C, 100 mg at 11/13/21 0854   feeding supplement (VITAL AF 1.2 CAL) liquid 1,000 mL, 1,000 mL, Per Tube, Continuous, Spero Geralds, MD, Last Rate: 65 mL/hr at 11/13/21 0800, Infusion Verify at 11/13/21 0800   fentaNYL (SUBLIMAZE) bolus via infusion 25-100 mcg, 25-100 mcg, Intravenous, Q15 min PRN, Gleason, Otilio Carpen, PA-C, 100 mcg at 11/13/21 0342   fentaNYL (SUBLIMAZE) injection  25 mcg, 25 mcg, Intravenous, Q15 min PRN, Andres Labrum D, PA-C   fentaNYL (SUBLIMAZE) injection 25-100 mcg, 25-100 mcg, Intravenous, Q30 min PRN, Andres Labrum D, PA-C   fentaNYL 2593mg in NS 259m(1059mml) infusion-PREMIX, 25-200 mcg/hr, Intravenous, Continuous, Gleason, LauOtilio CarpenA-C, Last Rate: 10 mL/hr at 11/13/21 0800, 100 mcg/hr at 11/13/21 0800   heparin injection 5,000 Units, 5,000 Units, Subcutaneous, Q8H, Payne, John D, PA-C, 5,000 Units at 11/13/21 0519   insulin regular, human (MYXREDLIN) 100 units/ 100 mL  infusion, , Intravenous, Continuous, PatLeigh AuroraO, Last Rate: 9.5 mL/hr at 11/13/21 0801, 9.5 Units/hr at 11/13/21 0801   lactated ringers infusion, , Intravenous, Continuous, PatLeigh AuroraO, Last Rate: 140 mL/hr at 11/13/21 0800, Infusion Verify at 11/13/21 0800   Oral care mouth rinse, 15 mL, Mouth Rinse, Q2H, Chand, SudCurrie ParisD, 15 mL at 11/13/21 0758   Oral care mouth rinse, 15 mL, Mouth Rinse, PRN, ChaJacky KindleD   pantoprazole (PROTONIX) 2 mg/mL oral suspension 40 mg, 40 mg, Per Tube, Daily, PayAndres Labrum PA-C, 40 mg at 11/13/21 0855   polyethylene glycol (MIRALAX / GLYCOLAX) packet 17 g, 17 g, Per Tube, Daily, PayAndres Labrum PA-C, 17 g at 11/13/21 0854   potassium chloride (KLOR-CON) packet 40 mEq, 40 mEq, Per Tube, BID, PatLeigh AuroraO, 40 mEq at 11/13/21 0854   propofol (DIPRIVAN) 1000 MG/100ML infusion, 0-50 mcg/kg/min, Intravenous, Continuous, PayAndres Labrum PA-C, Last Rate: 5.72 mL/hr at 11/13/21 0800, 10 mcg/kg/min at 11/13/21 0800   sodium chloride flush (NS) 0.9 % injection 10-40 mL, 10-40 mL, Intracatheter, Q12H, Chand, SudCurrie ParisD, 10 mL at 11/13/21 0142   sodium chloride flush (NS) 0.9 % injection 10-40 mL, 10-40 mL, Intracatheter, PRN, ChaJacky KindleD   sodium chloride flush (NS) 0.9 % injection 3 mL, 3 mL, Intravenous, Once, Pollina, ChrGwenyth AllegraD   thiamine (VITAMIN B1) 500 mg in normal saline (50 mL) IVPB, 500 mg, Intravenous, TID, Stopped at 11/12/21 2233 **FOLLOWED BY** [START ON 11/15/2021] thiamine (VITAMIN B1) 250 mg in sodium chloride 0.9 % 50 mL IVPB, 250 mg, Intravenous, Daily, PatLeigh AuroraO   Pertinent Labs: CBC    Component Value Date/Time   WBC 12.8 (H) 11/13/2021 0407   RBC 3.13 (L) 11/13/2021 0407   HGB 9.5 (L) 11/13/2021 0407   HCT 27.1 (L) 11/13/2021 0407   PLT 195 11/13/2021 0407   MCV 86.6 11/13/2021 0407   MCH 30.4 11/13/2021 0407   MCHC 35.1 11/13/2021 0407   RDW 13.0 11/13/2021 0407   LYMPHSABS 2.2 11/13/2021 0407   MONOABS 0.6  11/13/2021 0407   EOSABS 0.0 11/13/2021 0407   BASOSABS 0.0 11/13/2021 0407       Latest Ref Rng & Units 11/13/2021    4:07 AM 11/12/2021   12:34 PM 11/12/2021    8:19 AM  BMP  Glucose 70 - 99 mg/dL 225  282  173   BUN 8 - 23 mg/dL 35  30  28   Creatinine 0.44 - 1.00 mg/dL 1.34  1.31  1.34   Sodium 135 - 145 mmol/L 132  132  136   Potassium 3.5 - 5.1 mmol/L 3.1  3.5  4.1   Chloride 98 - 111 mmol/L 102  103  102   CO2 22 - 32 mmol/L '23  21  22   '$ Calcium 8.9 - 10.3 mg/dL 7.5  7.7  8.4    ABG    Component Value Date/Time   PHART 7.399 11/11/2021 0859  PCO2ART 47.7 11/11/2021 0859   PO2ART 365 (H) 11/11/2021 0859   HCO3 29.2 (H) 11/11/2021 0859   TCO2 31 11/11/2021 0859   O2SAT 100 11/11/2021 0859   Biofire panel negative CSF protein 75 CSF glucose 245 (with elevated blood glucose) CSF white cell count 6 CSF HSV PCR pending Autoimmune encephalitis panel pending  Imaging Reviewed: CT head: no acute abnormality CTA head and neck: no LVO or hemodynamically significant stenosis MRI brain: No acute abnormality, motion degraded  Overnight EEG: Pending  Assessment: 71 year old patient with history of HTN and HLD presents with unresponsiveness and right sided weakness which occurred after several weeks of mild confusion and an episode of acute confusion and disorientation the night before presentation.  EEG has been negative for seizure activity, and brain imaging has been negative for acute infarct.  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  Awaiting results of autoimmune encephalitis panel.  Would not give steroids at this point due to persistent hyperglycemia requiring IV insulin.  Respiratory culture positive, lactate elevated to 2.4 and patient has been febrile.  Patient is improved this morning with less sedation, able to intermittently follow commands.  Impression: Acute toxic metabolic encephalopathy due to hyperglycemia vs. HSV  encephalitis vs autoimmune encephalitis Other differential to consider given persistent nausea and vomiting is Wernicke's  Recommendations: -Continue IV acyclovir till the HSV PCR comes back negative.  There is some question of HSV PCR on the BioFire panel not being very reliable and given her condition with elevated CSF protein, and 6 WBCs, it is reasonable to keep HSV encephalitis in the differentials. -treatment of pneumonia per primary team -ventilator and supportive care per primary team as you are -overnight EEG read pending. Will discontinue LTM EEG today if no seizures seen. -high-dose thiamine replenishment -check B12 levels.  No point in checking B1 levels as she is already being supplemented -continue to attempt to reduce sedation and weaning trials per [place] , MSN, AGACNP-BC [facility] See Amion for schedule and pager information 11/13/2021 8:18 AM    Attending Neurohospitalist Addendum Patient seen and examined with APP/Resident. Agree with the history and physical as documented above. Agree with the plan as documented, which I helped formulate. I have independently reviewed the chart, obtained history, review of systems and examined the patient.I have personally reviewed pertinent head/neck/spine imaging (CT/MRI). Please feel free to call with any questions.  -- Amie Portland, MD Neurologist [facility] Pager: [phone]

## 2021-11-13 NOTE — Progress Notes (Signed)
LTM maintenance CZ, C3, F3, fixed. No skin breakdown at skin sites.

## 2021-11-13 NOTE — Progress Notes (Signed)
LTM EEG Disconnect, no skin breakdown at DC.

## 2021-11-13 NOTE — Progress Notes (Signed)
NAME:  Victoria Jimenez, MRN:  [id number], DOB:  Jul 04, 1950, LOS: 2 ADMISSION DATE:  11/11/2021, CONSULTATION DATE:  11/11/2021 REFERRING MD:  EDP, CHIEF COMPLAINT:  Altered Mental Status    History of Present Illness:  This is a 71 year old female with a past medical history of HSV, hyperlipidemia, CAD, hyperlipidemia who presented to the emergency room febrile, hypertensive, altered mental status, requiring intubation and sedation.  Patient was found to be in DKA, with severe symptomatic hypertension, as well as acute metabolic encephalopathy, and admitted for further work-up.  Pertinent  Medical History  HSV, CAD, hyperlipidemia, hypertension  Significant Hospital Events: Including procedures, antibiotic start and stop dates in addition to other pertinent events   10/03: Intubated and admit to ICU 10/05: Patient extubated   Interim History / Subjective:  Patient is more awake on the exam this morning. Patient is nonverbal.  Patient is able to track me around the room.  Patient turns her head to voice. Objective   Blood pressure (!) 114/49, pulse (!) 46, temperature 98.4 F (36.9 C), resp. rate 20, height '5\' 6"'$  (1.676 m), weight 103.1 kg, SpO2 99 %.    Vent Mode: PRVC FiO2 (%):  [40 %] 40 % Set Rate:  [20 bmp] 20 bmp Vt Set:  [470 mL] 470 mL PEEP:  [5 cmH20] 5 cmH20 Plateau Pressure:  [24 cmH20] 24 cmH20   Intake/Output Summary (Last 24 hours) at 11/13/2021 0713 Last data filed at 11/13/2021 0600 Gross per 24 hour  Intake 4876.37 ml  Output 1047 ml  Net 3829.37 ml   Filed Weights   11/11/21 0717 11/12/21 0515 11/13/21 0600  Weight: 95.3 kg 99.6 kg 103.1 kg    Examination: General: Patient is extubated on my exam.  Patient is on nasal cannula. Eyes: Patient is blinking spontaneously with no injected conjunctiva  Head: Normocephalic, atraumatic  Cardio: Regular rate and rhythm, no murmur, rubs, or gallops. Pulmonary: Clear to ausculation bilaterally with no rales,  rhonchi, and crackles  Abdomen: Soft, with no distention noted Neuro: Patient is alert and slightly oriented.  Patient is following commands.  Patient is blinking spontaneously.  Patient turns head to voice.  Patient is able to track around the room. Skin: No rashes noted   Resolved Hospital Problem list   Acute respiratory failure secondary to acute metabolic encephalopathy DKA Severe symptomatic hypertension  Assessment & Plan:  This is a 72 year old female with a past medical history of HSV, hyperlipidemia, CAD, hyperlipidemia who presented to the emergency room febrile, hypertensive, altered mental status, requiring intubation and sedation.  Patient was found to be in DKA, with severe symptomatic hypertension, as well as acute metabolic encephalopathy, and admitted for further work-up.  #Acute metabolic encephalopathy, resolving #Acute respiratory failure secondary to acute metabolic encephalopathy, resolved Patient extubated today.  Patient is now awake and alert.  Patient is able to maintain her own airway.  Still waiting on HSV PCR.  Still waiting for autoimmune encephalitis panel.  Patient has been afebrile for 24 hours now.  Hypertension is resolving, lactic acidosis is resolving, ketoacidosis is resolved.  Continue to monitor for neurological function as she continues to awake. -Continue frequent neurochecks -HSV PCR pending -Wean oxygen as tolerated with goal oxygen of greater than 92% -Frequent reorientation to avoid delirium  #DKA, resolved #Type 2 diabetes mellitus Patient's A1c 7.4.  Patient remains on insulin drip.  Gap is now closed.  Glucose measuring in the low 200s.  We will continue on insulin drip for now given  variable insulin requirements.  We will transition when patient can tolerate oral feeds.  -Frequent CBGs -Trend BMP -Insulin drip, transition to basal bolus as patient tolerates oral feeds  #Primary hypertension Patient is now off of Cardene drip.  Patient is  taking amlodipine 10 mg daily.  Home HCTZ is still held.  Blood pressure has gone down into the 110s to 130s.  Upon extubation, patient's blood pressure was slightly elevated.  This could be due to agitation.  We will continue to monitor. -Continue to monitor blood pressure closely -Monitor for signs of hypertension -Hold home HCTZ given AKI, consider resuming as creatinine function improves  #Prerenal acute kidney injury Patient's creatinine 1.34 today.  Decreasing.  This is likely a prerenal cause, and is resolving with fluids.  Patient has good urine output. -Continue to monitor creatinine -Continue maintenance fluid  #CAD -Continue home atorvastatin 40 mg daily -Continue home aspirin 81 mg daily  #Diabetic neuropathy -Consider resuming home gabapentin as patient becomes more alert and oriented  Best Practice (right click and "Reselect all SmartList Selections" daily)   Diet/type: tubefeeds DVT prophylaxis: prophylactic heparin  GI prophylaxis: PPI Lines: Right PICC Foley:  Yes, and it is still needed Code Status:  full code  Labs   CBC: Recent Labs  Lab 11/11/21 0525 11/11/21 0800 11/11/21 0859 11/12/21 0450 11/13/21 0407  WBC 9.9 11.0*  --  15.9* 12.8*  NEUTROABS 7.2  --   --   --  9.8*  HGB 13.7  13.9 14.7 12.6 10.1* 9.5*  HCT 42.0  41.0 42.0 37.0 29.6* 27.1*  MCV 88.2 81.7  --  85.1 86.6  PLT 263 292  --  216 846    Basic Metabolic Panel: Recent Labs  Lab 11/11/21 0759 11/11/21 0800 11/11/21 1529 11/11/21 1851 11/12/21 0024 11/12/21 0354 11/12/21 0450 11/12/21 0819 11/12/21 1234  NA  --    < > 137 139 134* 132*  --  136 132*  K  --    < > 3.3* 3.2* 3.6 5.0  --  4.1 3.5  CL  --    < > 100 102 101 102  --  102 103  CO2  --    < > 20* 23 21* 21*  --  22 21*  GLUCOSE  --    < > 386* 244* 253* 205*  --  173* 282*  BUN  --    < > 21 26* 28* 28*  --  28* 30*  CREATININE  --    < > 1.68* 1.79* 1.76* 1.43*  --  1.34* 1.31*  CALCIUM  --    < > 9.1 9.2 8.6*  8.0*  --  8.4* 7.7*  MG 1.7  --  1.3*  --   --   --  2.2 2.4  --   PHOS  --   --  1.3*  --   --   --  3.9 4.6  --    < > = values in this interval not displayed.   GFR: Estimated Creatinine Clearance: 49.8 mL/min (A) (by C-G formula based on SCr of 1.31 mg/dL (H)). Recent Labs  Lab 11/11/21 0525 11/11/21 0759 11/11/21 0800 11/11/21 1033 11/11/21 1528 11/12/21 0354 11/12/21 0450 11/13/21 0407  PROCALCITON  --  <0.10  --   --   --   --   --   --   WBC 9.9  --  11.0*  --   --   --  15.9* 12.8*  LATICACIDVEN  --   --  3.0* 5.9* 7.7* 2.6*  --   --     Liver Function Tests: Recent Labs  Lab 11/11/21 0525  AST 21  ALT 18  ALKPHOS 94  BILITOT 0.6  PROT 7.8  ALBUMIN 4.1   No results for input(s): "LIPASE", "AMYLASE" in the last 168 hours. Recent Labs  Lab 11/11/21 0800  AMMONIA 15    ABG    Component Value Date/Time   PHART 7.399 11/11/2021 0859   PCO2ART 47.7 11/11/2021 0859   PO2ART 365 (H) 11/11/2021 0859   HCO3 29.2 (H) 11/11/2021 0859   TCO2 31 11/11/2021 0859   O2SAT 100 11/11/2021 0859     Coagulation Profile: Recent Labs  Lab 11/11/21 0759  INR 1.1    Cardiac Enzymes: Recent Labs  Lab 11/11/21 1429  CKTOTAL 86    HbA1C: Hgb A1c MFr Bld  Date/Time Value Ref Range Status  11/11/2021 08:00 AM 7.4 (H) 4.8 - 5.6 % Final    Comment:    (NOTE) Pre diabetes:          5.7%-6.4%  Diabetes:              >6.4%  Glycemic control for   <7.0% adults with diabetes   09/03/2015 03:47 AM 6.4 (H) 4.8 - 5.6 % Final    Comment:    (NOTE)         Pre-diabetes: 5.7 - 6.4         Diabetes: >6.4         Glycemic control for adults with diabetes: <7.0     CBG: Recent Labs  Lab 11/12/21 2356 11/13/21 0159 11/13/21 0357 11/13/21 0558 11/13/21 0705  GLUCAP 169* 155* 171* 194* 193*    Review of Systems:   Negative except for what is stated in the HPI   Past Medical History:  She,  has a past medical history of Allergic rhinitis, Coronary artery  disease, Diabetes mellitus without complication ([place]), GERD (gastroesophageal reflux disease), Herpes simplex, Hyperlipidemia, Hypertension, OSA (obstructive sleep apnea), and Sleep apnea.   Surgical History:   Past Surgical History:  Procedure Laterality Date   ABDOMINAL HYSTERECTOMY     ANGIOPLASTY     CARDIAC CATHETERIZATION     TUBAL LIGATION       Social History:   reports that she has never smoked. She has never used smokeless tobacco. She reports current alcohol use. She reports that she does not use drugs.   Family History:  Her family history includes Coronary artery disease in her father; Diabetes in her brother, maternal grandmother, and mother; Hypertension in her sister; Lung cancer in her mother. There is no history of Colon cancer.   Allergies No Known Allergies   Home Medications  Prior to Admission medications   Medication Sig Start Date End Date Taking? Authorizing Provider  acetaminophen (TYLENOL) 325 MG tablet Take 650 mg by mouth every 6 (six) hours as needed for mild pain, moderate pain, fever or headache.   Yes [provider]  aspirin EC 81 MG tablet Take 81 mg by mouth daily.   Yes [provider]  atorvastatin (LIPITOR) 40 MG tablet Take 40 mg by mouth daily. 07/14/21  Yes [provider]  Calcium Carbonate-Vit D-Min (CALTRATE 600+D PLUS MINERALS) 600-800 MG-UNIT TABS Take 1 tablet by mouth in the morning and at bedtime.   Yes [provider]  Cholecalciferol (VITAMIN D3) 1000 units CAPS Take 1 capsule by mouth daily.   Yes [provider]  Cranberry-Vitamin  C-Probiotic (AZO CRANBERRY PO) Take 2 capsules by mouth daily.   Yes [provider]  gabapentin (NEURONTIN) 300 MG capsule Take 600 mg by mouth 3 (three) times daily. 01/27/16  Yes [provider]  hydrochlorothiazide (HYDRODIURIL) 25 MG tablet Take 25 mg by mouth daily. 02/05/16  Yes [provider]  loratadine (CLARITIN) 10 MG tablet  Take 10 mg by mouth daily.   Yes [provider]  losartan (COZAAR) 100 MG tablet Take 100 mg by mouth daily. 07/14/21  Yes [provider]  metFORMIN (GLUCOPHAGE) 1000 MG tablet Take 1,000 mg by mouth 2 (two) times daily. 10/07/18  Yes [provider]  pantoprazole (PROTONIX) 40 MG tablet Take 40 mg by mouth daily.   Yes [provider]  POTASSIUM PO Take 99 mg by mouth daily as needed (for cramping (legs & feet)).   Yes [provider]  VICTOZA 18 MG/3ML SOPN Inject 1.2 mg into the skin once a week. 07/11/21  Yes [provider]     Critical care time: [place], DO Internal Medicine Resident PGY-1 Pager: [phone]

## 2021-11-13 NOTE — Progress Notes (Addendum)
Inpatient Diabetes Program Recommendations  AACE/ADA: New Consensus Statement on Inpatient Glycemic Control (2015)  Target Ranges:  Prepandial:   less than 140 mg/dL      Peak postprandial:   less than 180 mg/dL (1-2 hours)      Critically ill patients:  140 - 180 mg/dL   Lab Results  Component Value Date   GLUCAP 220 (H) 11/13/2021   HGBA1C 7.4 (H) 11/11/2021    Review of Glycemic Control  Latest Reference Range & Units 11/13/21 01:59 11/13/21 03:57 11/13/21 05:58 11/13/21 07:05 11/13/21 07:58 11/13/21 08:53  Glucose-Capillary 70 - 99 mg/dL 155 (H) 171 (H) 194 (H) 193 (H) 203 (H) 220 (H)  Diabetes history: DM 2 Outpatient Diabetes medications:  Metformin 1000 mg bid Current orders for Inpatient glycemic control:  IV insulin  Inpatient Diabetes Program Recommendations:    Note that patient extubated.  Will follow for insulin needs/transition.   Thanks,  Adah Perl, RN, BC-ADM Inpatient Diabetes Coordinator Pager [phone]  (8a-5p)

## 2021-11-13 NOTE — Procedures (Addendum)
Patient Name: KATRIEL CUTSFORTH  MRN: [id number]  Epilepsy Attending: Lora Havens  Referring Physician/Provider: Greta Doom, MD  Duration: 11/12/2021 7209 to 11/13/2021 4709   Patient history:  71 y.o. female with a history of hypertension, hyperlipidemia who is normal when she went to bed last night around 10 PM. EEG to evaluate for seizure   Level of alertness: lethargic   AEDs during EEG study: None   Technical aspects: This EEG study was done with scalp electrodes positioned according to the 10-20 International system of electrode placement. Electrical activity was reviewed with band pass filter of 1-'70Hz'$ , sensitivity of 7 uV/mm, display speed of 78m/sec with a '60Hz'$  notched filter applied as appropriate. EEG data were recorded continuously and digitally stored.  Video monitoring was available and reviewed as appropriate.   Description: EEG showed near continuous 2 to 3 Hz delta slowing with overriding 12 to 15 Hz with activity in left hemisphere.  There was 2 to 3 Hz delta slowing in right hemisphere without any overriding fast activity.  Hyperventilation and photic stimulation were not performed.     EKG artifact was seen throughout the study.  Also, parts of study were difficult to interpret due to significant myogenic artifact.   ABNORMALITY -Continuous slow, generalized and lateralized right hemisphere  IMPRESSION: This study is suggestive of cortical dysfunction in right hemisphere likely secondary to underlying structural abnormality.  Additionally there is moderate to severe diffuse encephalopathy. No seizures or epileptiform discharges were seen throughout the recording.   Cash Duce OBarbra Sarks

## 2021-11-13 NOTE — Procedures (Signed)
Extubation Procedure Note  Patient Details:   Name: Victoria Jimenez DOB: May 10, 1950 MRN: [id number]   Airway Documentation:    Vent end date: (not recorded) Vent end time: (not recorded)   Evaluation  O2 sats: stable throughout Complications: No apparent complications Patient did tolerate procedure well. Bilateral Breath Sounds: Clear, Diminished   Yes  Patient extubated to [place] 3L at 1010 without issue. RN present at bedside.No signs of respiratory distress noted and no evidence of stridor. Patient alert and following commands.  [name] 11/13/2021, 12:12 PM

## 2021-11-13 NOTE — Progress Notes (Addendum)
MD made aware of multiple BP readings in 180's. See new orders

## 2021-11-13 NOTE — Procedures (Addendum)
Patient Name: Victoria Jimenez  MRN: [id number]  Epilepsy Attending: Lora Havens  Referring Physician/Provider: Greta Doom, MD  Duration: 11/13/2021 5625 to 11/13/2021  1033   Patient history:  71 y.o. female with a history of hypertension, hyperlipidemia who is normal when she went to bed last night around 10 PM. EEG to evaluate for seizure   Level of alertness: lethargic   AEDs during EEG study: None   Technical aspects: This EEG study was done with scalp electrodes positioned according to the 10-20 International system of electrode placement. Electrical activity was reviewed with band pass filter of 1-'70Hz'$ , sensitivity of 7 uV/mm, display speed of 61m/sec with a '60Hz'$  notched filter applied as appropriate. EEG data were recorded continuously and digitally stored.  Video monitoring was available and reviewed as appropriate.   Description: EEG showed near continuous 2 to 3 Hz delta slowing with overriding 12 to 15 Hz with activity. Hyperventilation and photic stimulation were not performed.      EKG artifact was seen throughout the study.  Also, parts of study were difficult to interpret due to significant myogenic artifact.   ABNORMALITY -Continuous slow, generalized   IMPRESSION: This study is suggestive of moderate diffuse encephalopathy, non-specific etiology. No seizures or epileptiform discharges were seen throughout the recording.   Hanifah Royse OBarbra Sarks

## 2021-11-13 NOTE — Progress Notes (Signed)
[facility] Progress Note Patient Name: Victoria Jimenez DOB: 10/19/1950 MRN: [id number]   Date of Service  11/13/2021  HPI/Events of Note  N/V - Nursing reports that the patient has vomited X 2 following PO meds.    eICU Interventions  Plan: NPO. D/C Zofran PO. Zofran 4 mg IV Q 6 hours PRN N/V. Hydralazine 10 mg IV Q 6 hours PRN N/V.     Intervention Category Major Interventions: Other:  Lysle Dingwall 11/13/2021, 10:31 PM

## 2021-11-14 DIAGNOSIS — G9341 Metabolic encephalopathy: Secondary | ICD-10-CM | POA: Diagnosis not present

## 2021-11-14 LAB — CBC WITH DIFFERENTIAL/PLATELET
Abs Immature Granulocytes: 0.05 10*3/uL (ref 0.00–0.07)
Basophils Absolute: 0 10*3/uL (ref 0.0–0.1)
Basophils Relative: 0 %
Eosinophils Absolute: 0.1 10*3/uL (ref 0.0–0.5)
Eosinophils Relative: 1 %
HCT: 30.8 % — ABNORMAL LOW (ref 36.0–46.0)
Hemoglobin: 10.4 g/dL — ABNORMAL LOW (ref 12.0–15.0)
Immature Granulocytes: 1 %
Lymphocytes Relative: 17 %
Lymphs Abs: 1.7 10*3/uL (ref 0.7–4.0)
MCH: 28.6 pg (ref 26.0–34.0)
MCHC: 33.8 g/dL (ref 30.0–36.0)
MCV: 84.6 fL (ref 80.0–100.0)
Monocytes Absolute: 0.6 10*3/uL (ref 0.1–1.0)
Monocytes Relative: 6 %
Neutro Abs: 7.5 10*3/uL (ref 1.7–7.7)
Neutrophils Relative %: 75 %
Platelets: 231 10*3/uL (ref 150–400)
RBC: 3.64 MIL/uL — ABNORMAL LOW (ref 3.87–5.11)
RDW: 13.2 % (ref 11.5–15.5)
WBC: 10 10*3/uL (ref 4.0–10.5)
nRBC: 0 % (ref 0.0–0.2)

## 2021-11-14 LAB — GLUCOSE, CAPILLARY
Glucose-Capillary: 238 mg/dL — ABNORMAL HIGH (ref 70–99)
Glucose-Capillary: 222 mg/dL — ABNORMAL HIGH (ref 70–99)
Glucose-Capillary: 275 mg/dL — ABNORMAL HIGH (ref 70–99)
Glucose-Capillary: 248 mg/dL — ABNORMAL HIGH (ref 70–99)
Glucose-Capillary: 374 mg/dL — ABNORMAL HIGH (ref 70–99)
Glucose-Capillary: 235 mg/dL — ABNORMAL HIGH (ref 70–99)
Glucose-Capillary: 258 mg/dL — ABNORMAL HIGH (ref 70–99)

## 2021-11-14 LAB — COMPREHENSIVE METABOLIC PANEL
ALT: 22 U/L (ref 0–44)
AST: 28 U/L (ref 15–41)
Albumin: 3.2 g/dL — ABNORMAL LOW (ref 3.5–5.0)
Alkaline Phosphatase: 60 U/L (ref 38–126)
Anion gap: 10 (ref 5–15)
BUN: 19 mg/dL (ref 8–23)
CO2: 25 mmol/L (ref 22–32)
Calcium: 8.8 mg/dL — ABNORMAL LOW (ref 8.9–10.3)
Chloride: 103 mmol/L (ref 98–111)
Creatinine, Ser: 0.99 mg/dL (ref 0.44–1.00)
GFR, Estimated: >60 mL/min (ref >60)
Glucose, Bld: 261 mg/dL — ABNORMAL HIGH (ref 70–99)
Potassium: 4 mmol/L (ref 3.5–5.1)
Sodium: 138 mmol/L (ref 135–145)
Total Bilirubin: 0.9 mg/dL (ref 0.3–1.2)
Total Protein: 6.7 g/dL (ref 6.5–8.1)

## 2021-11-14 LAB — CSF CULTURE W GRAM STAIN: Culture: NO GROWTH

## 2021-11-14 LAB — MAGNESIUM: Magnesium: 2 mg/dL (ref 1.7–2.4)

## 2021-11-14 MED ORDER — LOSARTAN POTASSIUM 50 MG PO TABS
100.0000 mg | ORAL_TABLET | Freq: Every day | ORAL | Status: DC
  Administered 2021-11-14 – 2021-11-18 (×5): 100 mg via ORAL
  Filled 2021-11-14 (×5): qty 2

## 2021-11-14 MED ORDER — INSULIN ASPART 100 UNIT/ML IJ SOLN
0.0000 [IU] | INTRAMUSCULAR | Status: DC
  Administered 2021-11-14: 5 [IU] via SUBCUTANEOUS
  Administered 2021-11-14 (×2): 3 [IU] via SUBCUTANEOUS

## 2021-11-14 MED ORDER — BOOST / RESOURCE BREEZE PO LIQD CUSTOM
1.0000 | Freq: Three times a day (TID) | ORAL | Status: DC
  Administered 2021-11-14 – 2021-11-18 (×10): 1 via ORAL

## 2021-11-14 MED ORDER — INSULIN ASPART 100 UNIT/ML IJ SOLN
0.0000 [IU] | Freq: Three times a day (TID) | INTRAMUSCULAR | Status: DC
  Administered 2021-11-14: 5 [IU] via SUBCUTANEOUS
  Administered 2021-11-14: 15 [IU] via SUBCUTANEOUS
  Administered 2021-11-15: 20 [IU] via SUBCUTANEOUS

## 2021-11-14 MED ORDER — INSULIN DETEMIR 100 UNIT/ML ~~LOC~~ SOLN
10.0000 [IU] | Freq: Every day | SUBCUTANEOUS | Status: DC
  Administered 2021-11-14: 10 [IU] via SUBCUTANEOUS
  Filled 2021-11-14 (×2): qty 0.1

## 2021-11-14 MED ORDER — LABETALOL HCL 5 MG/ML IV SOLN
10.0000 mg | INTRAVENOUS | Status: DC | PRN
  Administered 2021-11-14 – 2021-11-18 (×7): 10 mg via INTRAVENOUS
  Filled 2021-11-14 (×7): qty 4

## 2021-11-14 MED ORDER — INSULIN ASPART 100 UNIT/ML IJ SOLN
0.0000 [IU] | Freq: Every day | INTRAMUSCULAR | Status: DC
  Administered 2021-11-14: 2 [IU] via SUBCUTANEOUS
  Administered 2021-11-14: 3 [IU] via SUBCUTANEOUS

## 2021-11-14 NOTE — Progress Notes (Signed)
[facility] Progress Note Patient Name: Victoria Jimenez DOB: Nov 15, 1950 MRN: [id number]   Date of Service  11/14/2021  HPI/Events of Note  Multiple issues: 1. Patient now NPO on AC/HS Novolog SSI. 2. Hypertension - BP = 199/68 with MAP = 104 post Hydralazine. HR = 105.  eICU Interventions  Plan: Change to Q 4 hour sensitive Novolog SSI. Add Labetalol 10 mg IV Q 2 hours PRN SBP > 160 or DBP > 100.     Intervention Category Major Interventions: Other:  Lysle Dingwall 11/14/2021, 12:59 AM

## 2021-11-14 NOTE — Progress Notes (Signed)
Nutrition Follow-up  DOCUMENTATION CODES:   Not applicable  INTERVENTION:  Monitor for diet advancement Boost Breeze po TID, each supplement provides 250 kcal and 9 grams of protein If unable to tolerate diet, recommend alternate nutrition access to meet nutrition needs  NUTRITION DIAGNOSIS:   Inadequate oral intake related to inability to eat as evidenced by NPO status.  Progressing- pt now on clear liquid diet  GOAL:   Patient will meet greater than or equal to 90% of their needs  Goal unmet  MONITOR:   Vent status, Labs, TF tolerance  REASON FOR ASSESSMENT:   Ventilator, Consult Enteral/tube feeding initiation and management  ASSESSMENT:   71 yo female admitted with acute encephalopathy, hypertensive emergency, sepsis, acute respiratory failure, hyperglycemia. PMH includes HTN, HLD, DM, CAD, OSA.  Pt was extubated 10/5. Diet advanced to full liquid. Pt experienced 2 episodes of emesis with PO meds and made NPO. Now tolerating PO meds again. Diet advanced to clear liquid. Will order nutrition supplements to enhance nutritional adequacy while on a restricted diet.  Insulin drip d/c now on SSI.   Spoke with pt's granddaughter at bedside. Her daughter was on Facetime at time of visit. She reports that her mom had been eating less and snacking more recently which she attributes to medication changes.. She believes that she started taking Ozempic the beginning of the year to aid in weight loss. She endorses weight loss but is uncertain how much she has had.   Unfortunately there is limited documentation of weight history within the last year to review.  Admit weight: 96.6 kg Current weight: 100.2 kg  Edema: non-pitting generalized  Medications: colace, SSI 0-9 units q4h, protonix, miralax IV drips: LR @ 170m/hr, thiamine  Labs: Cr 1.07, HgbA1c 7.4%, CBG's 222-275 x24 hours  UOP: 3.6L x24 hours I/O's: +9.2L since admit  NUTRITION - FOCUSED PHYSICAL  EXAM:  Flowsheet Row Most Recent Value  Orbital Region No depletion  Upper Arm Region No depletion  Thoracic and Lumbar Region No depletion  Buccal Region No depletion  Temple Region No depletion  Clavicle Bone Region No depletion  Clavicle and Acromion Bone Region No depletion  Scapular Bone Region No depletion  Dorsal Hand Unable to assess  [in mits]  Patellar Region No depletion  Anterior Thigh Region No depletion  Posterior Calf Region No depletion  Edema (RD Assessment) None  Hair Reviewed  Eyes Reviewed  Mouth Reviewed  Skin Reviewed  Nails Unable to assess      Diet Order:   Diet Order             Diet NPO time specified  Diet effective now                   EDUCATION NEEDS:   No education needs have been identified at this time  Skin:  Skin Assessment: Reviewed RN Assessment  Last BM:  10/3 type 6  Height:   Ht Readings from Last 1 Encounters:  11/11/21 '5\' 6"'$  (1.676 m)    Weight:   Wt Readings from Last 1 Encounters:  11/14/21 100.2 kg    Ideal Body Weight:  59.1 kg  BMI:  Body mass index is 35.65 kg/m.  Estimated Nutritional Needs:   Kcal:  1800-2000  Protein:  115-130 gm  Fluid:  >/= 2 L  AClayborne Dana RDN, LDN Clinical Nutrition

## 2021-11-14 NOTE — Inpatient Diabetes Management (Signed)
Inpatient Diabetes Program Recommendations  AACE/ADA: New Consensus Statement on Inpatient Glycemic Control (2015)  Target Ranges:  Prepandial:   less than 140 mg/dL      Peak postprandial:   less than 180 mg/dL (1-2 hours)      Critically ill patients:  140 - 180 mg/dL   Lab Results  Component Value Date   GLUCAP 275 (H) 11/14/2021   HGBA1C 7.4 (H) 11/11/2021    Latest Reference Range & Units 11/13/21 21:10 11/13/21 23:19 11/14/21 01:23 11/14/21 03:22 11/14/21 07:46  Glucose-Capillary 70 - 99 mg/dL 233 (H) 226 (H) 238 (H) 222 (H) 275 (H)  (H): Data is abnormally high  Inpatient Diabetes Program Recommendations:   Noted patient transitioned from IV insulin to Novolog 0-9 units q 4 hrs. Please consider: -Levemir 10 units bid (0.2 units x 100.2 kg)  Thank you, Nani Gasser. Breckon Reeves, RN, MSN, CDE  Diabetes Coordinator Inpatient Glycemic Control Team Team Pager [phone] (8am-5pm) 11/14/2021 8:09 AM

## 2021-11-14 NOTE — Evaluation (Signed)
Physical Therapy Evaluation Patient Details Name: Victoria Jimenez MRN: [id number] DOB: 06/14/1950 Today's Date: 11/14/2021  History of Present Illness  pt is a 71 y/o female admitted to the ED 10/3 with fever, HTN, AMS, stroke-like symptom and requiring intubation and sedation.  Working dx of encephalopathy of unknown etiology  at this point.  Work up continues.   Extubated 10/5  PMHx  CAD with CATH, DM, HTN, OSA  Clinical Impression  Pt admitted with/for encephalopathy and stroke-like symptoms of unknown etiology as of this evaluation.  Pt still presents as stroke-like in several ways, needing min to moderate assist of 1-2 persons for basic mobility and gait.  Pt currently limited functionally due to the problems listed. ( See problems list.)   Pt will benefit from PT to maximize function and safety in order to get ready for next venue listed below.        Recommendations for follow up therapy are one component of a multi-disciplinary discharge planning process, led by the attending physician.  Recommendations may be updated based on patient status, additional functional criteria and insurance authorization.  Follow Up Recommendations Acute inpatient rehab (3hours/day)      Assistance Recommended at Discharge Frequent or constant Supervision/Assistance  Patient can return home with the following  A little help with walking and/or transfers;A little help with bathing/dressing/bathroom;Assistance with cooking/housework;Assist for transportation;Help with stairs or ramp for entrance    Equipment Recommendations Other (comment) (TBD)  Recommendations for Other Services  Rehab consult    Functional Status Assessment Patient has had a recent decline in their functional status and demonstrates the ability to make significant improvements in function in a reasonable and predictable amount of time.     Precautions / Restrictions Precautions Precautions: Fall      Mobility  Bed  Mobility Overal bed mobility: Needs Assistance Bed Mobility: Supine to Sit, Sit to Supine     Supine to sit: Mod assist Sit to supine: Mod assist   General bed mobility comments: cues for sequencing, truncal and LE assist  depending on situations.    Transfers Overall transfer level: Needs assistance   Transfers: Sit to/from Stand Sit to Stand: Min assist           General transfer comment: stability assist  Left and posterior lean, use of LE's on theframe    Ambulation/Gait Ambulation/Gait assistance: Mod assist, +2 safety/equipment Gait Distance (Feet): 60 Feet Assistive device: Rolling walker (2 wheels) Gait Pattern/deviations: Step-to pattern, Step-through pattern   Gait velocity interpretation: <1.31 ft/sec, indicative of household ambulator   General Gait Details: mildly unstead, adducted/scissored gait, scewed to the R side of the RW, but listing L or posteriorly unless facilitated in upright posture.  Stairs            Wheelchair Mobility    Modified Rankin (Stroke Patients Only) Modified Rankin (Stroke Patients Only) Modified Rankin:  (4 if was a stroke)     Balance Overall balance assessment: Needs assistance Sitting-balance support: Bilateral upper extremity supported, Feet supported Sitting balance-Leahy Scale: Poor Sitting balance - Comments: needing UE assist or lists posteriorly   Standing balance support: Bilateral upper extremity supported, During functional activity, Reliant on assistive device for balance Standing balance-Leahy Scale: Fair Standing balance comment: reliant on both UE assist and external support                             Pertinent Vitals/Pain Pain Assessment Pain Assessment:  Faces Faces Pain Scale: No hurt Pain Intervention(s): Monitored during session    Home Living Family/patient expects to be discharged to:: Private residence Living Arrangements: Spouse/significant other Available Help at  Discharge: Family;Available PRN/intermittently;Available 24 hours/day Type of Home: House Home Access: Stairs to enter Entrance Stairs-Rails: Psychiatric nurse of Steps: 2/6   Home Layout: One level Home Equipment: Conservation officer, nature (2 wheels);Cane - single point;Shower seat (?BSC)      Prior Function Prior Level of Function : Independent/Modified Independent;Driving                     Hand Dominance        Extremity/Trunk Assessment   Upper Extremity Assessment Upper Extremity Assessment:  (L>R UE incoordination, mild weakness)    Lower Extremity Assessment Lower Extremity Assessment: Generalized weakness (L>R LE incoordination, mildly weak with scissored gait)       Communication   Communication: Receptive difficulties;Expressive difficulties  Cognition Arousal/Alertness: Awake/alert Behavior During Therapy: WFL for tasks assessed/performed, Restless Overall Cognitive Status: Impaired/Different from baseline Area of Impairment: Attention, Following commands, Safety/judgement, Awareness, Problem solving                   Current Attention Level: Sustained   Following Commands: Follows one step commands with increased time Safety/Judgement: Decreased awareness of safety, Decreased awareness of deficits Awareness: Intellectual Problem Solving: Slow processing, Difficulty sequencing          General Comments      Exercises     Assessment/Plan    PT Assessment Patient needs continued PT services  PT Problem List Decreased strength;Decreased activity tolerance;Decreased balance;Decreased mobility;Decreased coordination;Decreased cognition;Decreased knowledge of use of DME       PT Treatment Interventions DME instruction;Gait training;Functional mobility training;Therapeutic activities;Balance training;Neuromuscular re-education;Patient/family education    PT Goals (Current goals can be found in the Care Plan section)  Acute Rehab  PT Goals Patient Stated Goal: pt unable PT Goal Formulation: Patient unable to participate in goal setting Time For Goal Achievement: 11/28/21 Potential to Achieve Goals: Good    Frequency Min 3X/week     Co-evaluation               AM-PAC PT "6 Clicks" Mobility  Outcome Measure Help needed turning from your back to your side while in a flat bed without using bedrails?: A Lot Help needed moving from lying on your back to sitting on the side of a flat bed without using bedrails?: A Lot Help needed moving to and from a bed to a chair (including a wheelchair)?: A Lot Help needed standing up from a chair using your arms (e.g., wheelchair or bedside chair)?: A Lot Help needed to walk in hospital room?: Total Help needed climbing 3-5 steps with a railing? : Total 6 Click Score: 10    End of Session   Activity Tolerance: Patient tolerated treatment well Patient left: in bed;with call bell/phone within reach;with bed alarm set;with family/visitor present Nurse Communication: Mobility status PT Visit Diagnosis: Unsteadiness on feet (R26.81);Other abnormalities of gait and mobility (R26.89);Other symptoms and signs involving the nervous system (R29.898)    Time: 3536-1443 PT Time Calculation (min) (ACUTE ONLY): 29 min   Charges:   PT Evaluation $PT Eval Moderate Complexity: 1 Mod PT Treatments $Gait Training: 8-22 mins        11/14/2021  Ginger Carne., PT Acute Rehabilitation Services [phone]  (office)  Tessie Fass Victoria Jimenez 11/14/2021, 6:45 PM

## 2021-11-14 NOTE — Progress Notes (Signed)
Neurology Progress Note  Brief HPI: Patient with a history of HTN and HLD who has been having mild confusion for the past few weeks (per grandson) was admitted on 10/3 with unresponsiveness and right sided weakness after falling out of bed.  She had had an episode of confusion the night before and had been disoriented to place. Brain MRI was negative for acute stroke, EEG has been negative for seizures and LP reveals elevated glucose (blood glucose also elevated) and elevated protein but no organisms seen on gram stain.  Patient has been febrile with positive respiratory cultures.  Subjective: Patient extubated yesterday. LTM EEG discontinued. Will respond when spoken to but nonsensical answers.  Exam: Vitals:   11/14/21 0800 11/14/21 0938  BP:  (!) 189/74  Pulse: 84   Resp: 19   Temp: 100 F (37.8 C)   SpO2:     Gen: In bed, NAD, mitts on both upper extremities. Resp: non-labored breathing, no acute distress Abd: soft, nt  Neuro: Mental Status: Patient is only alert to self. She is able to track the examiner and occasionally follows commands. Speech seems mildly dysarthric, will respond appropriately when asking for name, but inappropriate response otherwise. Cranial Nerves: II: PERRL, however does not blink to threat on either side. III,IV, VI: Adduct & abducts eyes appropriately, conjugate gaze without ptosis or diploplia. VII: Facial movement is symmetric resting and smiling VIII: Hearing is intact to voice X: Palate elevates symmetrically Motor: Tone is normal. Bulk is normal. 5/5 strength was present in all four extremities without drift. Sensory: Grimaced and withdrew in lower extremities to noxious stimuli. Minimal movement in upper extremities. DTRs: 2+ and symmetric in the patellae. Plantars: Toes are downgoing bilaterally. Cerebellar: Cannot test. Gait:  Deferred.   Current Facility-Administered Medications:    acetaminophen (TYLENOL) tablet 650 mg, 650 mg,  Oral, Q4H PRN, Leigh Aurora, DO   acyclovir (ZOVIRAX) 755 mg in dextrose 5 % 250 mL IVPB, 10 mg/kg (Adjusted), Intravenous, Q12H, Amie Portland, MD, Stopped at 11/13/21 2220   amLODipine (NORVASC) tablet 10 mg, 10 mg, Oral, Daily, Posey Pronto, Amar, DO, 10 mg at 11/14/21 7096   aspirin chewable tablet 81 mg, 81 mg, Oral, Daily, Leigh Aurora, DO, 81 mg at 11/14/21 0935   atorvastatin (LIPITOR) tablet 40 mg, 40 mg, Oral, Daily, Patel, Amar, DO, 40 mg at 11/14/21 2836   Chlorhexidine Gluconate Cloth 2 % PADS 6 each, 6 each, Topical, Daily, Chand, Currie Paris, MD, 6 each at 11/13/21 0837   dextrose 50 % solution 0-50 mL, 0-50 mL, Intravenous, PRN, Andres Labrum D, PA-C   docusate sodium (COLACE) capsule 100 mg, 100 mg, Oral, BID, Patel, Amar, DO, 100 mg at 11/14/21 0939   heparin injection 5,000 Units, 5,000 Units, Subcutaneous, Q8H, Mick Sell, PA-C, 5,000 Units at 11/14/21 0533   hydrALAZINE (APRESOLINE) injection 10 mg, 10 mg, Intravenous, Q4H PRN, Anders Simmonds, MD, 10 mg at 11/14/21 0413   hydrochlorothiazide (HYDRODIURIL) tablet 25 mg, 25 mg, Oral, Daily, Patel, Amar, DO, 25 mg at 11/14/21 0938   insulin aspart (novoLOG) injection 0-9 Units, 0-9 Units, Subcutaneous, Q4H, Anders Simmonds, MD, 5 Units at 11/14/21 0807   labetalol (NORMODYNE) injection 10 mg, 10 mg, Intravenous, Q2H PRN, Anders Simmonds, MD, 10 mg at 11/14/21 0622   lactated ringers infusion, , Intravenous, Continuous, Leigh Aurora, DO, Last Rate: 140 mL/hr at 11/14/21 0800, Infusion Verify at 11/14/21 0800   losartan (COZAAR) tablet 100 mg, 100 mg, Oral, Daily, Patel, Amar, DO, 100  mg at 11/14/21 0935   ondansetron (ZOFRAN) injection 4 mg, 4 mg, Intravenous, Q6H PRN, Anders Simmonds, MD, 4 mg at 11/13/21 2239   Oral care mouth rinse, 15 mL, Mouth Rinse, PRN, Spero Geralds, MD   pantoprazole (PROTONIX) EC tablet 40 mg, 40 mg, Oral, Daily, Leigh Aurora, DO, 40 mg at 11/14/21 0938   polyethylene glycol (MIRALAX / GLYCOLAX) packet 17 g, 17  g, Per Tube, Daily, Mick Sell, PA-C, 17 g at 11/13/21 0854   sodium chloride flush (NS) 0.9 % injection 10-40 mL, 10-40 mL, Intracatheter, Q12H, Chand, Currie Paris, MD, 10 mL at 11/13/21 2135   sodium chloride flush (NS) 0.9 % injection 10-40 mL, 10-40 mL, Intracatheter, PRN, Jacky Kindle, MD   sodium chloride flush (NS) 0.9 % injection 3 mL, 3 mL, Intravenous, Once, Pollina, Gwenyth Allegra, MD   thiamine (VITAMIN B1) 500 mg in normal saline (50 mL) IVPB, 500 mg, Intravenous, TID, Last Rate: 100 mL/hr at 11/14/21 0934, 500 mg at 11/14/21 0934 **FOLLOWED BY** [START ON 11/15/2021] thiamine (VITAMIN B1) 250 mg in sodium chloride 0.9 % 50 mL IVPB, 250 mg, Intravenous, Daily, Leigh Aurora, DO   Pertinent Labs: CBC    Component Value Date/Time   WBC 10.0 11/14/2021 0709   RBC 3.64 (L) 11/14/2021 0709   HGB 10.4 (L) 11/14/2021 0709   HCT 30.8 (L) 11/14/2021 0709   PLT 231 11/14/2021 0709   MCV 84.6 11/14/2021 0709   MCH 28.6 11/14/2021 0709   MCHC 33.8 11/14/2021 0709   RDW 13.2 11/14/2021 0709   LYMPHSABS 1.7 11/14/2021 0709   MONOABS 0.6 11/14/2021 0709   EOSABS 0.1 11/14/2021 0709   BASOSABS 0.0 11/14/2021 0709       Latest Ref Rng & Units 11/13/2021    8:44 PM 11/13/2021    4:07 AM 11/12/2021   12:34 PM  BMP  Glucose 70 - 99 mg/dL 234  225  282   BUN 8 - 23 mg/dL 23  35  30   Creatinine 0.44 - 1.00 mg/dL 1.07  1.34  1.31   Sodium 135 - 145 mmol/L 141  132  132   Potassium 3.5 - 5.1 mmol/L 4.1  3.1  3.5   Chloride 98 - 111 mmol/L 107  102  103   CO2 22 - 32 mmol/L '23  23  21   '$ Calcium 8.9 - 10.3 mg/dL 8.8  7.5  7.7    ABG    Component Value Date/Time   PHART 7.399 11/11/2021 0859   PCO2ART 47.7 11/11/2021 0859   PO2ART 365 (H) 11/11/2021 0859   HCO3 29.2 (H) 11/11/2021 0859   TCO2 31 11/11/2021 0859   O2SAT 100 11/11/2021 0859  [employer] panel negative CSF protein 75 CSF glucose 245 (with elevated blood glucose) CSF white cell count 6 CSF HSV PCR negative Autoimmune  encephalitis panel pending  Imaging Reviewed: CT head: no acute abnormality CTA head and neck: no LVO or hemodynamically significant stenosis MRI brain: No acute abnormality, motion degraded  Overnight EEG: Negative for seizure.  Assessment: 71 year old patient with history of HTN and HLD presents with unresponsiveness and right sided weakness which occurred after several weeks of mild confusion and an episode of acute confusion and disorientation the night before presentation.  EEG has been negative for seizure activity, and brain imaging has been negative for acute infarct. CSF protein is elevated and HSV PCR is negative so no longer have concern HSV encephalitis. Awaiting results of autoimmune encephalitis  panel. Would not give steroids at this point due to persistent hyperglycemia requiring IV insulin. Respiratory culture positive for yeast, lactate elevated to 2.4. Patient was afebrile briefly yesterday 11/13/21 but now temperature back up. Patient is improved this morning since extubation and able to intermittently follow commands. Not at her baseline but suspect may continue to improve over time due to correction of hyperglycemia and other electrolyte disturbances.  Impression: Acute toxic metabolic encephalopathy due to hyperglycemia vs. autoimmune encephalitis Other differential to consider given persistent nausea and vomiting prior to admission is Wernicke's  Recommendations: -Discontinue IV acyclovir. -treatment of pneumonia per primary team -high-dose thiamine replenishment -Check B12 levels. No point in checking B1 levels as she is already being supplemented -No repeat head imaging indicated at this time.  Portions of the patient encounter and this note were written and completed by Wilhemina Cash, PA-S. Full patient evaluation and verification of findings were completed by the attending provider, Dr. Amie Portland.  11/14/2021 9:42 AM   Attending Neurohospitalist Addendum Patient seen  and examined with APP/Resident. Agree with the history and physical as documented above. Agree with the plan as documented, which I helped formulate. I have independently reviewed the chart, obtained history, review of systems and examined the patient.I have personally reviewed pertinent head/neck/spine imaging (CT/MRI). Please feel free to call with any questions.  -- Amie Portland, MD Neurologist [facility] Pager: [phone]

## 2021-11-14 NOTE — Progress Notes (Addendum)
69 yoF who had a fungal culture with stain showing yeast growth that resulted today. On the culture the specimen is reported as "Lumbar puncture; CSF" and in the results in the fungal source it states "tracheal aspirate". Contacted [facility] to verify whether the culture specimen was CSF or tracheal aspirate. The Lab stated that it was tracheal aspirate, but will double check the specimen. Gave them contact number [phone] to call back.   [facility] Phone#: [phone] [facility] Account number: [id number]  Sandford Craze, PharmD. [facility] PGY-1  11/14/2021 12:41 PM   ADDENDUM Followed up with [facility] since did not call back. They cannot get ahold of the person I talked to earlier today to see if they checked. Could possibly be 48-72h per Ivin Booty. Reached out to another worker Regino Schultze who said he will try to reach out and find me an answer. Gave callback number of [phone].  Sandford Craze, PharmD. [facility] PGY-1  11/14/2021 4:18 PM

## 2021-11-14 NOTE — Progress Notes (Signed)
NAME:  Victoria Jimenez, MRN:  [id number], DOB:  [birth year], LOS: 3 ADMISSION DATE:  11/11/2021, CONSULTATION DATE:  11/11/2021 REFERRING MD:  EDP, CHIEF COMPLAINT:  Altered Mental Status    History of Present Illness:  This is a 71 year old female with a past medical history of HSV, hyperlipidemia, CAD, hyperlipidemia who presented to the emergency room febrile, hypertensive, altered mental status, requiring intubation and sedation.  Patient was found to be in DKA, with severe symptomatic hypertension, as well as acute metabolic encephalopathy, and admitted for further work-up.  Pertinent  Medical History  HSV, CAD, hyperlipidemia, hypertension  Significant Hospital Events: Including procedures, antibiotic start and stop dates in addition to other pertinent events   10/03: Intubated and admit to ICU 10/05: Patient extubated   Interim History / Subjective:  Patient is awake on exam today.  Patient is able to track me across the room.  Patient is able to talk, but is not speaking appropriately.  I asked her what her birthday is, and she states [birth year].  I tried asked what her name is, but she is unable to tell me her name.  This is not the patient's baseline.  Patient is not following commands appropriately. Objective   Blood pressure (!) 177/72, pulse 92, temperature 99.5 F (37.5 C), temperature source Bladder, resp. rate (!) 23, height '5\' 6"'$  (1.676 m), weight 100.2 kg, SpO2 96 %.    Vent Mode: PSV;CPAP FiO2 (%):  [40 %] 40 % PEEP:  [5 cmH20] 5 cmH20 Pressure Support:  [5 cmH20] 5 cmH20   Intake/Output Summary (Last 24 hours) at 11/14/2021 0737 Last data filed at 11/14/2021 0600 Gross per 24 hour  Intake 4172.86 ml  Output 3600 ml  Net 572.86 ml    Filed Weights   11/12/21 0515 11/13/21 0600 11/14/21 0500  Weight: 99.6 kg 103.1 kg 100.2 kg    Examination: General: Patient resting comfortably in bed with mittens on Eyes: Patient is blinking spontaneously with no injected  conjunctiva  Head: Normocephalic, atraumatic  Cardio: Regular rate and rhythm, no murmur, rubs, or gallops Pulmonary: Clear to ausculation bilaterally with no rales, rhonchi, and crackles  Abdomen: Soft, with no distention noted Neuro: Patient is not following commands.  Patient is speaking appropriately to questions. Skin: No rashes noted   Resolved Hospital Problem list   Acute respiratory failure secondary to acute metabolic encephalopathy DKA Severe symptomatic hypertension  Assessment & Plan:  This is a 71 year old female with a past medical history of HSV, hyperlipidemia, CAD, hyperlipidemia who presented to the emergency room febrile, hypertensive, altered mental status, requiring intubation and sedation.  Patient was found to be in DKA, with severe symptomatic hypertension, as well as acute metabolic encephalopathy, and admitted for further work-up.  #Acute metabolic encephalopathy, resolving #Acute respiratory failure secondary to acute metabolic encephalopathy, resolved Patient is still not back to baseline.  Patient still having trouble following commands.  Patient not speaking appropriately.  Per neuro's note, patient is following commands, this could be that during my exam she was just waking up.  She was requiring frequent reorientation.  Continue to monitor neurological function.  Patient not require ICU care at this point, and can safely be transferred out -Continue frequent neurochecks -Continue with thiamine for Warnicke's -Neuro following, appreciate recs -Autoimmune panel pending  #DKA, resolved #Type 2 diabetes mellitus Patient's A1c 7.4.  Patient has been transitioned off of insulin drip.  Patient is now on her sliding scale insulin.  Patient is not getting basal insulin  given that the patient is not eating regularly.  We will schedule out sliding scale insulin.  Patient's glucose measuring in the low 200s.  -Frequent CBGs -Trend BMP -Continue sliding scale -If  patient starts eating regularly, can consider basal -Hold home metformin  #Primary hypertension Patient becoming hypertensive yesterday into the 180s on home Norvasc 10 mg daily.  Patient does have as needed hydralazine and labetalol.  We will start resuming home meds.  Resume home losartan and HCTZ.  We will continue to monitor blood pressure. -Continue to monitor blood pressure closely -Monitor for signs of hypertension -Amlodipine 10 mg daily -Start HCTZ 25 mg daily -Start back home losartan 100 mg daily -Continue to monitor blood pressure -As needed hydralazine and labetalol with goal blood pressure less than 160  #Prerenal acute kidney injury, resolved Creatinine down to 1.07.  It is at baseline.  -Continue to monitor creatinine -Continue maintenance fluid  #CAD -Continue home atorvastatin 40 mg daily -Continue home aspirin 81 mg daily  #Diabetic neuropathy -Consider resuming home gabapentin as patient becomes more alert and oriented  Best Practice (right click and "Reselect all SmartList Selections" daily)   Diet/type: Clear liquid diet DVT prophylaxis: prophylactic heparin  GI prophylaxis: PPI Lines: Right PICC Foley: N/A Code Status:  full code  Labs   CBC: Recent Labs  Lab 11/11/21 0525 11/11/21 0800 11/11/21 0859 11/12/21 0450 11/13/21 0407 11/14/21 0709  WBC 9.9 11.0*  --  15.9* 12.8* 10.0  NEUTROABS 7.2  --   --   --  9.8* 7.5  HGB 13.7  13.9 14.7 12.6 10.1* 9.5* 10.4*  HCT 42.0  41.0 42.0 37.0 29.6* 27.1* 30.8*  MCV 88.2 81.7  --  85.1 86.6 84.6  PLT 263 292  --  216 195 231     Basic Metabolic Panel: Recent Labs  Lab 11/11/21 0759 11/11/21 0800 11/11/21 1529 11/11/21 1851 11/12/21 0354 11/12/21 0450 11/12/21 0819 11/12/21 1234 11/13/21 0407 11/13/21 2044  NA  --    < > 137   < > 132*  --  136 132* 132* 141  K  --    < > 3.3*   < > 5.0  --  4.1 3.5 3.1* 4.1  CL  --    < > 100   < > 102  --  102 103 102 107  CO2  --    < > 20*   < >  21*  --  22 21* 23 23  GLUCOSE  --    < > 386*   < > 205*  --  173* 282* 225* 234*  BUN  --    < > 21   < > 28*  --  28* 30* 35* 23  CREATININE  --    < > 1.68*   < > 1.43*  --  1.34* 1.31* 1.34* 1.07*  CALCIUM  --    < > 9.1   < > 8.0*  --  8.4* 7.7* 7.5* 8.8*  MG 1.7  --  1.3*  --   --  2.2 2.4  --   --   --   PHOS  --   --  1.3*  --   --  3.9 4.6  --   --   --    < > = values in this interval not displayed.    GFR: Estimated Creatinine Clearance: 60.1 mL/min (A) (by C-G formula based on SCr of 1.07 mg/dL (H)). Recent Labs  Lab 11/11/21 [phone]  11/11/21 0800 11/11/21 1033 11/11/21 1528 11/12/21 0354 11/12/21 0450 11/13/21 0407 11/14/21 0709  PROCALCITON <0.10  --   --   --   --   --   --   --   WBC  --  11.0*  --   --   --  15.9* 12.8* 10.0  LATICACIDVEN  --  3.0* 5.9* 7.7* 2.6*  --   --   --      Liver Function Tests: Recent Labs  Lab 11/11/21 0525  AST 21  ALT 18  ALKPHOS 94  BILITOT 0.6  PROT 7.8  ALBUMIN 4.1    No results for input(s): "LIPASE", "AMYLASE" in the last 168 hours. Recent Labs  Lab 11/11/21 0800  AMMONIA 15     ABG    Component Value Date/Time   PHART 7.399 11/11/2021 0859   PCO2ART 47.7 11/11/2021 0859   PO2ART 365 (H) 11/11/2021 0859   HCO3 29.2 (H) 11/11/2021 0859   TCO2 31 11/11/2021 0859   O2SAT 100 11/11/2021 0859     Coagulation Profile: Recent Labs  Lab 11/11/21 0759  INR 1.1     Cardiac Enzymes: Recent Labs  Lab 11/11/21 1429  CKTOTAL 86     HbA1C: Hgb A1c MFr Bld  Date/Time Value Ref Range Status  11/11/2021 08:00 AM 7.4 (H) 4.8 - 5.6 % Final    Comment:    (NOTE) Pre diabetes:          5.7%-6.4%  Diabetes:              >6.4%  Glycemic control for   <7.0% adults with diabetes   09/03/2015 03:47 AM 6.4 (H) 4.8 - 5.6 % Final    Comment:    (NOTE)         Pre-diabetes: 5.7 - 6.4         Diabetes: >6.4         Glycemic control for adults with diabetes: <7.0     CBG: Recent Labs  Lab 11/13/21 1920  11/13/21 2110 11/13/21 2319 11/14/21 0123 11/14/21 0322  GLUCAP 223* 233* 226* 238* 222*     Review of Systems:   Negative except for what is stated in the HPI   Past Medical History:  She,  has a past medical history of Allergic rhinitis, Coronary artery disease, Diabetes mellitus without complication ([place]), GERD (gastroesophageal reflux disease), Herpes simplex, Hyperlipidemia, Hypertension, OSA (obstructive sleep apnea), and Sleep apnea.   Surgical History:   Past Surgical History:  Procedure Laterality Date   ABDOMINAL HYSTERECTOMY     ANGIOPLASTY     CARDIAC CATHETERIZATION     TUBAL LIGATION       Social History:   reports that she has never smoked. She has never used smokeless tobacco. She reports current alcohol use. She reports that she does not use drugs.   Family History:  Her family history includes Coronary artery disease in her father; Diabetes in her brother, maternal grandmother, and mother; Hypertension in her sister; Lung cancer in her mother. There is no history of Colon cancer.   Allergies No Known Allergies   Home Medications  Prior to Admission medications   Medication Sig Start Date End Date Taking? Authorizing Provider  acetaminophen (TYLENOL) 325 MG tablet Take 650 mg by mouth every 6 (six) hours as needed for mild pain, moderate pain, fever or headache.   Yes [provider]  aspirin EC 81 MG tablet Take 81 mg by mouth daily.   Yes  [provider]  atorvastatin (LIPITOR) 40 MG tablet Take 40 mg by mouth daily. 07/14/21  Yes [provider]  Calcium Carbonate-Vit D-Min (CALTRATE 600+D PLUS MINERALS) 600-800 MG-UNIT TABS Take 1 tablet by mouth in the morning and at bedtime.   Yes [provider]  Cholecalciferol (VITAMIN D3) 1000 units CAPS Take 1 capsule by mouth daily.   Yes [provider]  Cranberry-Vitamin C-Probiotic (AZO CRANBERRY PO) Take 2 capsules by mouth daily.   Yes [provider]   gabapentin (NEURONTIN) 300 MG capsule Take 600 mg by mouth 3 (three) times daily. 01/27/16  Yes [provider]  hydrochlorothiazide (HYDRODIURIL) 25 MG tablet Take 25 mg by mouth daily. 02/05/16  Yes [provider]  loratadine (CLARITIN) 10 MG tablet Take 10 mg by mouth daily.   Yes [provider]  losartan (COZAAR) 100 MG tablet Take 100 mg by mouth daily. 07/14/21  Yes [provider]  metFORMIN (GLUCOPHAGE) 1000 MG tablet Take 1,000 mg by mouth 2 (two) times daily. 10/07/18  Yes [provider]  pantoprazole (PROTONIX) 40 MG tablet Take 40 mg by mouth daily.   Yes [provider]  POTASSIUM PO Take 99 mg by mouth daily as needed (for cramping (legs & feet)).   Yes [provider]  VICTOZA 18 MG/3ML SOPN Inject 1.2 mg into the skin once a week. 07/11/21  Yes [provider]     Critical care time: [place], DO Internal Medicine Resident PGY-1 Pager: [phone]

## 2021-11-15 DIAGNOSIS — G9341 Metabolic encephalopathy: Secondary | ICD-10-CM | POA: Diagnosis not present

## 2021-11-15 DIAGNOSIS — N179 Acute kidney failure, unspecified: Secondary | ICD-10-CM

## 2021-11-15 LAB — BASIC METABOLIC PANEL
Anion gap: 10 (ref 5–15)
BUN: 11 mg/dL (ref 8–23)
CO2: 27 mmol/L (ref 22–32)
Calcium: 9.1 mg/dL (ref 8.9–10.3)
Chloride: 99 mmol/L (ref 98–111)
Creatinine, Ser: 0.93 mg/dL (ref 0.44–1.00)
GFR, Estimated: >60 mL/min (ref >60)
Glucose, Bld: 282 mg/dL — ABNORMAL HIGH (ref 70–99)
Potassium: 3.2 mmol/L — ABNORMAL LOW (ref 3.5–5.1)
Sodium: 136 mmol/L (ref 135–145)

## 2021-11-15 LAB — CBC WITH DIFFERENTIAL/PLATELET
Abs Immature Granulocytes: 0.04 10*3/uL (ref 0.00–0.07)
Basophils Absolute: 0 10*3/uL (ref 0.0–0.1)
Basophils Relative: 0 %
Eosinophils Absolute: 0.1 10*3/uL (ref 0.0–0.5)
Eosinophils Relative: 1 %
HCT: 31.4 % — ABNORMAL LOW (ref 36.0–46.0)
Hemoglobin: 10.8 g/dL — ABNORMAL LOW (ref 12.0–15.0)
Immature Granulocytes: 1 %
Lymphocytes Relative: 24 %
Lymphs Abs: 1.9 10*3/uL (ref 0.7–4.0)
MCH: 28.5 pg (ref 26.0–34.0)
MCHC: 34.4 g/dL (ref 30.0–36.0)
MCV: 82.8 fL (ref 80.0–100.0)
Monocytes Absolute: 0.6 10*3/uL (ref 0.1–1.0)
Monocytes Relative: 7 %
Neutro Abs: 5.1 10*3/uL (ref 1.7–7.7)
Neutrophils Relative %: 67 %
Platelets: 240 10*3/uL (ref 150–400)
RBC: 3.79 MIL/uL — ABNORMAL LOW (ref 3.87–5.11)
RDW: 12.9 % (ref 11.5–15.5)
WBC: 7.7 10*3/uL (ref 4.0–10.5)
nRBC: 0 % (ref 0.0–0.2)

## 2021-11-15 LAB — GLUCOSE, CAPILLARY
Glucose-Capillary: 402 mg/dL — ABNORMAL HIGH (ref 70–99)
Glucose-Capillary: 316 mg/dL — ABNORMAL HIGH (ref 70–99)
Glucose-Capillary: 178 mg/dL — ABNORMAL HIGH (ref 70–99)
Glucose-Capillary: 315 mg/dL — ABNORMAL HIGH (ref 70–99)
Glucose-Capillary: 456 mg/dL — ABNORMAL HIGH (ref 70–99)
Glucose-Capillary: 399 mg/dL — ABNORMAL HIGH (ref 70–99)

## 2021-11-15 LAB — TRIGLYCERIDES: Triglycerides: 124 mg/dL (ref <150)

## 2021-11-15 MED ORDER — POTASSIUM CHLORIDE CRYS ER 20 MEQ PO TBCR
40.0000 meq | EXTENDED_RELEASE_TABLET | ORAL | Status: AC
  Administered 2021-11-15 (×2): 40 meq via ORAL
  Filled 2021-11-15 (×2): qty 2

## 2021-11-15 MED ORDER — INSULIN DETEMIR 100 UNIT/ML ~~LOC~~ SOLN: 15.0000 [IU] | Freq: Every day | SUBCUTANEOUS | Status: DC

## 2021-11-15 MED ORDER — MELATONIN 3 MG PO TABS
3.0000 mg | ORAL_TABLET | Freq: Every day | ORAL | Status: DC
  Administered 2021-11-15 – 2021-11-17 (×3): 3 mg via ORAL
  Filled 2021-11-15 (×3): qty 1

## 2021-11-15 MED ORDER — INSULIN ASPART 100 UNIT/ML IJ SOLN
15.0000 [IU] | Freq: Once | INTRAMUSCULAR | Status: AC
  Administered 2021-11-15: 15 [IU] via SUBCUTANEOUS

## 2021-11-15 MED ORDER — INSULIN ASPART 100 UNIT/ML IJ SOLN
0.0000 [IU] | Freq: Every day | INTRAMUSCULAR | Status: DC
  Administered 2021-11-16: 2 [IU] via SUBCUTANEOUS

## 2021-11-15 MED ORDER — FOLIC ACID 1 MG PO TABS
1.0000 mg | ORAL_TABLET | Freq: Every day | ORAL | Status: DC
  Administered 2021-11-15 – 2021-11-18 (×4): 1 mg via ORAL
  Filled 2021-11-15 (×4): qty 1

## 2021-11-15 MED ORDER — INSULIN ASPART 100 UNIT/ML IJ SOLN
0.0000 [IU] | Freq: Three times a day (TID) | INTRAMUSCULAR | Status: DC
  Administered 2021-11-15: 15 [IU] via SUBCUTANEOUS
  Administered 2021-11-15: 4 [IU] via SUBCUTANEOUS
  Administered 2021-11-16: 11 [IU] via SUBCUTANEOUS
  Administered 2021-11-16 – 2021-11-17 (×3): 7 [IU] via SUBCUTANEOUS
  Administered 2021-11-17 – 2021-11-18 (×2): 11 [IU] via SUBCUTANEOUS
  Administered 2021-11-18: 7 [IU] via SUBCUTANEOUS

## 2021-11-15 MED ORDER — INSULIN DETEMIR 100 UNIT/ML ~~LOC~~ SOLN
15.0000 [IU] | Freq: Two times a day (BID) | SUBCUTANEOUS | Status: DC
  Administered 2021-11-15: 15 [IU] via SUBCUTANEOUS
  Filled 2021-11-15 (×3): qty 0.15

## 2021-11-15 MED ORDER — INSULIN DETEMIR 100 UNIT/ML ~~LOC~~ SOLN
10.0000 [IU] | Freq: Two times a day (BID) | SUBCUTANEOUS | Status: DC
  Administered 2021-11-15: 10 [IU] via SUBCUTANEOUS
  Filled 2021-11-15 (×2): qty 0.1

## 2021-11-15 NOTE — Progress Notes (Addendum)
PROGRESS NOTE    Victoria Jimenez  TDV:[id number] DOB: September 14, 1950 DOA: 11/11/2021 PCP: Buzzy Han, MD (Inactive)   Brief Narrative:  71 year old female with past medical history of HSV, hyperlipidemia, CAD, diabetes mellitus type 2 presented with altered mental status and was initially hypertensive requiring intubation and sedation and admission to ICU.  She was found to be in DKA, treated with IV fluids/insulin drip and subsequently transitioned to long-acting insulin.  Neurology was consulted.  MRI was negative for acute stroke; EEG negative for seizures; LP was inconclusive.  She was extubated on 11/13/2021.  She was transferred to [facility] service from 11/15/2021 onwards.  Assessment & Plan:   Acute metabolic encephalopathy -Work-up so far has been negative including MRI brain/EEG with LP being inconclusive.  Ammonia and TSH normal.  Neurology following.  Neurology not worried about HSV encephalitis since HSV PCR was negative; awaiting results of autoimmune encephalitis panel. -Mental status improving with still slow and having intermittent hallucinations.  Currently on high dose thiamine: Might be concern for Warnicke's encephalopathy -Fall precautions.  Monitor mental status. -Check vitamin B12 level.  Start empiric folic acid supplementation.  Acute respiratory failure with hypoxia -Possibly due to mental status abnormality.  Intubated on presentation and subsequently extubated on 11/13/2021.  Transferred to [facility] service from 11/15/2020 onwards.  Currently on room air.  DKA Diabetes mellitus type 2 with hyperglycemia -A1c 7.4.  Has already been transitioned to long-acting insulin.  Blood sugars still elevated.  Increase long-acting insulin.  Continue CBGs with SSI.  Diabetes coordinator following.  Advance diet to carb modified diet.  Metformin on hold  Hypertension --Blood pressure still intermittently on the higher side.  Continue amlodipine, hydrochlorothiazide and losartan.   Continue as needed IV antihypertensives  Acute kidney injury -Resolved.  Creatinine stable  Leukocytosis -Resolved  Hypokalemia -Replace.  Repeat a.m. labs  Anemia of chronic disease -Questionable cause.  Hemoglobin stable.  Monitor intermittently  CAD -Stable.  No chest pain.  Continue aspirin and statin  Diabetic neuropathy -gabapentin on hold.  Mental status still does not allow to resume gabapentin.  Obesity -Outpatient follow-up  Physical deconditioning -PT eval    DVT prophylaxis: Heparin Code Status: Full Family Communication: Grandson at bedside. Called husband on phone but went straight to voicemail Disposition Plan: Status is: Inpatient Remains inpatient appropriate because: Of severe tenderness    Consultants: PCCM/neurology  Procedures: EEG/LP  Antimicrobials: None   Subjective: Patient seen and examined at bedside.  Awake, slow to respond, answers some questions but confused to time.  No fever, vomiting, chest pain reported.  Nursing staff reports increased hallucinations overnight.  Grandson at bedside.  Objective: Vitals:   11/15/21 0322 11/15/21 0731 11/15/21 1000 11/15/21 1051  BP: (!) 185/70 (!) 205/75 (!) 202/70 (!) 177/67  Pulse: 81 86 80 84  Resp: 17 18    Temp: 98.6 F (37 C) 98.7 F (37.1 C)    TempSrc: Oral Oral    SpO2: 95% 96%  100%  Weight:      Height:        Intake/Output Summary (Last 24 hours) at 11/15/2021 1052 Last data filed at 11/15/2021 0800 Gross per 24 hour  Intake 757.89 ml  Output 1975 ml  Net -1217.11 ml   Filed Weights   11/13/21 0600 11/14/21 0500 11/14/21 2041  Weight: 103.1 kg 100.2 kg 99.3 kg    Examination:  General exam: Appears calm and comfortable.  Currently on room air.  Poor historian. Respiratory system: Bilateral decreased breath sounds  at bases with some scattered crackles Cardiovascular system: S1 & S2 heard, Rate controlled Gastrointestinal system: Abdomen is obese, nondistended,  soft and nontender. Normal bowel sounds heard. Extremities: No cyanosis, clubbing; trace lower extremity edema Central nervous system: A, slightly confused to time.  Slow to respond.  No focal neurological deficits. Moving extremities Skin: No rashes, lesions or ulcers Psychiatry: Not agitated.  Flat affect  Data Reviewed: I have personally reviewed following labs and imaging studies  CBC: Recent Labs  Lab 11/11/21 0525 11/11/21 0800 11/11/21 0859 11/12/21 0450 11/13/21 0407 11/14/21 0709 11/15/21 1015  WBC 9.9 11.0*  --  15.9* 12.8* 10.0 7.7  NEUTROABS 7.2  --   --   --  9.8* 7.5 5.1  HGB 13.7  13.9 14.7 12.6 10.1* 9.5* 10.4* 10.8*  HCT 42.0  41.0 42.0 37.0 29.6* 27.1* 30.8* 31.4*  MCV 88.2 81.7  --  85.1 86.6 84.6 82.8  PLT 263 292  --  216 195 231 413   Basic Metabolic Panel: Recent Labs  Lab 11/11/21 0759 11/11/21 0800 11/11/21 1529 11/11/21 1851 11/12/21 0450 11/12/21 0819 11/12/21 1234 11/13/21 0407 11/13/21 2044 11/14/21 0709  NA  --    < > 137   < >  --  136 132* 132* 141 138  K  --    < > 3.3*   < >  --  4.1 3.5 3.1* 4.1 4.0  CL  --    < > 100   < >  --  102 103 102 107 103  CO2  --    < > 20*   < >  --  22 21* '23 23 25  '$ GLUCOSE  --    < > 386*   < >  --  173* 282* 225* 234* 261*  BUN  --    < > 21   < >  --  28* 30* 35* 23 19  CREATININE  --    < > 1.68*   < >  --  1.34* 1.31* 1.34* 1.07* 0.99  CALCIUM  --    < > 9.1   < >  --  8.4* 7.7* 7.5* 8.8* 8.8*  MG 1.7  --  1.3*  --  2.2 2.4  --   --   --  2.0  PHOS  --   --  1.3*  --  3.9 4.6  --   --   --   --    < > = values in this interval not displayed.   GFR: Estimated Creatinine Clearance: 64.7 mL/min (by C-G formula based on SCr of 0.99 mg/dL). Liver Function Tests: Recent Labs  Lab 11/11/21 0525 11/14/21 0709  AST 21 28  ALT 18 22  ALKPHOS 94 60  BILITOT 0.6 0.9  PROT 7.8 6.7  ALBUMIN 4.1 3.2*   No results for input(s): "LIPASE", "AMYLASE" in the last 168 hours. Recent Labs  Lab  11/11/21 0800  AMMONIA 15   Coagulation Profile: Recent Labs  Lab 11/11/21 0759  INR 1.1   Cardiac Enzymes: Recent Labs  Lab 11/11/21 1429  CKTOTAL 86   BNP (last 3 results) No results for input(s): "PROBNP" in the last 8760 hours. HbA1C: No results for input(s): "HGBA1C" in the last 72 hours. CBG: Recent Labs  Lab 11/14/21 1505 11/14/21 1927 11/14/21 2119 11/15/21 0833 11/15/21 0948  GLUCAP 374* 235* 258* 402* 316*   Lipid Profile: No results for input(s): "CHOL", "HDL", "LDLCALC", "TRIG", "CHOLHDL", "LDLDIRECT" in the last  72 hours. Thyroid Function Tests: No results for input(s): "TSH", "T4TOTAL", "FREET4", "T3FREE", "THYROIDAB" in the last 72 hours. Anemia Panel: No results for input(s): "VITAMINB12", "FOLATE", "FERRITIN", "TIBC", "IRON", "RETICCTPCT" in the last 72 hours. Sepsis Labs: Recent Labs  Lab 11/11/21 0759 11/11/21 0800 11/11/21 1033 11/11/21 1528 11/12/21 0354  PROCALCITON <0.10  --   --   --   --   LATICACIDVEN  --  3.0* 5.9* 7.7* 2.6*    Recent Results (from the past 240 hour(s))  Culture, blood (single)     Status: None (Preliminary result)   Collection Time: 11/11/21  6:59 AM   Specimen: BLOOD RIGHT HAND  Result Value Ref Range Status   Specimen Description BLOOD RIGHT HAND  Final   Special Requests   Final    BOTTLES DRAWN AEROBIC AND ANAEROBIC Blood Culture adequate volume   Culture   Final    NO GROWTH 4 DAYS Performed at [facility], [place] [address] [place], [place] [address]    Report Status PENDING  Incomplete  Urine Culture     Status: None   Collection Time: 11/11/21  8:00 AM   Specimen: Urine, Clean Catch  Result Value Ref Range Status   Specimen Description URINE, CLEAN CATCH  Final   Special Requests NONE  Final   Culture   Final    NO GROWTH Performed at [facility], [place] [address], [place], [place] [address]    Report Status 11/12/2021 FINAL  Final  Culture, blood (Routine X 2) w Reflex to ID Panel      Status: None (Preliminary result)   Collection Time: 11/11/21 10:33 AM   Specimen: BLOOD RIGHT HAND  Result Value Ref Range Status   Specimen Description BLOOD RIGHT HAND  Final   Special Requests   Final    BOTTLES DRAWN AEROBIC ONLY Blood Culture adequate volume   Culture   Final    NO GROWTH 4 DAYS Performed at [facility], [place] [address] [place], [place] [address]    Report Status PENDING  Incomplete  CSF culture w Gram Stain     Status: None   Collection Time: 11/11/21 10:52 AM   Specimen: CSF; Cerebrospinal Fluid  Result Value Ref Range Status   Specimen Description CSF  Final   Special Requests NONE  Final   Gram Stain   Final    WBC PRESENT, PREDOMINANTLY PMN NO ORGANISMS SEEN CYTOSPIN SMEAR    Culture   Final    NO GROWTH 3 DAYS Performed at [facility], [place] [address], [place], [place] [address]    Report Status 11/14/2021 FINAL  Final  VZV PCR, CSF     Status: None   Collection Time: 11/11/21 10:52 AM   Specimen: Lumbar Puncture; Cerebrospinal Fluid  Result Value Ref Range Status   VZV PCR, CSF Negative Negative Final    Comment: (NOTE) No Varicella Zoster Virus DNA detected. Performed At: [facility] [place], [place] [id number] Rush Farmer MD [id number]   Culture, Respiratory w Gram Stain     Status: None   Collection Time: 11/11/21 10:54 AM   Specimen: Tracheal Aspirate; Respiratory  Result Value Ref Range Status   Specimen Description TRACHEAL ASPIRATE  Final   Special Requests NONE  Final   Gram Stain   Final    RARE WBC PRESENT,BOTH PMN AND MONONUCLEAR FEW GRAM POSITIVE COCCI IN CLUSTERS    Culture   Final  RARE Normal respiratory flora-no Staph aureus or Pseudomonas seen Performed at [place] [address], [place], [place] [address]    Report Status 11/13/2021 FINAL  Final  Fungus Culture With Stain     Status: Abnormal (Preliminary result)   Collection Time: 11/11/21 10:57 AM    Specimen: Lumbar Puncture; Cerebrospinal Fluid  Result Value Ref Range Status   Fungus Stain Final report (A)  Final    Comment: (NOTE) Performed At: [facility] [place], [place] [id number] Rush Farmer MD [id number]    Fungus (Mycology) Culture PENDING  Incomplete   Fungal Source TRACHEAL ASPIRATE  Final    Comment: Performed at [facility], [place] [address], [place], [place] [address]  Fungus Culture Result     Status: Abnormal   Collection Time: 11/11/21 10:57 AM  Result Value Ref Range Status   Result 1 Yeast observed (A)  Final    Comment: (NOTE) Performed At: [facility] [address] [place], [place] [id number] Rush Farmer MD [id number]   Culture, blood (Routine X 2) w Reflex to ID Panel     Status: None (Preliminary result)   Collection Time: 11/11/21 12:51 PM   Specimen: BLOOD LEFT HAND  Result Value Ref Range Status   Specimen Description BLOOD LEFT HAND  Final   Special Requests IN PEDIATRIC BOTTLE Blood Culture adequate volume  Final   Culture   Final    NO GROWTH 4 DAYS Performed at [facility], [place] [address], [place], [place] [address]    Report Status PENDING  Incomplete  MRSA Next Gen by PCR, Nasal     Status: None   Collection Time: 11/11/21  6:52 PM   Specimen: Nasal Mucosa; Nasal Swab  Result Value Ref Range Status   MRSA by PCR Next Gen NOT DETECTED NOT DETECTED Final    Comment: (NOTE) The GeneXpert MRSA Assay (FDA approved for NASAL specimens only), is one component of a comprehensive MRSA colonization surveillance program. It is not intended to diagnose MRSA infection nor to guide or monitor treatment for MRSA infections. Test performance is not FDA approved in patients less than 25 years old. Performed at [facility], [place] [address] [place], [place] [address]          Radiology Studies: No results found.      Scheduled Meds:  amLODipine  10 mg Oral Daily   aspirin  81  mg Oral Daily   atorvastatin  40 mg Oral Daily   Chlorhexidine Gluconate Cloth  6 each Topical Daily   docusate sodium  100 mg Oral BID   feeding supplement  1 Container Oral TID BM   heparin  5,000 Units Subcutaneous Q8H   hydrochlorothiazide  25 mg Oral Daily   insulin aspart  0-15 Units Subcutaneous TID WC   insulin aspart  0-5 Units Subcutaneous QHS   insulin detemir  10 Units Subcutaneous BID   losartan  100 mg Oral Daily   pantoprazole  40 mg Oral Daily   polyethylene glycol  17 g Per Tube Daily   sodium chloride flush  10-40 mL Intracatheter Q12H   sodium chloride flush  3 mL Intravenous Once   Continuous Infusions:  thiamine (VITAMIN B1) injection 250 mg (11/15/21 1012)          Aline August, MD [facility] 11/15/2021, 10:52 AM

## 2021-11-15 NOTE — Progress Notes (Signed)
Neurology Progress Note  Brief HPI: Patient with a history of HTN and HLD who has been having mild confusion for the past few weeks (per grandson) was admitted on 10/3 with unresponsiveness and right sided weakness after falling out of bed.  She had had an episode of confusion the night before and had been disoriented to place. Brain MRI was negative for acute stroke, EEG has been negative for seizures and LP reveals elevated glucose (blood glucose also elevated) and elevated protein but no organisms seen on gram stain.  Patient has been febrile with positive respiratory cultures.  Subjective: Using her cell phone independently. Yolanda Bonine has concerns that she has not been sleeping well overnight.  Exam: Vitals:   11/15/21 0322 11/15/21 0731  BP: (!) 185/70 (!) 205/75  Pulse: 81 86  Resp: 17 18  Temp: 98.6 F (37 C) 98.7 F (37.1 C)  SpO2: 95% 96%   Gen: In bed, NAD. Resp: non-labored breathing, no acute distress Abd: soft, nt  Neuro: Mental Status: Awake and alert. Patient is only oriented to self. Recognizes that her grandson is in the room with her. Speech seems mildly dysarthric, but naming, repetition, and comprehension intact. Cranial Nerves: II: PERRL. III,IV, VI: EOMI without ptosis or diploplia. VII: Facial movement is symmetric resting and smiling VIII: Hearing is intact to voice X: Palate elevates symmetrically Motor: Tone is normal. Bulk is normal. 5/5 strength was present in all four extremities without drift. Sensory:  Sensation is symmetric to light touch bilaterally. DTRs: 2+ and symmetric in the patellae. Plantars: Toes are downgoing bilaterally. Cerebellar: Not tested. Gait:  Deferred.   Current Facility-Administered Medications:    acetaminophen (TYLENOL) tablet 650 mg, 650 mg, Oral, Q4H PRN, Posey Pronto, Amar, DO   amLODipine (NORVASC) tablet 10 mg, 10 mg, Oral, Daily, Patel, Amar, DO, 10 mg at 11/15/21 3244   aspirin chewable tablet 81 mg, 81 mg, Oral,  Daily, Leigh Aurora, DO, 81 mg at 11/15/21 0836   atorvastatin (LIPITOR) tablet 40 mg, 40 mg, Oral, Daily, Patel, Amar, DO, 40 mg at 11/15/21 0836   Chlorhexidine Gluconate Cloth 2 % PADS 6 each, 6 each, Topical, Daily, Chand, Currie Paris, MD, 6 each at 11/14/21 2249   dextrose 50 % solution 0-50 mL, 0-50 mL, Intravenous, PRN, Andres Labrum D, PA-C   docusate sodium (COLACE) capsule 100 mg, 100 mg, Oral, BID, Patel, Amar, DO, 100 mg at 11/15/21 0835   feeding supplement (BOOST / RESOURCE BREEZE) liquid 1 Container, 1 Container, Oral, TID BM, Mannam, Praveen, MD, 1 Container at 11/15/21 0858   heparin injection 5,000 Units, 5,000 Units, Subcutaneous, Q8H, Mick Sell, PA-C, 5,000 Units at 11/15/21 0533   hydrALAZINE (APRESOLINE) injection 10 mg, 10 mg, Intravenous, Q4H PRN, Anders Simmonds, MD, 10 mg at 11/14/21 1008   hydrochlorothiazide (HYDRODIURIL) tablet 25 mg, 25 mg, Oral, Daily, Patel, Amar, DO, 25 mg at 11/15/21 0835   insulin aspart (novoLOG) injection 0-15 Units, 0-15 Units, Subcutaneous, TID WC, Patel, Amar, DO, 20 Units at 11/15/21 0844   insulin aspart (novoLOG) injection 0-5 Units, 0-5 Units, Subcutaneous, QHS, Patel, Amar, DO, 3 Units at 11/14/21 2216   insulin detemir (LEVEMIR) injection 10 Units, 10 Units, Subcutaneous, BID, Alekh, Kshitiz, MD, 10 Units at 11/15/21 0855   labetalol (NORMODYNE) injection 10 mg, 10 mg, Intravenous, Q2H PRN, Anders Simmonds, MD, 10 mg at 11/15/21 0018   losartan (COZAAR) tablet 100 mg, 100 mg, Oral, Daily, Leigh Aurora, DO, 100 mg at 11/15/21 0835   ondansetron (  ZOFRAN) injection 4 mg, 4 mg, Intravenous, Q6H PRN, Anders Simmonds, MD, 4 mg at 11/13/21 2239   Oral care mouth rinse, 15 mL, Mouth Rinse, PRN, Spero Geralds, MD   pantoprazole (PROTONIX) EC tablet 40 mg, 40 mg, Oral, Daily, Patel, Amar, DO, 40 mg at 11/15/21 0835   polyethylene glycol (MIRALAX / GLYCOLAX) packet 17 g, 17 g, Per Tube, Daily, Mick Sell, PA-C, 17 g at 11/15/21 0836   sodium  chloride flush (NS) 0.9 % injection 10-40 mL, 10-40 mL, Intracatheter, Q12H, Chand, Currie Paris, MD, 10 mL at 11/15/21 0849   sodium chloride flush (NS) 0.9 % injection 10-40 mL, 10-40 mL, Intracatheter, PRN, Jacky Kindle, MD   sodium chloride flush (NS) 0.9 % injection 3 mL, 3 mL, Intravenous, Once, Pollina, Gwenyth Allegra, MD   [COMPLETED] thiamine (VITAMIN B1) 500 mg in normal saline (50 mL) IVPB, 500 mg, Intravenous, TID, Stopping previously hung infusion at 11/14/21 2250 **FOLLOWED BY** thiamine (VITAMIN B1) 250 mg in sodium chloride 0.9 % 50 mL IVPB, 250 mg, Intravenous, Daily, Leigh Aurora, DO   Pertinent Labs: CBC    Component Value Date/Time   WBC 10.0 11/14/2021 0709   RBC 3.64 (L) 11/14/2021 0709   HGB 10.4 (L) 11/14/2021 0709   HCT 30.8 (L) 11/14/2021 0709   PLT 231 11/14/2021 0709   MCV 84.6 11/14/2021 0709   MCH 28.6 11/14/2021 0709   MCHC 33.8 11/14/2021 0709   RDW 13.2 11/14/2021 0709   LYMPHSABS 1.7 11/14/2021 0709   MONOABS 0.6 11/14/2021 0709   EOSABS 0.1 11/14/2021 0709   BASOSABS 0.0 11/14/2021 0709       Latest Ref Rng & Units 11/14/2021    7:09 AM 11/13/2021    8:44 PM 11/13/2021    4:07 AM  BMP  Glucose 70 - 99 mg/dL 261  234  225   BUN 8 - 23 mg/dL 19  23  35   Creatinine 0.44 - 1.00 mg/dL 0.99  1.07  1.34   Sodium 135 - 145 mmol/L 138  141  132   Potassium 3.5 - 5.1 mmol/L 4.0  4.1  3.1   Chloride 98 - 111 mmol/L 103  107  102   CO2 22 - 32 mmol/L '25  23  23   '$ Calcium 8.9 - 10.3 mg/dL 8.8  8.8  7.5    ABG    Component Value Date/Time   PHART 7.399 11/11/2021 0859   PCO2ART 47.7 11/11/2021 0859   PO2ART 365 (H) 11/11/2021 0859   HCO3 29.2 (H) 11/11/2021 0859   TCO2 31 11/11/2021 0859   O2SAT 100 11/11/2021 0859  [employer] panel negative CSF protein 75 CSF glucose 245 (with elevated blood glucose) CSF white cell count 6 CSF HSV PCR negative Autoimmune encephalitis panel pending  Imaging Reviewed: CT head: no acute abnormality CTA head and neck: no  LVO or hemodynamically significant stenosis MRI brain: No acute abnormality, motion degraded  Overnight EEG: Negative for seizure.  Assessment: 70 year old patient with history of HTN and HLD presents with unresponsiveness and right sided weakness which occurred after several weeks of mild confusion and an episode of acute confusion and disorientation the night before presentation.  EEG has been negative for seizure activity, and brain imaging has been negative for acute infarct. CSF protein is elevated and HSV PCR is negative so no longer have concern HSV encephalitis. Awaiting results of autoimmune encephalitis panel. Would not give steroids at this point due to persistent hyperglycemia requiring  IV insulin. Respiratory culture positive for yeast, lactate elevated to 2.4. Patient was afebrile briefly yesterday 11/13/21 but now temperature back up. Not at her baseline but much improved from yesterday. Suspect may continue to improve over time due to correction of hyperglycemia and other electrolyte disturbances.  Impression: Acute toxic metabolic encephalopathy due to hyperglycemia vs. autoimmune encephalitis but less likely considering steady improvement without intervention. Other differential to consider given persistent nausea and vomiting prior to admission is Wernicke's  Recommendations: -treatment of pneumonia per primary team -Continue high-dose thiamine replenishment as needed -Melatonin at night to regulate sleep-wake cycle. -No repeat head imaging indicated at this time.  Neurology will be available as needed. Please call with questions.  Portions of the patient encounter and this note were written and completed by Wilhemina Cash, PA-S. Full patient evaluation and verification of findings were completed by the attending provider, Dr. Amie Portland.  11/15/2021 9:07 AM  Attending Neurohospitalist Addendum Patient seen and examined with APP/Resident. Agree with the history and physical as  documented above. Agree with the plan as documented, which I helped formulate. I have independently reviewed the chart, obtained history, review of systems and examined the patient.I have personally reviewed pertinent head/neck/spine imaging (CT/MRI). Please feel free to call with any questions.  -- Amie Portland, MD Neurologist [facility] Pager: [phone]

## 2021-11-15 NOTE — Inpatient Diabetes Management (Signed)
Inpatient Diabetes Program Recommendations  AACE/ADA: New Consensus Statement on Inpatient Glycemic Control   Target Ranges:  Prepandial:   less than 140 mg/dL      Peak postprandial:   less than 180 mg/dL (1-2 hours)      Critically ill patients:  140 - 180 mg/dL    Latest Reference Range & Units 11/14/21 07:46 11/14/21 11:30 11/14/21 15:05 11/14/21 19:27 11/14/21 21:19 11/15/21 08:33  Glucose-Capillary 70 - 99 mg/dL 275 (H) 248 (H) 374 (H) 235 (H) 258 (H) 402 (H)   Review of Glycemic Control  Diabetes history: DM2 Outpatient Diabetes medications: Metformin 1000 mg BID Current orders for Inpatient glycemic control: Levemir 10 units BID, Novolog 0-15 units TID with meals  Inpatient Diabetes Program Recommendations:    Insulin: Please consider increasing Levemir to 15 units BID.  Thanks, Barnie Alderman, RN, MSN, [place] Diabetes Coordinator Inpatient Diabetes Program [phone] (Team Pager from 8am to [place])

## 2021-11-15 NOTE — Progress Notes (Addendum)
Pt has been pointing at the wall and asking what is she doing? And asking other questions about things she appears to be seeing on the wall. She also has been stating there are bugs on the FOB (on the board) and stated they were yellow. Her husband has just been telling her it was the meds they gave her yesterday. Pt has been saying bizarre things like this all shift ? Hallucinations??  Pt also took po med last night and ate a couple of crackers so she wouldn't get sick. Ate the crackers and drank water last night without nausea, emesis, or choking.

## 2021-11-16 DIAGNOSIS — G9341 Metabolic encephalopathy: Secondary | ICD-10-CM | POA: Diagnosis not present

## 2021-11-16 DIAGNOSIS — J9601 Acute respiratory failure with hypoxia: Secondary | ICD-10-CM | POA: Diagnosis not present

## 2021-11-16 DIAGNOSIS — E1110 Type 2 diabetes mellitus with ketoacidosis without coma: Secondary | ICD-10-CM

## 2021-11-16 DIAGNOSIS — N179 Acute kidney failure, unspecified: Secondary | ICD-10-CM | POA: Diagnosis not present

## 2021-11-16 LAB — CULTURE, BLOOD (SINGLE)
Culture: NO GROWTH
Special Requests: ADEQUATE

## 2021-11-16 LAB — CULTURE, BLOOD (ROUTINE X 2)
Culture: NO GROWTH
Culture: NO GROWTH
Special Requests: ADEQUATE
Special Requests: ADEQUATE

## 2021-11-16 LAB — COMPREHENSIVE METABOLIC PANEL
ALT: 29 U/L (ref 0–44)
AST: 29 U/L (ref 15–41)
Albumin: 2.9 g/dL — ABNORMAL LOW (ref 3.5–5.0)
Alkaline Phosphatase: 56 U/L (ref 38–126)
Anion gap: 14 (ref 5–15)
BUN: 18 mg/dL (ref 8–23)
CO2: 25 mmol/L (ref 22–32)
Calcium: 9.5 mg/dL (ref 8.9–10.3)
Chloride: 98 mmol/L (ref 98–111)
Creatinine, Ser: 1 mg/dL (ref 0.44–1.00)
GFR, Estimated: >60 mL/min (ref >60)
Glucose, Bld: 217 mg/dL — ABNORMAL HIGH (ref 70–99)
Potassium: 3.7 mmol/L (ref 3.5–5.1)
Sodium: 137 mmol/L (ref 135–145)
Total Bilirubin: 0.7 mg/dL (ref 0.3–1.2)
Total Protein: 6.2 g/dL — ABNORMAL LOW (ref 6.5–8.1)

## 2021-11-16 LAB — GLUCOSE, CAPILLARY
Glucose-Capillary: 230 mg/dL — ABNORMAL HIGH (ref 70–99)
Glucose-Capillary: 253 mg/dL — ABNORMAL HIGH (ref 70–99)
Glucose-Capillary: 246 mg/dL — ABNORMAL HIGH (ref 70–99)
Glucose-Capillary: 255 mg/dL — ABNORMAL HIGH (ref 70–99)

## 2021-11-16 LAB — CBC WITH DIFFERENTIAL/PLATELET
Abs Immature Granulocytes: 0.05 10*3/uL (ref 0.00–0.07)
Basophils Absolute: 0.1 10*3/uL (ref 0.0–0.1)
Basophils Relative: 1 %
Eosinophils Absolute: 0.2 10*3/uL (ref 0.0–0.5)
Eosinophils Relative: 3 %
HCT: 32 % — ABNORMAL LOW (ref 36.0–46.0)
Hemoglobin: 10.7 g/dL — ABNORMAL LOW (ref 12.0–15.0)
Immature Granulocytes: 1 %
Lymphocytes Relative: 27 %
Lymphs Abs: 1.9 10*3/uL (ref 0.7–4.0)
MCH: 28.5 pg (ref 26.0–34.0)
MCHC: 33.4 g/dL (ref 30.0–36.0)
MCV: 85.3 fL (ref 80.0–100.0)
Monocytes Absolute: 0.6 10*3/uL (ref 0.1–1.0)
Monocytes Relative: 9 %
Neutro Abs: 4.3 10*3/uL (ref 1.7–7.7)
Neutrophils Relative %: 59 %
Platelets: 242 10*3/uL (ref 150–400)
RBC: 3.75 MIL/uL — ABNORMAL LOW (ref 3.87–5.11)
RDW: 12.9 % (ref 11.5–15.5)
WBC: 7.2 10*3/uL (ref 4.0–10.5)
nRBC: 0.3 % — ABNORMAL HIGH (ref 0.0–0.2)

## 2021-11-16 LAB — VITAMIN B12: Vitamin B-12: 640 pg/mL (ref 180–914)

## 2021-11-16 LAB — MAGNESIUM: Magnesium: 2 mg/dL (ref 1.7–2.4)

## 2021-11-16 MED ORDER — INSULIN DETEMIR 100 UNIT/ML ~~LOC~~ SOLN
20.0000 [IU] | Freq: Two times a day (BID) | SUBCUTANEOUS | Status: DC
  Administered 2021-11-16 – 2021-11-17 (×3): 20 [IU] via SUBCUTANEOUS
  Filled 2021-11-16 (×4): qty 0.2

## 2021-11-16 NOTE — Progress Notes (Signed)
PROGRESS NOTE    Victoria Jimenez  DJS:[id number] DOB: 1950/09/14 DOA: 11/11/2021 PCP: Buzzy Han, MD (Inactive)   Brief Narrative:  71 year old female with past medical history of HSV, hyperlipidemia, CAD, diabetes mellitus type 2 presented with altered mental status and was initially hypertensive requiring intubation and sedation and admission to ICU.  She was found to be in DKA, treated with IV fluids/insulin drip and subsequently transitioned to long-acting insulin.  Neurology was consulted.  MRI was negative for acute stroke; EEG negative for seizures; LP was inconclusive.  She was extubated on 11/13/2021.  She was transferred to [facility] service from 11/15/2021 onwards.  Assessment & Plan:   Acute metabolic encephalopathy -Work-up so far has been negative including MRI brain/EEG with LP being inconclusive.  Ammonia and TSH normal.  Neurology signed off on 11/15/2021.  Neurology not worried about HSV encephalitis since HSV PCR was negative; awaiting results of autoimmune encephalitis panel. -Mental status improving but still slow to respond.  Slightly confused to time.  Currently on high dose thiamine: Might be concern for Warnicke's encephalopathy -Fall precautions.  Monitor mental status. -B12 level 640.  Continue empiric folic acid supplementation.  Acute respiratory failure with hypoxia -Possibly due to mental status abnormality.  Intubated on presentation and subsequently extubated on 11/13/2021.  Transferred to [facility] service from 11/15/2020 onwards.  Currently on room air.  DKA Diabetes mellitus type 2 with hyperglycemia -A1c 7.4.  Has already been transitioned to long-acting insulin.  Blood sugars still elevated.  Increase long-acting insulin to 20 units twice a day.  Continue CBGs with SSI.  Diabetes coordinator following.  Continue carb modified diet.  Metformin on hold  Hypertension -- Blood pressure much improved.  Continue amlodipine, hydrochlorothiazide and losartan.     Acute kidney injury -Resolved.  Creatinine stable  Leukocytosis -Resolved  Hypokalemia -Improved  Anemia of chronic disease -Questionable cause.  Hemoglobin stable.  Monitor intermittently  CAD -Stable.  No chest pain.  Continue aspirin and statin  Diabetic neuropathy -gabapentin on hold.  Mental status still does not allow to resume gabapentin.  Obesity -Outpatient follow-up  Physical deconditioning -PT recommended CIR.  Consult GI.    DVT prophylaxis: Heparin Code Status: Full Family Communication: Grandson at bedside on 11/15/2021.  None at bedside today Disposition Plan: Status is: Inpatient Remains inpatient appropriate because: Of severe DF weakness.  Need for CIR placement  Consultants: PCCM/neurology  Procedures: EEG/LP  Antimicrobials: None   Subjective: Patient seen and examined at bedside.  Sleepy, wakes up slightly, answers some questions, still confused to time.  No fever, vomiting, seizures or agitation reported. Objective: Vitals:   11/16/21 0500 11/16/21 0509 11/16/21 0522 11/16/21 0847  BP:  (!) 184/67 (!) 151/56 (!) 128/49  Pulse:  83 60 82  Resp:    (!) 22  Temp:    98.8 F (37.1 C)  TempSrc:    Oral  SpO2:  96% 97% 99%  Weight: 98.9 kg     Height:        Intake/Output Summary (Last 24 hours) at 11/16/2021 1050 Last data filed at 11/16/2021 0952 Gross per 24 hour  Intake 300 ml  Output 1400 ml  Net -1100 ml    Filed Weights   11/14/21 0500 11/14/21 2041 11/16/21 0500  Weight: 100.2 kg 99.3 kg 98.9 kg    Examination:  General: On room air.  No distress ENT/neck: No thyromegaly.  JVD is not elevated  respiratory: Decreased breath sounds at bases bilaterally with some crackles; no wheezing  CVS: S1-S2 heard, rate controlled currently Abdominal: Soft, nontender, slightly distended; no organomegaly,  bowel sounds are heard Extremities: Trace lower extremity edema; no cyanosis  CNS: Sleepy, wakes up slightly, answers some  questions, still confused to time. No focal neurologic deficit.  Moves extremities Lymph: No obvious lymphadenopathy Skin: No obvious ecchymosis/lesions  psych: No signs of agitation.  Affect is mostly flat  musculoskeletal: No obvious joint swelling/deformity   Data Reviewed: I have personally reviewed following labs and imaging studies  CBC: Recent Labs  Lab 11/11/21 0525 11/11/21 0800 11/12/21 0450 11/13/21 0407 11/14/21 0709 11/15/21 1015 11/16/21 0535  WBC 9.9   < > 15.9* 12.8* 10.0 7.7 7.2  NEUTROABS 7.2  --   --  9.8* 7.5 5.1 4.3  HGB 13.7  13.9   < > 10.1* 9.5* 10.4* 10.8* 10.7*  HCT 42.0  41.0   < > 29.6* 27.1* 30.8* 31.4* 32.0*  MCV 88.2   < > 85.1 86.6 84.6 82.8 85.3  PLT 263   < > 216 195 231 240 242   < > = values in this interval not displayed.    Basic Metabolic Panel: Recent Labs  Lab 11/11/21 1529 11/11/21 1851 11/12/21 0450 11/12/21 0819 11/12/21 1234 11/13/21 0407 11/13/21 2044 11/14/21 0709 11/15/21 1015 11/16/21 0535  NA 137   < >  --  136   < > 132* 141 138 136 137  K 3.3*   < >  --  4.1   < > 3.1* 4.1 4.0 3.2* 3.7  CL 100   < >  --  102   < > 102 107 103 99 98  CO2 20*   < >  --  22   < > '23 23 25 27 25  '$ GLUCOSE 386*   < >  --  173*   < > 225* 234* 261* 282* 217*  BUN 21   < >  --  28*   < > 35* '23 19 11 18  '$ CREATININE 1.68*   < >  --  1.34*   < > 1.34* 1.07* 0.99 0.93 1.00  CALCIUM 9.1   < >  --  8.4*   < > 7.5* 8.8* 8.8* 9.1 9.5  MG 1.3*  --  2.2 2.4  --   --   --  2.0  --  2.0  PHOS 1.3*  --  3.9 4.6  --   --   --   --   --   --    < > = values in this interval not displayed.    GFR: Estimated Creatinine Clearance: 63.8 mL/min (by C-G formula based on SCr of 1 mg/dL). Liver Function Tests: Recent Labs  Lab 11/11/21 0525 11/14/21 0709 11/16/21 0535  AST '21 28 29  '$ ALT '18 22 29  '$ ALKPHOS 94 60 56  BILITOT 0.6 0.9 0.7  PROT 7.8 6.7 6.2*  ALBUMIN 4.1 3.2* 2.9*    No results for input(s): "LIPASE", "AMYLASE" in the last 168  hours. Recent Labs  Lab 11/11/21 0800  AMMONIA 15    Coagulation Profile: Recent Labs  Lab 11/11/21 0759  INR 1.1    Cardiac Enzymes: Recent Labs  Lab 11/11/21 1429  CKTOTAL 86    BNP (last 3 results) No results for input(s): "PROBNP" in the last 8760 hours. HbA1C: No results for input(s): "HGBA1C" in the last 72 hours. CBG: Recent Labs  Lab 11/15/21 1125 11/15/21 1624 11/15/21 2203 11/15/21 2256 11/16/21 0617  GLUCAP 178*  315* 456* 399* 230*    Lipid Profile: Recent Labs    11/15/21 1015  TRIG 124   Thyroid Function Tests: No results for input(s): "TSH", "T4TOTAL", "FREET4", "T3FREE", "THYROIDAB" in the last 72 hours. Anemia Panel: Recent Labs    11/16/21 0535  VITAMINB12 640   Sepsis Labs: Recent Labs  Lab 11/11/21 0759 11/11/21 0800 11/11/21 1033 11/11/21 1528 11/12/21 0354  PROCALCITON <0.10  --   --   --   --   LATICACIDVEN  --  3.0* 5.9* 7.7* 2.6*     Recent Results (from the past 240 hour(s))  Culture, blood (single)     Status: None   Collection Time: 11/11/21  6:59 AM   Specimen: BLOOD RIGHT HAND  Result Value Ref Range Status   Specimen Description BLOOD RIGHT HAND  Final   Special Requests   Final    BOTTLES DRAWN AEROBIC AND ANAEROBIC Blood Culture adequate volume   Culture   Final    NO GROWTH 5 DAYS Performed at [facility], [place] [address] [place], [place] [address]    Report Status 11/16/2021 FINAL  Final  Urine Culture     Status: None   Collection Time: 11/11/21  8:00 AM   Specimen: Urine, Clean Catch  Result Value Ref Range Status   Specimen Description URINE, CLEAN CATCH  Final   Special Requests NONE  Final   Culture   Final    NO GROWTH Performed at [facility], [place] [address], [place], [place] [address]    Report Status 11/12/2021 FINAL  Final  Culture, blood (Routine X 2) w Reflex to ID Panel     Status: None   Collection Time: 11/11/21 10:33 AM   Specimen: BLOOD RIGHT HAND  Result Value  Ref Range Status   Specimen Description BLOOD RIGHT HAND  Final   Special Requests   Final    BOTTLES DRAWN AEROBIC ONLY Blood Culture adequate volume   Culture   Final    NO GROWTH 5 DAYS Performed at [facility], [place] [address] [place], [place] [address]    Report Status 11/16/2021 FINAL  Final  CSF culture w Gram Stain     Status: None   Collection Time: 11/11/21 10:52 AM   Specimen: CSF; Cerebrospinal Fluid  Result Value Ref Range Status   Specimen Description CSF  Final   Special Requests NONE  Final   Gram Stain   Final    WBC PRESENT, PREDOMINANTLY PMN NO ORGANISMS SEEN CYTOSPIN SMEAR    Culture   Final    NO GROWTH 3 DAYS Performed at [facility], [place] [address] [place], [place] [address]    Report Status 11/14/2021 FINAL  Final  VZV PCR, CSF     Status: None   Collection Time: 11/11/21 10:52 AM   Specimen: Lumbar Puncture; Cerebrospinal Fluid  Result Value Ref Range Status   VZV PCR, CSF Negative Negative Final    Comment: (NOTE) No Varicella Zoster Virus DNA detected. Performed At: [facility] [place], [place] [id number] Rush Farmer MD AS:[id number]   Culture, Respiratory w Gram Stain     Status: None   Collection Time: 11/11/21 10:54 AM   Specimen: Tracheal Aspirate; Respiratory  Result Value Ref Range Status   Specimen Description TRACHEAL ASPIRATE  Final   Special Requests NONE  Final   Gram Stain   Final    RARE WBC PRESENT,BOTH PMN AND MONONUCLEAR  FEW GRAM POSITIVE COCCI IN CLUSTERS    Culture   Final    RARE Normal respiratory flora-no Staph aureus or Pseudomonas seen Performed at [facility], [address], [place], [place] [address]    Report Status 11/13/2021 FINAL  Final  Fungus Culture With Stain     Status: Abnormal (Preliminary result)   Collection Time: 11/11/21 10:57 AM   Specimen: Lumbar Puncture; Cerebrospinal Fluid  Result Value Ref Range Status   Fungus Stain Final report (A)  Final     Comment: (NOTE) Performed At: [facility] [place], [place] [id number] Rush Farmer MD [id number]    Fungus (Mycology) Culture PENDING  Incomplete   Fungal Source TRACHEAL ASPIRATE  Final    Comment: Performed at [facility], [place] [address], [place], [place] [address]  Fungus Culture Result     Status: Abnormal   Collection Time: 11/11/21 10:57 AM  Result Value Ref Range Status   Result 1 Yeast observed (A)  Final    Comment: (NOTE) Performed At: [facility] [address] [place], [place] [id number] Rush Farmer MD PO:[id number]   Culture, blood (Routine X 2) w Reflex to ID Panel     Status: None   Collection Time: 11/11/21 12:51 PM   Specimen: BLOOD LEFT HAND  Result Value Ref Range Status   Specimen Description BLOOD LEFT HAND  Final   Special Requests IN PEDIATRIC BOTTLE Blood Culture adequate volume  Final   Culture   Final    NO GROWTH 5 DAYS Performed at [facility], [place] [address], [place],  [address]    Report Status 11/16/2021 FINAL  Final  MRSA Next Gen by PCR, Nasal     Status: None   Collection Time: 11/11/21  6:52 PM   Specimen: Nasal Mucosa; Nasal Swab  Result Value Ref Range Status   MRSA by PCR Next Gen NOT DETECTED NOT DETECTED Final    Comment: (NOTE) The GeneXpert MRSA Assay (FDA approved for NASAL specimens only), is one component of a comprehensive MRSA colonization surveillance program. It is not intended to diagnose MRSA infection nor to guide or monitor treatment for MRSA infections. Test performance is not FDA approved in patients less than 18 years old. Performed at [facility], [place] [address], [place],  [address]          Radiology Studies: No results found.      Scheduled Meds:  amLODipine  10 mg Oral Daily   aspirin  81 mg Oral Daily   atorvastatin  40 mg Oral Daily   Chlorhexidine Gluconate Cloth  6 each Topical Daily   docusate sodium  100 mg Oral  BID   feeding supplement  1 Container Oral TID BM   folic acid  1 mg Oral Daily   heparin  5,000 Units Subcutaneous Q8H   hydrochlorothiazide  25 mg Oral Daily   insulin aspart  0-20 Units Subcutaneous TID WC   insulin aspart  0-5 Units Subcutaneous QHS   insulin detemir  20 Units Subcutaneous BID   losartan  100 mg Oral Daily   melatonin  3 mg Oral QHS   pantoprazole  40 mg Oral Daily   polyethylene glycol  17 g Per Tube Daily   sodium chloride flush  10-40 mL Intracatheter Q12H   sodium chloride flush  3 mL Intravenous Once   Continuous Infusions:  thiamine (VITAMIN B1) injection 250 mg (11/16/21 1011)  Aline August, MD [facility] 11/16/2021, 10:50 AM

## 2021-11-16 NOTE — Progress Notes (Signed)
Inpatient Rehab Admissions Coordinator:  Consult received. Discussed pt with Dr. Marciano Sequin. He feels pt may not be an appropriate candidate for CIR at this time. However, he did advise to follow pt's medical workup and progress with therapies for a few days. Would appreciate an OT evaluation.    Gayland Curry, [place], [place] Admissions Coordinator [phone]

## 2021-11-17 ENCOUNTER — Other Ambulatory Visit: Payer: Self-pay

## 2021-11-17 LAB — GLUCOSE, CAPILLARY
Glucose-Capillary: 236 mg/dL — ABNORMAL HIGH (ref 70–99)
Glucose-Capillary: 446 mg/dL — ABNORMAL HIGH (ref 70–99)
Glucose-Capillary: 258 mg/dL — ABNORMAL HIGH (ref 70–99)
Glucose-Capillary: 155 mg/dL — ABNORMAL HIGH (ref 70–99)

## 2021-11-17 MED ORDER — INSULIN DETEMIR 100 UNIT/ML ~~LOC~~ SOLN
25.0000 [IU] | Freq: Two times a day (BID) | SUBCUTANEOUS | Status: DC
  Administered 2021-11-17 – 2021-11-18 (×2): 25 [IU] via SUBCUTANEOUS
  Filled 2021-11-17 (×3): qty 0.25

## 2021-11-17 MED ORDER — MENTHOL 3 MG MT LOZG
1.0000 | LOZENGE | Freq: Four times a day (QID) | OROMUCOSAL | Status: DC | PRN
  Filled 2021-11-17: qty 9

## 2021-11-17 MED ORDER — INSULIN ASPART 100 UNIT/ML IJ SOLN
22.0000 [IU] | Freq: Once | INTRAMUSCULAR | Status: AC
  Administered 2021-11-17: 22 [IU] via SUBCUTANEOUS

## 2021-11-17 MED ORDER — CHLORHEXIDINE GLUCONATE CLOTH 2 % EX PADS
6.0000 | MEDICATED_PAD | Freq: Every day | CUTANEOUS | Status: DC
  Administered 2021-11-17 – 2021-11-18 (×2): 6 via TOPICAL

## 2021-11-17 MED ORDER — INSULIN ASPART 100 UNIT/ML IJ SOLN
3.0000 [IU] | Freq: Three times a day (TID) | INTRAMUSCULAR | Status: DC
  Administered 2021-11-17 – 2021-11-18 (×4): 3 [IU] via SUBCUTANEOUS

## 2021-11-17 NOTE — Progress Notes (Signed)
PROGRESS NOTE    KENZLIE DISCH  LXB:[id number] DOB: 03/30/1950 DOA: 11/11/2021 PCP: Buzzy Han, MD (Inactive)   Brief Narrative:  71 year old female with past medical history of HSV, hyperlipidemia, CAD, diabetes mellitus type 2 presented with altered mental status and was initially hypertensive requiring intubation and sedation and admission to ICU.  She was found to be in DKA, treated with IV fluids/insulin drip and subsequently transitioned to long-acting insulin.  Neurology was consulted.  MRI was negative for acute stroke; EEG negative for seizures; LP was inconclusive.  She was extubated on 11/13/2021.  She was transferred to [facility] service from 11/15/2021 onwards.  Assessment & Plan:   Acute metabolic encephalopathy -Work-up so far has been negative including MRI brain/EEG with LP being inconclusive.  Ammonia and TSH normal.  Neurology signed off on 11/15/2021.  Neurology not worried about HSV encephalitis since HSV PCR was negative; awaiting results of autoimmune encephalitis panel. -Mental status improving but still slow to respond.  Slightly confused to time.  Currently on high dose thiamine: Might be concern for Warnicke's encephalopathy -Fall precautions.  Monitor mental status. -B12 level 640.  Continue empiric folic acid supplementation.  Acute respiratory failure with hypoxia -Possibly due to mental status abnormality.  Intubated on presentation and subsequently extubated on 11/13/2021.  Transferred to [facility] service from 11/15/2020 onwards.  Currently on room air.  DKA Diabetes mellitus type 2 with hyperglycemia -A1c 7.4.  Has already been transitioned to long-acting insulin.  Blood sugars still elevated but improving.  Continue long-acting insulin and CBGs with SSI.  Diabetes coordinator following.  Continue carb modified diet.  Metformin on hold  Hypertension -- Blood pressure much improved.  Continue amlodipine, hydrochlorothiazide and losartan.    Acute kidney  injury -Resolved.  Creatinine stable  Leukocytosis -Resolved.  No labs today.  Hypokalemia -Improved.  No labs today.  Anemia of chronic disease -Questionable cause.  Hemoglobin stable.  Monitor intermittently  CAD -Stable.  No chest pain.  Continue aspirin and statin  Diabetic neuropathy -gabapentin on hold.  Mental status still does not allow to resume gabapentin.  Obesity -Outpatient follow-up  Physical deconditioning -PT recommended CIR. CIR consulted.  Consult OT.  Will also consult TOC in case patient needs SNF placement.     DVT prophylaxis: Heparin Code Status: Full Family Communication: Grandson at bedside on 11/15/2021.  None at bedside today Disposition Plan: Status is: Inpatient Remains inpatient appropriate because: Of severity of illness.  Need for CIR placement  Consultants: PCCM/neurology  Procedures: EEG/LP  Antimicrobials: None   Subjective: Patient seen and examined at bedside.  Awake, answers some questions, still confused to time.  No fever, vomiting, seizures or agitation reported. Objective: Vitals:   11/16/21 1935 11/16/21 2326 11/17/21 0319 11/17/21 0500  BP: (!) 175/64 (!) 167/64 (!) 161/69 (!) 151/52  Pulse: 80 88 83   Resp: '17 17 17   '$ Temp: 99.4 F (37.4 C) 98.3 F (36.8 C) 98 F (36.7 C)   TempSrc: Oral Oral Oral   SpO2: 100% 96% 97%   Weight:      Height:        Intake/Output Summary (Last 24 hours) at 11/17/2021 0755 Last data filed at 11/16/2021 0952 Gross per 24 hour  Intake 10 ml  Output --  Net 10 ml    Filed Weights   11/14/21 0500 11/14/21 2041 11/16/21 0500  Weight: 100.2 kg 99.3 kg 98.9 kg    Examination:  General: No acute distress.  On room air currently. ENT/neck:  No obvious neck masses or JVD elevation noted. respiratory: Decreased breath sounds at bases with some scattered crackles CVS: Mostly rate controlled; S1 and S2 are heard Abdominal: Soft, nontender, distended slightly; no organomegaly,   Sounds are normally Extremities: No; mild lower extremity edema present  CNS: Awake, still extremely slow to respond but answers some questions but still confused to time. No focal neurologic deficit.  Able to move extremities lymph: No obvious lymphadenopathy Skin: No obvious petechiae/rashes  psych: Intermittently smiling.  No signs of agitation.   Musculoskeletal: No obvious joint swelling/deformity   Data Reviewed: I have personally reviewed following labs and imaging studies  CBC: Recent Labs  Lab 11/11/21 0525 11/11/21 0800 11/12/21 0450 11/13/21 0407 11/14/21 0709 11/15/21 1015 11/16/21 0535  WBC 9.9   < > 15.9* 12.8* 10.0 7.7 7.2  NEUTROABS 7.2  --   --  9.8* 7.5 5.1 4.3  HGB 13.7  13.9   < > 10.1* 9.5* 10.4* 10.8* 10.7*  HCT 42.0  41.0   < > 29.6* 27.1* 30.8* 31.4* 32.0*  MCV 88.2   < > 85.1 86.6 84.6 82.8 85.3  PLT 263   < > 216 195 231 240 242   < > = values in this interval not displayed.    Basic Metabolic Panel: Recent Labs  Lab 11/11/21 1529 11/11/21 1851 11/12/21 0450 11/12/21 0819 11/12/21 1234 11/13/21 0407 11/13/21 2044 11/14/21 0709 11/15/21 1015 11/16/21 0535  NA 137   < >  --  136   < > 132* 141 138 136 137  K 3.3*   < >  --  4.1   < > 3.1* 4.1 4.0 3.2* 3.7  CL 100   < >  --  102   < > 102 107 103 99 98  CO2 20*   < >  --  22   < > '23 23 25 27 25  '$ GLUCOSE 386*   < >  --  173*   < > 225* 234* 261* 282* 217*  BUN 21   < >  --  28*   < > 35* '23 19 11 18  '$ CREATININE 1.68*   < >  --  1.34*   < > 1.34* 1.07* 0.99 0.93 1.00  CALCIUM 9.1   < >  --  8.4*   < > 7.5* 8.8* 8.8* 9.1 9.5  MG 1.3*  --  2.2 2.4  --   --   --  2.0  --  2.0  PHOS 1.3*  --  3.9 4.6  --   --   --   --   --   --    < > = values in this interval not displayed.    GFR: Estimated Creatinine Clearance: 63.8 mL/min (by C-G formula based on SCr of 1 mg/dL). Liver Function Tests: Recent Labs  Lab 11/11/21 0525 11/14/21 0709 11/16/21 0535  AST '21 28 29  '$ ALT '18 22 29   '$ ALKPHOS 94 60 56  BILITOT 0.6 0.9 0.7  PROT 7.8 6.7 6.2*  ALBUMIN 4.1 3.2* 2.9*    No results for input(s): "LIPASE", "AMYLASE" in the last 168 hours. Recent Labs  Lab 11/11/21 0800  AMMONIA 15    Coagulation Profile: Recent Labs  Lab 11/11/21 0759  INR 1.1    Cardiac Enzymes: Recent Labs  Lab 11/11/21 1429  CKTOTAL 86    BNP (last 3 results) No results for input(s): "PROBNP" in the last 8760 hours. HbA1C: No results for  input(s): "HGBA1C" in the last 72 hours. CBG: Recent Labs  Lab 11/16/21 0617 11/16/21 1157 11/16/21 1632 11/16/21 2109 11/17/21 0606  GLUCAP 230* 253* 246* 255* 236*    Lipid Profile: Recent Labs    11/15/21 1015  TRIG 124    Thyroid Function Tests: No results for input(s): "TSH", "T4TOTAL", "FREET4", "T3FREE", "THYROIDAB" in the last 72 hours. Anemia Panel: Recent Labs    11/16/21 0535  VITAMINB12 640    Sepsis Labs: Recent Labs  Lab 11/11/21 0759 11/11/21 0800 11/11/21 1033 11/11/21 1528 11/12/21 0354  PROCALCITON <0.10  --   --   --   --   LATICACIDVEN  --  3.0* 5.9* 7.7* 2.6*     Recent Results (from the past 240 hour(s))  Culture, blood (single)     Status: None   Collection Time: 11/11/21  6:59 AM   Specimen: BLOOD RIGHT HAND  Result Value Ref Range Status   Specimen Description BLOOD RIGHT HAND  Final   Special Requests   Final    BOTTLES DRAWN AEROBIC AND ANAEROBIC Blood Culture adequate volume   Culture   Final    NO GROWTH 5 DAYS Performed at [facility], [place] [address] [place], [place] [address]    Report Status 11/16/2021 FINAL  Final  Urine Culture     Status: None   Collection Time: 11/11/21  8:00 AM   Specimen: Urine, Clean Catch  Result Value Ref Range Status   Specimen Description URINE, CLEAN CATCH  Final   Special Requests NONE  Final   Culture   Final    NO GROWTH Performed at [facility], [place] [address] [place], [place] [address]    Report Status 11/12/2021 FINAL   Final  Culture, blood (Routine X 2) w Reflex to ID Panel     Status: None   Collection Time: 11/11/21 10:33 AM   Specimen: BLOOD RIGHT HAND  Result Value Ref Range Status   Specimen Description BLOOD RIGHT HAND  Final   Special Requests   Final    BOTTLES DRAWN AEROBIC ONLY Blood Culture adequate volume   Culture   Final    NO GROWTH 5 DAYS Performed at [facility], [place] [address], [place], [place] [address]    Report Status 11/16/2021 FINAL  Final  CSF culture w Gram Stain     Status: None   Collection Time: 11/11/21 10:52 AM   Specimen: CSF; Cerebrospinal Fluid  Result Value Ref Range Status   Specimen Description CSF  Final   Special Requests NONE  Final   Gram Stain   Final    WBC PRESENT, PREDOMINANTLY PMN NO ORGANISMS SEEN CYTOSPIN SMEAR    Culture   Final    NO GROWTH 3 DAYS Performed at [facility], [place] [address] [place],  [address]    Report Status 11/14/2021 FINAL  Final  VZV PCR, CSF     Status: None   Collection Time: 11/11/21 10:52 AM   Specimen: Lumbar Puncture; Cerebrospinal Fluid  Result Value Ref Range Status   VZV PCR, CSF Negative Negative Final    Comment: (NOTE) No Varicella Zoster Virus DNA detected. Performed At: [facility] [place], [place] [id number] Rush Farmer MD [id number]   Culture, Respiratory w Gram Stain     Status: None   Collection Time: 11/11/21 10:54 AM   Specimen: Tracheal Aspirate; Respiratory  Result Value Ref Range Status   Specimen Description  TRACHEAL ASPIRATE  Final   Special Requests NONE  Final   Gram Stain   Final    RARE WBC PRESENT,BOTH PMN AND MONONUCLEAR FEW GRAM POSITIVE COCCI IN CLUSTERS    Culture   Final    RARE Normal respiratory flora-no Staph aureus or Pseudomonas seen Performed at [facility], [address], [place], [place] [address]    Report Status 11/13/2021 FINAL  Final  Fungus Culture With Stain     Status: Abnormal (Preliminary result)    Collection Time: 11/11/21 10:57 AM   Specimen: Lumbar Puncture; Cerebrospinal Fluid  Result Value Ref Range Status   Fungus Stain Final report (A)  Final    Comment: (NOTE) Performed At: [facility] [place], [place] [id number] Rush Farmer MD [id number]    Fungus (Mycology) Culture PENDING  Incomplete   Fungal Source TRACHEAL ASPIRATE  Final    Comment: Performed at [facility], [place] [address], [place], [place] [address]  Fungus Culture Result     Status: Abnormal   Collection Time: 11/11/21 10:57 AM  Result Value Ref Range Status   Result 1 Yeast observed (A)  Final    Comment: (NOTE) Performed At: [facility] [address] [place], [place] [id number] Rush Farmer MD [id number]   Culture, blood (Routine X 2) w Reflex to ID Panel     Status: None   Collection Time: 11/11/21 12:51 PM   Specimen: BLOOD LEFT HAND  Result Value Ref Range Status   Specimen Description BLOOD LEFT HAND  Final   Special Requests IN PEDIATRIC BOTTLE Blood Culture adequate volume  Final   Culture   Final    NO GROWTH 5 DAYS Performed at [facility], [place] [address] [place], [place] [address]    Report Status 11/16/2021 FINAL  Final  MRSA Next Gen by PCR, Nasal     Status: None   Collection Time: 11/11/21  6:52 PM   Specimen: Nasal Mucosa; Nasal Swab  Result Value Ref Range Status   MRSA by PCR Next Gen NOT DETECTED NOT DETECTED Final    Comment: (NOTE) The GeneXpert MRSA Assay (FDA approved for NASAL specimens only), is one component of a comprehensive MRSA colonization surveillance program. It is not intended to diagnose MRSA infection nor to guide or monitor treatment for MRSA infections. Test performance is not FDA approved in patients less than 38 years old. Performed at [facility], [place] [address], [place], [place] [address]          Radiology Studies: No results found.      Scheduled Meds:  amLODipine  10 mg Oral  Daily   aspirin  81 mg Oral Daily   atorvastatin  40 mg Oral Daily   Chlorhexidine Gluconate Cloth  6 each Topical Daily   docusate sodium  100 mg Oral BID   feeding supplement  1 Container Oral TID BM   folic acid  1 mg Oral Daily   heparin  5,000 Units Subcutaneous Q8H   hydrochlorothiazide  25 mg Oral Daily   insulin aspart  0-20 Units Subcutaneous TID WC   insulin aspart  0-5 Units Subcutaneous QHS   insulin detemir  20 Units Subcutaneous BID   losartan  100 mg Oral Daily   melatonin  3 mg Oral QHS   pantoprazole  40 mg Oral Daily   polyethylene glycol  17 g Per Tube Daily   sodium chloride flush  10-40 mL  Intracatheter Q12H   sodium chloride flush  3 mL Intravenous Once   Continuous Infusions:  thiamine (VITAMIN B1) injection 250 mg (11/16/21 1011)          Aline August, MD [facility] 11/17/2021, 7:55 AM

## 2021-11-17 NOTE — Progress Notes (Addendum)
Physical Therapy Treatment Patient Details Name: Victoria Jimenez MRN: [id number] DOB: 09/14/1950 Today's Date: 11/17/2021   History of Present Illness 71 year old female presented with altered mental status and was initially hypertensive requiring intubation and sedation and admission to ICU.  She was found to be in DKA, treated with IV fluids/insulin drip and subsequently transitioned to long-acting insulin.  Neurology was consulted.  MRI was negative for acute stroke; EEG negative for seizures; LP was inconclusive.  She was extubated on 11/13/2021 and transferred to floor unit 10/7. PMH:  HSV, hyperlipidemia, CAD, diabetes mellitus type 2.    PT Comments    Pt received in supine, sleeping but easily awoken and pleasantly agreeable to therapy session, pt spouse present and reporting he is able to provide 24/7 supervision/assist for her at home. Pt making good progress toward goals, requiring mostly Supervision and only intermittent min guard for safety using RW for standing balance/gait tasks. Anticipate pt safe to DC home with max HH services and neuro PT focus, may also benefit from progression to neuro OPPT depending on progress with HH. Disposition discussed with pt, spouse and supervising PT Ginger Carne and updated below, case mgr also notified. Pt continues to benefit from PT services to progress toward functional mobility goals.    Recommendations for follow up therapy are one component of a multi-disciplinary discharge planning process, led by the attending physician.  Recommendations may be updated based on patient status, additional functional criteria and insurance authorization.  Follow Up Recommendations  Home health PT     Assistance Recommended at Discharge Frequent or constant Supervision/Assistance  Patient can return home with the following A little help with walking and/or transfers;A little help with bathing/dressing/bathroom;Assistance with cooking/housework;Assist for  transportation;Help with stairs or ramp for entrance   Equipment Recommendations  Rolling walker (2 wheels)  (Pt reports she has an old one from family "outside")  Recommendations for Other Services       Precautions / Restrictions Precautions Precautions: Fall Restrictions Weight Bearing Restrictions: No     Mobility  Bed Mobility Overal bed mobility: Needs Assistance Bed Mobility: Supine to Sit     Supine to sit: Supervision     General bed mobility comments: increased time, no physical assist required, HOB flat/no rails    Transfers Overall transfer level: Needs assistance Equipment used: Rolling walker (2 wheels) Transfers: Sit to/from Stand Sit to Stand: Supervision           General transfer comment: min cues for safety; from EOB<>RW    Ambulation/Gait Ambulation/Gait assistance: Supervision, Min guard Gait Distance (Feet): 220 Feet Assistive device: Rolling walker (2 wheels) Gait Pattern/deviations: Step-through pattern       General Gait Details: Pt fairly steady with RW, initially provided min guard but no LOB, pt catching front R corner of RW on objects in hallway x2 events but pt aware and able to correct RW placement without cueing.   Stairs Stairs:  (pt and spouse both report she will not need to perform stairs at home or apts and has ramp for entry)              Balance Overall balance assessment: Needs assistance Sitting-balance support: Feet supported, No upper extremity supported Sitting balance-Leahy Scale: Good Sitting balance - Comments: supervision/stand-by for EOB seated tasks   Standing balance support: Bilateral upper extremity supported, During functional activity Standing balance-Leahy Scale: Fair Standing balance comment: RW for hallway ambulation  Cognition Arousal/Alertness: Awake/alert Behavior During Therapy: WFL for tasks assessed/performed, Restless Overall Cognitive Status:  Impaired/Different from baseline Area of Impairment: Orientation, Attention, Memory, Following commands, Awareness, Safety/judgement, Problem solving                 Orientation Level: Disoriented to, Time Current Attention Level: Sustained Memory: Decreased short-term memory Following Commands: Follows one step commands with increased time, Follows multi-step commands inconsistently, Follows one step commands consistently Safety/Judgement: Decreased awareness of safety, Decreased awareness of deficits Awareness: Emergent Problem Solving: Slow processing, Difficulty sequencing, Requires verbal cues General Comments: She reports correct date and seeming oriented to situation; pt able to wayfind in hallway to locate her room but does occasionally catch R corner of RW wheels on objects in hallway, pt denies blurry vision or double vision.        Exercises      General Comments General comments (skin integrity, edema, etc.): VSS on RA, pt denies dizziness and BP not further assessed      Pertinent Vitals/Pain Pain Assessment Pain Assessment: No/denies pain Faces Pain Scale: No hurt Pain Intervention(s): Monitored during session     PT Goals (current goals can now be found in the care plan section) Acute Rehab PT Goals Patient Stated Goal: to go home PT Goal Formulation: Patient unable to participate in goal setting Time For Goal Achievement: 11/28/21 Progress towards PT goals: Progressing toward goals    Frequency    Min 3X/week      PT Plan Discharge plan needs to be updated;Equipment recommendations need to be updated       AM-PAC PT "6 Clicks" Mobility   Outcome Measure  Help needed turning from your back to your side while in a flat bed without using bedrails?: None Help needed moving from lying on your back to sitting on the side of a flat bed without using bedrails?: A Little Help needed moving to and from a bed to a chair (including a wheelchair)?: A  Little Help needed standing up from a chair using your arms (e.g., wheelchair or bedside chair)?: A Little Help needed to walk in hospital room?: A Little Help needed climbing 3-5 steps with a railing? : A Little 6 Click Score: 19    End of Session Equipment Utilized During Treatment: Gait belt Activity Tolerance: Patient tolerated treatment well Patient left: in bed;with call bell/phone within reach;with bed alarm set;Other (comment) (bed up in chair posture (pt reports she sat up in recliner earlier)) Nurse Communication: Mobility status PT Visit Diagnosis: Unsteadiness on feet (R26.81);Other abnormalities of gait and mobility (R26.89);Other symptoms and signs involving the nervous system (R29.898)     Time: 9357-0177 PT Time Calculation (min) (ACUTE ONLY): 14 min  Charges:  $Gait Training: 8-22 mins                     Wilbur Oakland P., PTA Acute Rehabilitation Services Secure Chat Preferred 9a-5:30pm Office: [place] 11/17/2021, 3:01 PM

## 2021-11-17 NOTE — Care Management Important Message (Signed)
Important Message  Patient Details  Name: Victoria Jimenez MRN: [id number] Date of Birth: 1950/09/03   Medicare Important Message Given:  Yes     Orbie Pyo 11/17/2021, 3:51 PM

## 2021-11-17 NOTE — Progress Notes (Signed)
Inpatient Rehab Admissions Coordinator:    Discussed CIR with Pt. And husband and let them know insurance is unlikely to approve CIR for her diagnoses, particularly now that OT is recommending HH. Pt. Prefers home with home health. I will not pursue CIR for her. TOC aware.   Clemens Catholic, [place], [place] Admissions Coordinator  [phone] ([place]) [phone] (office)

## 2021-11-17 NOTE — Evaluation (Signed)
Occupational Therapy Evaluation Patient Details Name: Victoria Jimenez MRN: [id number] DOB: 1950-02-26 Today's Date: 11/17/2021   History of Present Illness Pt is a 71 y/o female admitted to the ED 10/3 with fever, HTN, AMS, stroke-like symptom and requiring intubation and sedation.  Working dx of encephalopathy of unknown etiology  at this point.  Work up continues.   Extubated 10/5  PMHx  CAD with CATH, DM, HTN, OSA   Clinical Impression   PTA patient independent and driving. Admitted for above and presents with problem list below, including impaired balance, decreased activity tolerance, impaired cognition and generalized weakness. Pt pleasant and cooperative, follows simple commands with increased time but difficulty with multiple step commands without cueing, problem solving, recall and attention.  She is disoriented to date (but able to voiced month, year, day of week), only able to recall 1/3 words after delay.  She completes bed mobility, transfers and ADLs with up to min guard assist using RW.  Educated on safety, recommendations for 24/7 support and assist with meds, meals and driving at this time.  Plan for DC home with spouse when appropriate if he can provide 24/7.  Will follow acutely.      Recommendations for follow up therapy are one component of a multi-disciplinary discharge planning process, led by the attending physician.  Recommendations may be updated based on patient status, additional functional criteria and insurance authorization.   Follow Up Recommendations  Home health OT    Assistance Recommended at Discharge Frequent or constant Supervision/Assistance  Patient can return home with the following A little help with walking and/or transfers;A little help with bathing/dressing/bathroom;Assistance with cooking/housework;Direct supervision/assist for medications management;Direct supervision/assist for financial management;Assist for transportation;Help with stairs or  ramp for entrance    Functional Status Assessment  Patient has had a recent decline in their functional status and demonstrates the ability to make significant improvements in function in a reasonable and predictable amount of time.  Equipment Recommendations  BSC/3in1    Recommendations for Other Services       Precautions / Restrictions Precautions Precautions: Fall Restrictions Weight Bearing Restrictions: No      Mobility Bed Mobility Overal bed mobility: Needs Assistance Bed Mobility: Supine to Sit     Supine to sit: Supervision     General bed mobility comments: increased time, no physical assist required    Transfers Overall transfer level: Needs assistance   Transfers: Sit to/from Stand Sit to Stand: Min guard           General transfer comment: for safety, mild instability      Balance Overall balance assessment: Needs assistance Sitting-balance support: Feet supported, No upper extremity supported Sitting balance-Leahy Scale: Good Sitting balance - Comments: dynamically during ADLs with supervision   Standing balance support: Bilateral upper extremity supported, During functional activity, No upper extremity supported Standing balance-Leahy Scale: Fair Standing balance comment: BUE dynamically but able to engage in grooming without UE support                           ADL either performed or assessed with clinical judgement   ADL Overall ADL's : Needs assistance/impaired     Grooming: Min guard;Standing;Wash/dry hands           Upper Body Dressing : Sitting;Set up   Lower Body Dressing: Min guard;Sit to/from stand   Toilet Transfer: Min guard;Ambulation;Rolling walker (2 wheels)   Toileting- Clothing Manipulation and Hygiene: Min guard;Sit to/from  stand       Functional mobility during ADLs: Min guard;Rolling walker (2 wheels);Cueing for safety       Vision Baseline Vision/History: 1 Wears glasses Ability to See in  Adequate Light: 0 Adequate Patient Visual Report: No change from baseline Vision Assessment?: No apparent visual deficits Additional Comments: brief assessment with [facility]     Perception     Praxis      Pertinent Vitals/Pain Pain Assessment Pain Assessment: Faces Faces Pain Scale: No hurt Pain Intervention(s): Monitored during session     Hand Dominance Right   Extremity/Trunk Assessment Upper Extremity Assessment Upper Extremity Assessment: Generalized weakness (at shoulders, hand strength 4/5)   Lower Extremity Assessment Lower Extremity Assessment: Defer to PT evaluation       Communication Communication Communication: No difficulties   Cognition Arousal/Alertness: Awake/alert Behavior During Therapy: WFL for tasks assessed/performed, Restless Overall Cognitive Status: Impaired/Different from baseline Area of Impairment: Orientation, Attention, Memory, Following commands, Awareness, Safety/judgement, Problem solving                 Orientation Level: Disoriented to, Time Current Attention Level: Sustained Memory: Decreased short-term memory Following Commands: Follows one step commands with increased time, Follows multi-step commands inconsistently, Follows one step commands consistently Safety/Judgement: Decreased awareness of safety, Decreased awareness of deficits Awareness: Emergent Problem Solving: Slow processing, Difficulty sequencing, Requires verbal cues General Comments: pt reports october 11th (unable to recall after several minutes), recall 1/3 words with delay.  Pt with increased time to determine days till her birthday and reports she will be 75 this year (when she is turning 52). Continue assessment.     General Comments  VSS on RA    Exercises     Shoulder Instructions      Home Living Family/patient expects to be discharged to:: Private residence Living Arrangements: Spouse/significant other Available Help at Discharge: Family;Available  24 hours/day Type of Home: House Home Access: Stairs to enter [employer] of Steps: 2/6 Entrance Stairs-Rails: Right;Left Home Layout: One level     Bathroom Shower/Tub: Teacher, early years/pre: Standard     Home Equipment: Conservation officer, nature (2 wheels);Cane - single point;Shower seat          Prior Functioning/Environment Prior Level of Function : Independent/Modified Independent;Driving               ADLs Comments: independent ADls, IADLs, med mgmt        OT Problem List: Decreased strength;Decreased activity tolerance;Impaired balance (sitting and/or standing);Decreased cognition;Decreased safety awareness;Decreased knowledge of use of DME or AE;Decreased knowledge of precautions;Obesity      OT Treatment/Interventions: Self-care/ADL training;Therapeutic exercise;DME and/or AE instruction;Therapeutic activities;Cognitive remediation/compensation;Patient/family education;Balance training    OT Goals(Current goals can be found in the care plan section) Acute Rehab OT Goals Patient Stated Goal: home OT Goal Formulation: With patient Time For Goal Achievement: 12/01/21 Potential to Achieve Goals: Good  OT Frequency: Min 2X/week    Co-evaluation              AM-PAC OT "6 Clicks" Daily Activity     Outcome Measure Help from another person eating meals?: None Help from another person taking care of personal grooming?: A Little Help from another person toileting, which includes using toliet, bedpan, or urinal?: A Little Help from another person bathing (including washing, rinsing, drying)?: A Little Help from another person to put on and taking off regular upper body clothing?: A Little Help from another person to put on and taking off  regular lower body clothing?: A Little 6 Click Score: 19   End of Session Equipment Utilized During Treatment: Gait belt;Rolling walker (2 wheels) Nurse Communication: Mobility status  Activity Tolerance:  Patient tolerated treatment well Patient left: in chair;with call bell/phone within reach;with chair alarm set  OT Visit Diagnosis: Unsteadiness on feet (R26.81);Muscle weakness (generalized) (M62.81);Other symptoms and signs involving cognitive function                Time: 5176-1607 OT Time Calculation (min): 26 min Charges:  OT General Charges $OT Visit: 1 Visit OT Evaluation $OT Eval Moderate Complexity: 1 Mod OT Treatments $Self Care/Home Management : 8-22 mins  Jolaine Artist, OT Acute Rehabilitation Services Office [phone]   Victoria Jimenez 11/17/2021, 9:35 AM

## 2021-11-17 NOTE — Inpatient Diabetes Management (Signed)
Inpatient Diabetes Program Recommendations  AACE/ADA: New Consensus Statement on Inpatient Glycemic Control (2015)  Target Ranges:  Prepandial:   less than 140 mg/dL      Peak postprandial:   less than 180 mg/dL (1-2 hours)      Critically ill patients:  140 - 180 mg/dL   Lab Results  Component Value Date   GLUCAP 236 (H) 11/17/2021   HGBA1C 7.4 (H) 11/11/2021    Latest Reference Range & Units 11/16/21 06:17 11/16/21 11:57 11/16/21 16:32 11/16/21 21:09 11/17/21 06:06  Glucose-Capillary 70 - 99 mg/dL 230 (H) 253 (H) 246 (H) 255 (H) 236 (H)  (H): Data is abnormally high Review of Glycemic Control  Diabetes history: type 2 Outpatient Diabetes medications: Metformin 1000 mg BID Current orders for Inpatient glycemic control: Levemir 20 units BID, Novolog RESISTANT correction scale TID & HS scale  Inpatient Diabetes Program Recommendations:   Noted that blood sugars continue to be greater than 200 mg/dl.   Recommend adding Novolog 3 units TID with meals if patient eats at least 50% of meal and blood sugars continue to be elevated.   Harvel Ricks RN BSN CDE Diabetes Coordinator Pager: [phone]  8am-5pm

## 2021-11-17 NOTE — TOC Initial Note (Signed)
Transition of Care [facility]) - Initial/Assessment Note    Patient Details  Name: Victoria Jimenez MRN: [id number] Date of Birth: 1950/06/18  [facility] of [facility]) CM/SW Contact:    [name], RN Phone Number: 11/17/2021, 3:00 PM  Clinical Narrative:                 Pt is from home with her spouse. She was managing her own medications but spouse states he can oversee them at d/c. Spouse also able to provide needed transportation.  Home health services arranged through [facility]. Information on the AVS.  Per pt walker is old and has been outside. CM will order new walker for home.  Spouse will provide transport home at d/c.   Expected Discharge Plan: [place] Barriers to Discharge: Continued Medical Work up   Patient Goals and CMS Choice   CMS Medicare.gov Compare Post Acute Care list provided to:: Patient Choice offered to / list presented to : Patient, Spouse  Expected Discharge Plan and Services Expected Discharge Plan: [place]   Discharge Planning Services: CM Consult Post Acute Care Choice: [place] arrangements for the past 2 months: Single Family Home                           HH Arranged: PT, OT HH Agency: [place] Date [place]: 11/17/21   Representative spoke with at [place]: [name]  Prior Living Arrangements/Services Living arrangements for the past 2 months: [place] Lives with:: Spouse Patient language and need for interpreter reviewed:: Yes Do you feel safe going back to the place where you live?: Yes      Need for Family Participation in Patient Care: Yes (Comment) Care giver support system in place?: Yes (comment) Current home services: DME (cane/ walker/ shower seat) Criminal Activity/Legal Involvement Pertinent to Current Situation/Hospitalization: No - Comment as needed  Activities of Daily Living Home Assistive Devices/Equipment: None ADL  Screening (condition at time of admission) Patient's cognitive ability adequate to safely complete daily activities?: No Is the patient deaf or have difficulty hearing?: No Does the patient have difficulty seeing, even when wearing glasses/contacts?: No Does the patient have difficulty concentrating, remembering, or making decisions?: Yes Patient able to express need for assistance with ADLs?: No Does the patient have difficulty dressing or bathing?: Yes Independently performs ADLs?: No Communication: Dependent Is this a change from baseline?: Change from baseline, expected to last >3 days Dressing (OT): Dependent Is this a change from baseline?: Change from baseline, expected to last >3 days Grooming: Dependent Is this a change from baseline?: Change from baseline, expected to last >3 days Feeding: Dependent Is this a change from baseline?: Change from baseline, expected to last >3 days Bathing: Dependent Is this a change from baseline?: Change from baseline, expected to last >3 days Toileting: Dependent Is this a change from baseline?: Change from baseline, expected to last >3days In/Out Bed: Dependent Is this a change from baseline?: Change from baseline, expected to last >3 days Walks in Home: Dependent Is this a change from baseline?: Change from baseline, expected to last >3 days Does the patient have difficulty walking or climbing stairs?: Yes Weakness of Legs: Both Weakness of Arms/Hands: Both  Permission Sought/Granted                  Emotional Assessment Appearance:: Appears stated age Attitude/Demeanor/Rapport: Engaged Affect (typically observed):  Accepting Orientation: : Oriented to Self, Oriented to Place, Oriented to Situation   Psych Involvement: No (comment)  Admission diagnosis:  Hypertensive emergency [I16.1] Altered mental status, unspecified altered mental status type [E78.41] Acute metabolic encephalopathy [Q82.08] Patient Active Problem List    Diagnosis Date Noted   AKI (acute kidney injury) ([place]) 11/15/2021   Altered mental status    Acute metabolic encephalopathy 13/88/7195   Hypertensive emergency    Acute respiratory failure with hypoxia (HCC)    Hyperglycemia    Diabetic ketoacidosis without coma associated with type 2 diabetes mellitus ([place])    Sepsis ([place]) 09/01/2015   UTI (lower urinary tract infection) 09/01/2015   GERD (gastroesophageal reflux disease) 09/01/2015   Vaginal yeast infection 09/01/2015   Piriformis syndrome of left side 09/01/2015   Back pain 06/18/2014   Normal coronary arteries 2007 05/08/2014   Exertional dyspnea 05/08/2014   Chest pain with moderate risk of acute coronary syndrome 05/08/2014   PVD (peripheral vascular disease) ([place]) 05/08/2014   Obstructive sleep apnea 01/25/2013   Obesity (BMI 30-39.9) 01/25/2013   Hypertension    Insulin dependent diabetes mellitus ([place])    PCP:  Buzzy Han, MD (Inactive) Pharmacy:   [facility] [place] [place] - [address] [place] ([place]) [place] [address] Phone: [phone] Fax: [phone]  [facility] [place], [place] [address] [place] [address] Phone: [phone] Fax: [phone]  [facility] [place], [place] - [address] [place] [address] Phone: [phone] Fax: [phone]     Social Determinants of Health ([place]) Interventions    Readmission Risk Interventions     No data to display

## 2021-11-18 ENCOUNTER — Telehealth (HOSPITAL_COMMUNITY): Payer: Self-pay | Admitting: Pharmacy Technician

## 2021-11-18 ENCOUNTER — Other Ambulatory Visit (HOSPITAL_COMMUNITY): Payer: Self-pay

## 2021-11-18 LAB — GLUCOSE, CAPILLARY
Glucose-Capillary: 232 mg/dL — ABNORMAL HIGH (ref 70–99)
Glucose-Capillary: 328 mg/dL — ABNORMAL HIGH (ref 70–99)
Glucose-Capillary: 297 mg/dL — ABNORMAL HIGH (ref 70–99)

## 2021-11-18 MED ORDER — INSULIN PEN NEEDLE 32G X 4 MM MISC: 0 refills | Status: AC

## 2021-11-18 MED ORDER — BLOOD GLUCOSE MONITOR KIT: PACK | 0 refills | Status: AC

## 2021-11-18 MED ORDER — THIAMINE HCL 100 MG PO TABS: 100.0000 mg | ORAL_TABLET | Freq: Every day | ORAL | 0 refills | Status: AC

## 2021-11-18 MED ORDER — FOLIC ACID 1 MG PO TABS: 1.0000 mg | ORAL_TABLET | Freq: Every day | ORAL | 0 refills | Status: AC

## 2021-11-18 MED ORDER — AMLODIPINE BESYLATE 10 MG PO TABS: 10.0000 mg | ORAL_TABLET | Freq: Every day | ORAL | 0 refills | Status: AC

## 2021-11-18 MED ORDER — INSULIN GLARGINE 100 UNIT/ML SOLOSTAR PEN: 50.0000 [IU] | PEN_INJECTOR | Freq: Every day | SUBCUTANEOUS | 0 refills | Status: AC

## 2021-11-18 MED ORDER — INSULIN LISPRO (1 UNIT DIAL) 100 UNIT/ML (KWIKPEN): 8.0000 [IU] | PEN_INJECTOR | Freq: Three times a day (TID) | SUBCUTANEOUS | 0 refills | Status: AC

## 2021-11-18 NOTE — TOC Transition Note (Signed)
Transition of Care [facility]) - CM/SW Discharge Note   Patient Details  Name: Victoria Jimenez MRN: [id number] Date of Birth: 1950/06/11  [facility] of [facility]) CM/SW Contact:  [name], RN Phone Number: 11/18/2021, 1:40 PM   Clinical Narrative:    Pt is discharging home with home health services through [facility] health. Information on the AVS.  Pts spouse to provide supervision at home and transportation to home.    Final next level of care: Home w Home Health Services Barriers to Discharge: No Barriers Identified   Patient Goals and CMS Choice   CMS Medicare.gov Compare Post Acute Care list provided to:: Patient Choice offered to / list presented to : Patient, Spouse  Discharge Placement                       Discharge Plan and Services   Discharge Planning Services: CM Consult Post Acute Care Choice: Home Health                    HH Arranged: PT, OT [facility] Agency: [place] Date [place]: 11/17/21   Representative spoke with at [place]: [name]  Social Determinants of Health ([place]) Interventions     Readmission Risk Interventions     No data to display

## 2021-11-18 NOTE — Inpatient Diabetes Management (Signed)
Inpatient Diabetes Program Recommendations  AACE/ADA: New Consensus Statement on Inpatient Glycemic Control (2015)  Target Ranges:  Prepandial:   less than 140 mg/dL      Peak postprandial:   less than 180 mg/dL (1-2 hours)      Critically ill patients:  140 - 180 mg/dL   Lab Results  Component Value Date   GLUCAP 328 (H) 11/18/2021   HGBA1C 7.4 (H) 11/11/2021    Review of Glycemic Control  Latest Reference Range & Units 11/17/21 11:41 11/17/21 16:39 11/17/21 22:04 11/18/21 06:51 11/18/21 08:15  Glucose-Capillary 70 - 99 mg/dL 446 (H) 258 (H) 155 (H) 232 (H) 328 (H)  (H): Data is abnormally high Diabetes history: type 2 Outpatient Diabetes medications: Metformin 1000 mg BID Current orders for Inpatient glycemic control: Levemir 20 units BID, Novolog RESISTANT correction scale TID & HS scale   Inpatient Diabetes Program Recommendations:    In preparation for discharge:  - Lantus 50 units QD -Humalog 8 units TID (assuming patient is consuming >50% of meals) - insulin pen needles [phone]   Spoke with patient regarding outpatient diabetes management. Patient was recently seen by PCP and was told to discontinue Metformin and Victoza as she was complaining of GI side effects. Patient has been on Metformin for longer than 2 years.  Reviewed patient's current A1c of 7.4%. Explained what a A1c is and what it measures. Also reviewed goal A1c with patient, importance of good glucose control @ home, and blood sugar goals. Reviewed patho of DM, DKA, role of pancreas, signs and symptoms of hyper vs hypo glycemia, interventions, vascular changes, stopping medications, impact of steroids, current glucose trends and insulin dosages, and other commorbidities.  Patient reports checking CBGs once per day. Has all needed supplies. Reviewed at length when to reach out to MD. Patient has an appointment next Tuesday.  Admits to drinking sugary beverages. Reviewed alternatives and plate method.  Educated  patient and spouse on insulin pen use at home. Reviewed contents of insulin flexpen starter kit. Reviewed all steps if insulin pen including attachment of needle, 2-unit air shot, dialing up dose, giving injection, removing needle, disposal of sharps, storage of unused insulin, disposal of insulin etc. Patient able to provide successful return demonstration. Also reviewed troubleshooting with insulin pen. MD to give patient Rxs for insulin pens and insulin pen needles.  Thanks, Bronson Curb, MSN, RNC-OB Diabetes Coordinator [phone] (8a-5p)

## 2021-11-18 NOTE — Telephone Encounter (Signed)
Pharmacy Patient Advocate Encounter  Insurance verification completed.    The patient is insured through [employer] Part D   The patient is currently admitted and ran test claims for the following: Levemir Pens, Lantus Pens, Humalog Pens, Novolog Pens.  Copays and coinsurance results were relayed to Inpatient clinical team.

## 2021-11-18 NOTE — TOC Benefit Eligibility Note (Signed)
Patient Teacher, English as a foreign language completed.    The patient is currently admitted and upon discharge could be taking Levemir Pens.  The current 30 day co-pay is $35.00.   The patient is currently admitted and upon discharge could be taking Lantus Pens.  The current 30 day co-pay is $35.00.   The patient is currently admitted and upon discharge could be taking Humalog Pens.  The current 30 day co-pay is $35.00.   The patient is currently admitted and upon discharge could be taking Novolog Pens.  Not Covered  The patient is insured through [place] Patient Advocate Specialist [place] Patient Advocate Team Direct Number: [phone]  Fax: [phone]

## 2021-11-18 NOTE — Progress Notes (Signed)
Occupational Therapy Treatment Patient Details Name: Victoria Jimenez MRN: [id number] DOB: [birth year] Today's Date: 11/18/2021   History of present illness 71 year old female presented with altered mental status and was initially hypertensive requiring intubation and sedation and admission to ICU.  She was found to be in DKA, treated with IV fluids/insulin drip and subsequently transitioned to long-acting insulin.  Neurology was consulted.  MRI was negative for acute stroke; EEG negative for seizures; LP was inconclusive.  She was extubated on 11/13/2021 and transferred to floor unit 10/7. PMH:  HSV, hyperlipidemia, CAD, diabetes mellitus type 2.   OT comments  Patient supine in bed, reporting need to use restroom.  Completing bed mobility with min assist, functional mobility using RW with supervision and toilet transfers with supervision.  Urgency and requires assist for LB bathing, dressing with supervision; then grooming at sink with supervision.  Min cueing for safety and RW mgmt.  EOB completing pill box test to test functional cognition- failing assessment with deficits noted in problem solving, awareness, attention, multitasking, planning, search strategies and recall. Spouse present and aware she will need assistance for med mgmt, driving and cooking at dc.  Will follow acutely.     Recommendations for follow up therapy are one component of a multi-disciplinary discharge planning process, led by the attending physician.  Recommendations may be updated based on patient status, additional functional criteria and insurance authorization.    Follow Up Recommendations  Home health OT    Assistance Recommended at Discharge Frequent or constant Supervision/Assistance  Patient can return home with the following  A little help with walking and/or transfers;A little help with bathing/dressing/bathroom;Assistance with cooking/housework;Direct supervision/assist for medications management;Direct  supervision/assist for financial management;Assist for transportation;Help with stairs or ramp for entrance   Equipment Recommendations  BSC/3in1    Recommendations for Other Services      Precautions / Restrictions Precautions Precautions: Fall Restrictions Weight Bearing Restrictions: No       Mobility Bed Mobility Overal bed mobility: Needs Assistance Bed Mobility: Supine to Sit     Supine to sit: Min guard     General bed mobility comments: reaching for UE support to fully ascend trunk, increased time required    Transfers Overall transfer level: Needs assistance Equipment used: Rolling walker (2 wheels) Transfers: Sit to/from Stand Sit to Stand: Supervision           General transfer comment: for safety     Balance Overall balance assessment: Needs assistance Sitting-balance support: Feet supported, No upper extremity supported Sitting balance-Leahy Scale: Good     Standing balance support: Bilateral upper extremity supported, No upper extremity supported, During functional activity Standing balance-Leahy Scale: Fair Standing balance comment: RW dynamically, 0-1 hand support during ADls SPV                           ADL either performed or assessed with clinical judgement   ADL Overall ADL's : Needs assistance/impaired     Grooming: Wash/dry hands;Wash/dry face;Supervision/safety;Standing               Lower Body Dressing: Supervision/safety;Sit to/from stand   Toilet Transfer: Supervision/safety;Cueing for safety;Ambulation;Regular Toilet;Rolling walker (2 wheels)   Toileting- Clothing Manipulation and Hygiene: Supervision/safety;Sitting/lateral lean;Sit to/from stand       Functional mobility during ADLs: Supervision/safety;Cueing for safety;Rolling walker (2 wheels) General ADL Comments: pt with urgency of urination, did not make it to commode.  Total assist to clean up but managing clothing  and bathing needs with  superviison.    Extremity/Trunk Assessment              Vision       Perception     Praxis      Cognition Arousal/Alertness: Awake/alert Behavior During Therapy: WFL for tasks assessed/performed, Restless Overall Cognitive Status: Impaired/Different from baseline Area of Impairment: Attention, Memory, Following commands, Awareness, Safety/judgement, Problem solving                   Current Attention Level: Sustained Memory: Decreased short-term memory Following Commands: Follows one step commands consistently, Follows one step commands with increased time, Follows multi-step commands inconsistently Safety/Judgement: Decreased awareness of safety, Decreased awareness of deficits Awareness: Emergent Problem Solving: Slow processing, Difficulty sequencing, Requires verbal cues General Comments: pt completing pill box test, see below for details   General Comments: Assessed using the Pill Box Test. Pt failed the assessment, demonstrating poor planning, mental flexibility, suboptimal search strategies, concrete thinking and the inability to multitask. Pt had a total of 29 errors, where more than 3 errors is considered a fail.  Errors: One tablet 3x/day (yellow) - 14 errors (omission/misplacement) One tablet 2x/day with breakfast and dinner (green) - 7 errors (omission/misplacement) One tablet in the morning (Blue)- 7 errors (omission/misplacement) One tablet daily at bedtime (orange) - 0 errors(omission/misplacement) One tablet every other day (red) - 0 errors (omission/misplacement)   Number of misplaced movement errors (pills placed in incorrect compartment)- 21 Number of total errors (sum of omissions; misplacements) - 7 Total time to complete task (allowed 5 min) - 10 min    Pt initially placing all medications in top container, therapist redirected and pointed out the different times of day.  Pt correctly placed the 'bedtime' medication but then reverted back to  placing multiple pills/day in the same containers. She completed missed the 1x at breakfast pill.   Exercises      Shoulder Instructions       General Comments spouse present and supportive    Pertinent Vitals/ Pain       Pain Assessment Pain Assessment: No/denies pain  Home Living                                          Prior Functioning/Environment              Frequency  Min 2X/week        Progress Toward Goals  OT Goals(current goals can now be found in the care plan section)  Progress towards OT goals: Progressing toward goals  Acute Rehab OT Goals Patient Stated Goal: home OT Goal Formulation: With patient Time For Goal Achievement: 12/01/21 Potential to Achieve Goals: Good  Plan Discharge plan remains appropriate;Frequency remains appropriate    Co-evaluation                 AM-PAC OT "6 Clicks" Daily Activity     Outcome Measure   Help from another person eating meals?: None Help from another person taking care of personal grooming?: A Little Help from another person toileting, which includes using toliet, bedpan, or urinal?: A Little Help from another person bathing (including washing, rinsing, drying)?: A Little Help from another person to put on and taking off regular upper body clothing?: A Little Help from another person to put on and taking off regular lower body clothing?: A Little 6 Click  Score: 19    End of Session Equipment Utilized During Treatment: Rolling walker (2 wheels)  OT Visit Diagnosis: Unsteadiness on feet (R26.81);Muscle weakness (generalized) (M62.81);Other symptoms and signs involving cognitive function   Activity Tolerance Patient tolerated treatment well   Patient Left in bed;with call bell/phone within reach;with bed alarm set;with family/visitor present   Nurse Communication Mobility status        Time: 3300-7622 OT Time Calculation (min): 33 min  Charges: OT General Charges $OT  Visit: 1 Visit OT Treatments $Self Care/Home Management : 23-37 mins  [place] Office [phone]   Delight Stare 11/18/2021, 12:15 PM

## 2021-11-18 NOTE — Progress Notes (Signed)
Patient Is being discharged home. All discharge instructions reviewed with patient including new medications. Patient and husband verbalized full understanding of all. Instructed patient on where to pick up her new prescriptions. Patient husband is her ride home.

## 2021-11-18 NOTE — Discharge Summary (Signed)
Physician Discharge Summary  Victoria Jimenez WPV:[id number] DOB: 04/10/1950 DOA: 11/11/2021  PCP: Buzzy Han, MD (Inactive)  Admit date: 11/11/2021 Discharge date: 11/18/2021  Admitted From: Home Disposition: Home  Recommendations for Outpatient Follow-up:  Follow up with PCP in 1 week with repeat CBC/BMP Outpatient follow-up with neurology Follow up in ED if symptoms worsen or new appear   Home Health: Home health PT Equipment/Devices: None  Discharge Condition: Stable CODE STATUS: Full Diet recommendation: Heart healthy/carb modified  Brief/Interim Summary: 71 year old female with past medical history of HSV, hyperlipidemia, CAD, diabetes mellitus type 2 presented with altered mental status and was initially hypertensive requiring intubation and sedation and admission to ICU.  She was found to be in DKA, treated with IV fluids/insulin drip and subsequently transitioned to long-acting insulin.  Neurology was consulted.  MRI was negative for acute stroke; EEG negative for seizures; LP was inconclusive.  She was extubated on 11/13/2021.  She was transferred to [facility] service from 11/15/2021 onwards.  Subsequently, her overall condition has gradually improved.  PT initially recommended CIR placement but later recommended home health PT.  Her blood sugars are elevated but improving.  Mental status has much improved, still slow to respond but currently appropriate.  Will need neurology follow-up as an outpatient.  She will be discharged home today on long-acting and short acting insulin.  Discharge Diagnoses:   Acute metabolic encephalopathy -Work-up so far has been negative including MRI brain/EEG with LP being inconclusive.  Ammonia and TSH normal.  Neurology signed off on 11/15/2021.  Neurology not worried about HSV encephalitis since HSV PCR was negative; awaiting results of autoimmune encephalitis panel.  Outpatient follow-up with neurology. -Mental status has more improved.   Still slightly slow to respond.   Treated with high dose thiamine: We will discharge home on thiamine 100 mg daily along with folic acid 1 mg daily -B12 level 640.   -Discharge patient home today.  Acute respiratory failure with hypoxia -Possibly due to mental status abnormality.  Intubated on presentation and subsequently extubated on 11/13/2021.  Transferred to [facility] service from 11/15/2020 onwards.  Currently on room air.   DKA Diabetes mellitus type 2 with hyperglycemia -A1c 7.4.  Has already been transitioned to long-acting insulin.  Blood sugars still elevated but improving.  Currently on long-acting insulin and CBGs with SSI.  Diabetes coordinator following.  Continue carb modified diet.  -Discharge patient home on long-acting insulin and short acting insulin.  Hold oral regimen till reevaluation by PCP. Outpatient follow-up with PCP.  Hypertension -- Blood pressure much improved.  Continue amlodipine, hydrochlorothiazide and losartan.     Acute kidney injury -Resolved.  Creatinine stable   Leukocytosis -Resolved.  No labs today.   Hypokalemia -Improved.  No labs today.   Anemia of chronic disease -Questionable cause.  Hemoglobin stable.  Outpatient follow-up.  CAD -Stable.  No chest pain.  Continue aspirin and statin   Diabetic neuropathy -gabapentin on hold.  Mental status still does not allow to resume gabapentin.  Outpatient follow-up with PCP.   Obesity -Outpatient follow-up   Physical deconditioning -PT initially recommended CIR placement but later recommended home health PT.     Discharge Instructions   Allergies as of 11/18/2021   No Known Allergies      Medication List     STOP taking these medications    gabapentin 300 MG capsule Commonly known as: NEURONTIN   metFORMIN 1000 MG tablet Commonly known as: GLUCOPHAGE   Victoza 18 MG/3ML Sopn Generic drug:  liraglutide       TAKE these medications    acetaminophen 325 MG tablet Commonly  known as: TYLENOL Take 650 mg by mouth every 6 (six) hours as needed for mild pain, moderate pain, fever or headache.   amLODipine 10 MG tablet Commonly known as: NORVASC Take 1 tablet (10 mg total) by mouth daily.   aspirin EC 81 MG tablet Take 81 mg by mouth daily.   atorvastatin 40 MG tablet Commonly known as: LIPITOR Take 40 mg by mouth daily.   AZO CRANBERRY PO Take 2 capsules by mouth daily.   blood glucose meter kit and supplies Kit Dispense based on patient and insurance preference. Use up to four times daily as directed.   Caltrate 600+D Plus Minerals 600-800 MG-UNIT Tabs Take 1 tablet by mouth in the morning and at bedtime.   Claritin 10 MG tablet Generic drug: loratadine Take 10 mg by mouth daily.   folic acid 1 MG tablet Commonly known as: FOLVITE Take 1 tablet (1 mg total) by mouth daily.   hydrochlorothiazide 25 MG tablet Commonly known as: HYDRODIURIL Take 25 mg by mouth daily.   insulin glargine 100 UNIT/ML Solostar Pen Commonly known as: LANTUS Inject 50 Units into the skin daily.   insulin lispro 100 UNIT/ML KwikPen Commonly known as: HUMALOG Inject 8 Units into the skin 3 (three) times daily.   Insulin Pen Needle 32G X 4 MM Misc Lantus 50 units daily and Humalog 8 units TID   losartan 100 MG tablet Commonly known as: COZAAR Take 100 mg by mouth daily.   pantoprazole 40 MG tablet Commonly known as: PROTONIX Take 40 mg by mouth daily.   POTASSIUM PO Take 99 mg by mouth daily as needed (for cramping (legs & feet)).   thiamine 100 MG tablet Commonly known as: VITAMIN B1 Take 1 tablet (100 mg total) by mouth daily.   Vitamin D3 1000 units Caps Take 1 capsule by mouth daily.               Durable Medical Equipment  (From admission, onward)           Start     Ordered   11/17/21 1504  For home use only DME Walker rolling  Once       Question Answer Comment  Walker: With 5 Inch Wheels   Patient needs a walker to treat with  the following condition Weakness      11/17/21 1503              Follow-up Information     [facility] [place] Follow up.   Specialty: Home Health Services Why: The home health agency will contact you for the first home visit. Contact information: [address] [place] [address] [phone]                No Known Allergies  Consultations: PCCM/neurology   Procedures/Studies: DG Abd Portable 1V  Result Date: 11/12/2021 CLINICAL DATA:  OG tube placement. EXAM: PORTABLE ABDOMEN - 1 VIEW COMPARISON:  Plain film of the abdomen dated 11/11/2021. FINDINGS: OG tube appears well positioned within the stomach with tip directed towards the stomach antrum/duodenal bulb. Visualized bowel gas pattern is nonobstructive. No evidence of free intraperitoneal air. Lung bases appear clear. IMPRESSION: OG tube appears well positioned within the stomach with tip directed towards the stomach antrum/duodenal bulb. Electronically Signed   By: Franki Cabot M.D.   On: 11/12/2021 08:25   DG Abd 1  View  Result Date: 11/11/2021 CLINICAL DATA:  Check gastric catheter placement EXAM: ABDOMEN - 1 VIEW COMPARISON:  None Available. FINDINGS: Gastric catheter is noted in the distal stomach. No free air is seen. No obstructive changes are noted. IMPRESSION: Gastric catheter within the stomach. Electronically Signed   By: Inez Catalina M.D.   On: 11/11/2021 21:47   [place] EKG SITE RITE  Result Date: 11/11/2021 If Site Rite image not attached, placement could not be confirmed due to current cardiac rhythm.  Overnight EEG with video  Result Date: 11/11/2021 Lora Havens, MD     11/12/2021 11:06 AM Patient Name: Victoria Jimenez MRN: [id number] Epilepsy Attending: Lora Havens Referring Physician/Provider: Greta Doom, MD Duration: 11/11/2021 4098 to 11/12/2021 1191 Patient history:  71 y.o. female with a history of hypertension, hyperlipidemia who is normal when she went to  bed last night around 10 PM. EEG to evaluate for seizure Level of alertness: comatose AEDs during EEG study: Propofol Technical aspects: This EEG study was done with scalp electrodes positioned according to the 10-20 International system of electrode placement. Electrical activity was reviewed with band pass filter of 1-_0 , sensitivity of 7 uV/mm, display speed of 39m/sec with a _1  notched filter applied as appropriate. EEG data were recorded continuously and digitally stored.  Video monitoring was available and reviewed as appropriate. Description: EEG showed intermittent generalized 3 to 6 Hz theta-delta slowing lasting 1 to 2 seconds alternating with 3 to 5 seconds of generalized EEG attenuation. Hyperventilation and photic stimulation were not performed.   Patient was noted to have left upper and left lower extremity tremor-like movements during patient care on 11/12/2021 at 0111.  Concomitant EEG before, during and after the event did not show any EEG change to suggest seizure.  This was most likely not an epileptic event. Event button was pressed on 11/12/2021 at [place]  Patient was noted to have left upper extremity and right lower extremity tremor-like movements.  Concomitant EEG before, during and after the study did not show any EEG changes suggest seizure.  This was most not an epileptic event. EKG artifact was seen throughout the study.  Also, parts of study were difficult to interpret due to significant myogenic artifact. ABNORMALITY -Intermittent slow, generalized -Background attenuation, generalized IMPRESSION: This study is suggestive of severe to profound diffuse encephalopathy. No seizures or epileptiform discharges were seen throughout the recording. Two events were recorded as described above without concomitant EEG change.  These were not epileptic events. PLora Havens  DG Chest Portable 1 View  Result Date: 11/11/2021 CLINICAL DATA:  71year old female status post endotracheal tube  and orogastric tube placement. Code stroke. EXAM: PORTABLE CHEST 1 VIEW COMPARISON:  Chest x-ray 09/01/2015. FINDINGS: An endotracheal tube is in place with tip 3.4 cm above the carina. A nasogastric tube is seen extending into the stomach, however, the tip of the nasogastric tube extends below the lower margin of the image. Lung volumes are low. No consolidative airspace disease. No pleural effusions. No pneumothorax. No pulmonary nodule or mass noted. Pulmonary vasculature and the cardiomediastinal silhouette are within normal limits. IMPRESSION: 1. Support apparatus, as above. 2. Low lung volumes without radiographic evidence of acute cardiopulmonary disease. Electronically Signed   By: DVinnie LangtonM.D.   On: 11/11/2021 07:47   EEG adult  Result Date: 11/11/2021 KGreta Doom MD     11/11/2021  7:01 AM History: 71yo F with acute right sided weakness and aphasai Sedation: none  Technique: This EEG was acquired with electrodes placed according to the International 10-20 electrode system (including Fp1, Fp2, F3, F4, C3, C4, P3, P4, O1, O2, T3, T4, T5, T6, A1, A2, Fz, Cz, Pz). The following electrodes were missing or displaced: none. Background: The background is obscured with muscle artifact through much of the recording, but appears to consist predominantly of low voltage alpha and beta activities.  I do not see any definite posterior dominant rhythm. Photic stimulation: Physiologic driving is not performed EEG Abnormalities: 1) generalized slow activity 2) attenuated or absent PDR Clinical Interpretation: This EEG is consistent with a generalized nonspecific cerebral dysfunction (encephalopathy). There was no seizure or seizure predisposition recorded on this study. Please note that lack of epileptiform activity on EEG does not preclude the possibility of epilepsy. Roland Rack, MD [facility] [phone] If 7pm- 7am, please page neurology on call as listed in [place].   MR  BRAIN WO CONTRAST  Result Date: 11/11/2021 CLINICAL DATA:  Stroke follow-up EXAM: MRI HEAD WITHOUT CONTRAST TECHNIQUE: Multiplanar, multiecho pulse sequences of the brain and surrounding structures were obtained without intravenous contrast. COMPARISON:  Head CT and CTA from earlier today and brain MRI 09/30/2021 FINDINGS: Truncated brain MRI, diffusion, FLAIR, and gradient which required. No acute infarct by diffusion. 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 Very motion degraded gradient imaging which is negative for acute hemorrhage. No hydrocephalus or evidence of collection. IMPRESSION: Motion degraded brain MRI without acute finding or change from 09/30/2021. Electronically Signed   By: Jorje Guild M.D.   On: 11/11/2021 06:11   CT ANGIO HEAD NECK W WO CM W PERF (CODE STROKE)  Result Date: 11/11/2021 CLINICAL DATA:  Code stroke.  Concern for LV 0 EXAM: CT ANGIOGRAPHY HEAD AND NECK CT PERFUSION BRAIN TECHNIQUE: Multidetector CT imaging of the head and neck was performed using the standard protocol during bolus administration of intravenous contrast. Multiplanar CT image reconstructions and MIPs were obtained to evaluate the vascular anatomy. Carotid stenosis measurements (when applicable) are obtained utilizing NASCET criteria, using the distal internal carotid diameter as the denominator. Multiphase CT imaging of the brain was performed following IV bolus contrast injection. Subsequent parametric perfusion maps were calculated using RAPID software. RADIATION DOSE REDUCTION: This exam was performed according to the departmental dose-optimization program which includes automated exposure control, adjustment of the mA and/or kV according to patient size and/or use of iterative reconstruction technique. CONTRAST:  148m OMNIPAQUE IOHEXOL 350  MG/ML SOLN COMPARISON:  Brain MRI 09/30/2021 FINDINGS: CTA NECK FINDINGS Aortic arch: 2 vessel branching.  No significant finding Right carotid system: Mild calcified plaque at the bifurcation. No stenosis or ulceration Left carotid system: Mild to moderate calcified plaque at the bifurcation. No stenosis or ulceration. Vertebral arteries: Codominant vertebral arteries are smoothly contoured and widely patent to the dura. Skeleton: Ordinary cervical spine degeneration. Other neck: No acute finding Upper chest: Clear apical lungs Review of the MIP images confirms the above findings CTA HEAD FINDINGS Anterior circulation: No significant stenosis, proximal occlusion, aneurysm, or vascular malformation. Posterior circulation: No proximal occlusion, aneurysm, or vascular malformation. Mild to moderate right P2 segment narrowing. Venous sinuses: Unremarkable for contrast timing Anatomic variants: None significant Review of the MIP images confirms the above findings CT Brain Perfusion Findings: CT perfusion was attempted but is nondiagnostic due to processing failure. Review of source  images shows the patient turned approximately 45 degrees during the scan acquisition, likely the confounding factor. IMPRESSION: 1. No emergent finding including large vessel occlusion. 2. Atherosclerosis without flow limiting stenosis or ulceration of major vessels in the head and neck. Electronically Signed   By: Jorje Guild M.D.   On: 11/11/2021 05:53   CT HEAD CODE STROKE WO CONTRAST  Result Date: 11/11/2021 CLINICAL DATA:  Code stroke. EXAM: CT HEAD WITHOUT CONTRAST TECHNIQUE: Contiguous axial images were obtained from the base of the skull through the vertex without intravenous contrast. RADIATION DOSE REDUCTION: This exam was performed according to the departmental dose-optimization program which includes automated exposure control, adjustment of the mA and/or kV according to patient size and/or use of iterative reconstruction  technique. COMPARISON:  Brain MRI 09/30/2021 FINDINGS: Brain: No evidence of acute infarction, hemorrhage, hydrocephalus, extra-axial collection or mass lesion/mass effect. 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 Vascular: No hyperdense vessel or unexpected calcification. Skull: Normal. Negative for fracture or focal lesion. Sinuses/Orbits: No acute finding. Other: These results were communicated to Dr. Leonel Ramsay at 5:30 am on 11/11/2021 by text page via the [facility] messaging system. ASPECTS [facility] Stroke Program Early CT Score) Not scored without localizing symptom. IMPRESSION: No acute finding. Unchanged remote infarcts when compared to 09/30/2021 brain MRI. Electronically Signed   By: Jorje Guild M.D.   On: 11/11/2021 05:31      Subjective: Patient seen and examined at bedside.  Awake, slow to respond but answers some questions appropriately.  Feels okay to go home today.  No fever, vomiting, seizures reported.  Discharge Exam: Vitals:   11/18/21 0805 11/18/21 1100  BP: (!) 154/58 (!) 167/83  Pulse: 74 76  Resp: 18 18  Temp: 98.6 F (37 C) 99.6 F (37.6 C)  SpO2: 100% 100%    General: Pt is alert, awake, not in acute distress.  Currently on room air.  Slow to respond, answers some questions appropriately. Cardiovascular: rate controlled, S1/S2 + Respiratory: bilateral decreased breath sounds at bases Skin Abdominal: Soft, obese, NT, ND, bowel sounds + Extremities: Trace lower extremity edema; no cyanosis    The results of significant diagnostics from this hospitalization (including imaging, microbiology, ancillary and laboratory) are listed below for reference.     Microbiology: Recent Results (from the past 240 hour(s))  Culture, blood (single)     Status: None   Collection Time: 11/11/21  6:59 AM   Specimen: BLOOD RIGHT HAND  Result Value Ref Range Status   Specimen Description BLOOD RIGHT HAND  Final   Special Requests   Final     BOTTLES DRAWN AEROBIC AND ANAEROBIC Blood Culture adequate volume   Culture   Final    NO GROWTH 5 DAYS Performed at [facility], [address] [place], [place] [address]    Report Status 11/16/2021 FINAL  Final  Urine Culture     Status: None   Collection Time: 11/11/21  8:00 AM   Specimen: Urine, Clean Catch  Result Value Ref Range Status   Specimen Description URINE, CLEAN CATCH  Final   Special Requests NONE  Final   Culture   Final    NO GROWTH Performed at [facility], [place] [address] [place], [place] [address]    Report Status 11/12/2021 FINAL  Final  Culture, blood (Routine X 2) w Reflex to ID Panel     Status: None   Collection Time: 11/11/21 10:33 AM   Specimen: BLOOD  RIGHT HAND  Result Value Ref Range Status   Specimen Description BLOOD RIGHT HAND  Final   Special Requests   Final    BOTTLES DRAWN AEROBIC ONLY Blood Culture adequate volume   Culture   Final    NO GROWTH 5 DAYS Performed at [facility], [address] [place], [place] [address]    Report Status 11/16/2021 FINAL  Final  CSF culture w Gram Stain     Status: None   Collection Time: 11/11/21 10:52 AM   Specimen: CSF; Cerebrospinal Fluid  Result Value Ref Range Status   Specimen Description CSF  Final   Special Requests NONE  Final   Gram Stain   Final    WBC PRESENT, PREDOMINANTLY PMN NO ORGANISMS SEEN CYTOSPIN SMEAR    Culture   Final    NO GROWTH 3 DAYS Performed at [facility], [place] [address] [place], [place] [address]    Report Status 11/14/2021 FINAL  Final  VZV PCR, CSF     Status: None   Collection Time: 11/11/21 10:52 AM   Specimen: Lumbar Puncture; Cerebrospinal Fluid  Result Value Ref Range Status   VZV PCR, CSF Negative Negative Final    Comment: (NOTE) No Varicella Zoster Virus DNA detected. Performed At: [facility] [place], [place] [id number] Rush Farmer MD [id number]   Culture, Respiratory w Gram Stain      Status: None   Collection Time: 11/11/21 10:54 AM   Specimen: Tracheal Aspirate; Respiratory  Result Value Ref Range Status   Specimen Description TRACHEAL ASPIRATE  Final   Special Requests NONE  Final   Gram Stain   Final    RARE WBC PRESENT,BOTH PMN AND MONONUCLEAR FEW GRAM POSITIVE COCCI IN CLUSTERS    Culture   Final    RARE Normal respiratory flora-no Staph aureus or Pseudomonas seen Performed at [facility], [address] [place], [place] [address]    Report Status 11/13/2021 FINAL  Final  Fungus Culture With Stain     Status: Abnormal (Preliminary result)   Collection Time: 11/11/21 10:57 AM   Specimen: Lumbar Puncture; Cerebrospinal Fluid  Result Value Ref Range Status   Fungus Stain Final report (A)  Final    Comment: (NOTE) Performed At: [facility] [place], [place] [id number] Rush Farmer MD [id number]    Fungus (Mycology) Culture PENDING  Incomplete   Fungal Source TRACHEAL ASPIRATE  Final    Comment: Performed at [facility], [place] [address], [place], [place] [address]  Fungus Culture Result     Status: Abnormal   Collection Time: 11/11/21 10:57 AM  Result Value Ref Range Status   Result 1 Yeast observed (A)  Final    Comment: (NOTE) Performed At: [facility] [address] [place], [place] [id number] Rush Farmer MD [id number]   Culture, blood (Routine X 2) w Reflex to ID Panel     Status: None   Collection Time: 11/11/21 12:51 PM   Specimen: BLOOD LEFT HAND  Result Value Ref Range Status   Specimen Description BLOOD LEFT HAND  Final   Special Requests IN PEDIATRIC BOTTLE Blood Culture adequate volume  Final   Culture   Final    NO GROWTH 5 DAYS Performed at [facility], [place] [address], [place],  [address]    Report Status 11/16/2021 FINAL  Final  MRSA Next Gen by PCR, Nasal     Status: None  Collection Time: 11/11/21  6:52 PM   Specimen: Nasal Mucosa; Nasal Swab  Result Value  Ref Range Status   MRSA by PCR Next Gen NOT DETECTED NOT DETECTED Final    Comment: (NOTE) The GeneXpert MRSA Assay (FDA approved for NASAL specimens only), is one component of a comprehensive MRSA colonization surveillance program. It is not intended to diagnose MRSA infection nor to guide or monitor treatment for MRSA infections. Test performance is not FDA approved in patients less than 80 years old. Performed at [facility], [place] [address], [place],  [address]      Labs: BNP (last 3 results) No results for input(s): "BNP" in the last 8760 hours. Basic Metabolic Panel: Recent Labs  Lab 11/11/21 1529 11/11/21 1851 11/12/21 0450 11/12/21 0819 11/12/21 1234 11/13/21 0407 11/13/21 2044 11/14/21 0709 11/15/21 1015 11/16/21 0535  NA 137   < >  --  136   < > 132* 141 138 136 137  K 3.3*   < >  --  4.1   < > 3.1* 4.1 4.0 3.2* 3.7  CL 100   < >  --  102   < > 102 107 103 99 98  CO2 20*   < >  --  22   < > _0 GLUCOSE 386*   < >  --  173*   < > 225* 234* 261* 282* 217*  BUN 21   < >  --  28*   < > 35* _1 CREATININE 1.68*   < >  --  1.34*   < > 1.34* 1.07* 0.99 0.93 1.00  CALCIUM 9.1   < >  --  8.4*   < > 7.5* 8.8* 8.8* 9.1 9.5  MG 1.3*  --  2.2 2.4  --   --   --  2.0  --  2.0  PHOS 1.3*  --  3.9 4.6  --   --   --   --   --   --    < > = values in this interval not displayed.   Liver Function Tests: Recent Labs  Lab 11/14/21 0709 11/16/21 0535  AST 28 29  ALT 22 29  ALKPHOS 60 56  BILITOT 0.9 0.7  PROT 6.7 6.2*  ALBUMIN 3.2* 2.9*   No results for input(s): "LIPASE", "AMYLASE" in the last 168 hours. No results for input(s): "AMMONIA" in the last 168 hours. CBC: Recent Labs  Lab 11/12/21 0450 11/13/21 0407 11/14/21 0709 11/15/21 1015 11/16/21 0535  WBC 15.9* 12.8* 10.0 7.7 7.2  NEUTROABS  --  9.8* 7.5 5.1 4.3  HGB 10.1* 9.5* 10.4* 10.8* 10.7*  HCT 29.6* 27.1* 30.8* 31.4* 32.0*  MCV 85.1 86.6 84.6 82.8 85.3  PLT 216 195 231  240 242   Cardiac Enzymes: Recent Labs  Lab 11/11/21 1429  CKTOTAL 86   BNP: Invalid input(s): "POCBNP" CBG: Recent Labs  Lab 11/17/21 1639 11/17/21 2204 11/18/21 0651 11/18/21 0815 11/18/21 1118  GLUCAP 258* 155* 232* 328* 297*   D-Dimer No results for input(s): "DDIMER" in the last 72 hours. Hgb A1c No results for input(s): "HGBA1C" in the last 72 hours. Lipid Profile No results for input(s): "CHOL", "HDL", "LDLCALC", "TRIG", "CHOLHDL", "LDLDIRECT" in the last 72 hours. Thyroid function studies No results for input(s): "TSH", "T4TOTAL", "T3FREE", "THYROIDAB" in the last 72 hours.  Invalid input(s): "FREET3" Anemia work up [employer]    11/16/21 0535  VITAMINB12 640   Urinalysis    Component Value Date/Time   COLORURINE STRAW (A) 11/11/2021 0800   APPEARANCEUR CLEAR 11/11/2021 0800   LABSPEC 1.017 11/11/2021 0800   PHURINE 8.0 11/11/2021 0800   GLUCOSEU >=500 (A) 11/11/2021 0800   HGBUR SMALL (A) 11/11/2021 0800   BILIRUBINUR NEGATIVE 11/11/2021 0800   KETONESUR NEGATIVE 11/11/2021 0800   PROTEINUR 100 (A) 11/11/2021 0800   NITRITE NEGATIVE 11/11/2021 0800   LEUKOCYTESUR NEGATIVE 11/11/2021 0800   Sepsis Labs Recent Labs  Lab 11/13/21 0407 11/14/21 0709 11/15/21 1015 11/16/21 0535  WBC 12.8* 10.0 7.7 7.2   Microbiology Recent Results (from the past 240 hour(s))  Culture, blood (single)     Status: None   Collection Time: 11/11/21  6:59 AM   Specimen: BLOOD RIGHT HAND  Result Value Ref Range Status   Specimen Description BLOOD RIGHT HAND  Final   Special Requests   Final    BOTTLES DRAWN AEROBIC AND ANAEROBIC Blood Culture adequate volume   Culture   Final    NO GROWTH 5 DAYS Performed at [facility], [place] [address], [place], [place] [address]    Report Status 11/16/2021 FINAL  Final  Urine Culture     Status: None   Collection Time: 11/11/21  8:00 AM   Specimen: Urine, Clean Catch  Result Value Ref Range Status   Specimen  Description URINE, CLEAN CATCH  Final   Special Requests NONE  Final   Culture   Final    NO GROWTH Performed at [facility], [place] [address], [place], [place] [address]    Report Status 11/12/2021 FINAL  Final  Culture, blood (Routine X 2) w Reflex to ID Panel     Status: None   Collection Time: 11/11/21 10:33 AM   Specimen: BLOOD RIGHT HAND  Result Value Ref Range Status   Specimen Description BLOOD RIGHT HAND  Final   Special Requests   Final    BOTTLES DRAWN AEROBIC ONLY Blood Culture adequate volume   Culture   Final    NO GROWTH 5 DAYS Performed at [facility], [place] [address] [place], [place] [address]    Report Status 11/16/2021 FINAL  Final  CSF culture w Gram Stain     Status: None   Collection Time: 11/11/21 10:52 AM   Specimen: CSF; Cerebrospinal Fluid  Result Value Ref Range Status   Specimen Description CSF  Final   Special Requests NONE  Final   Gram Stain   Final    WBC PRESENT, PREDOMINANTLY PMN NO ORGANISMS SEEN CYTOSPIN SMEAR    Culture   Final    NO GROWTH 3 DAYS Performed at [facility], [place] [address] [place], [place] [address]    Report Status 11/14/2021 FINAL  Final  VZV PCR, CSF     Status: None   Collection Time: 11/11/21 10:52 AM   Specimen: Lumbar Puncture; Cerebrospinal Fluid  Result Value Ref Range Status   VZV PCR, CSF Negative Negative Final    Comment: (NOTE) No Varicella Zoster Virus DNA detected. Performed At: [facility] [place], [place] [id number] Rush Farmer MD [id number]   Culture, Respiratory w Gram Stain     Status: None   Collection Time: 11/11/21 10:54 AM   Specimen: Tracheal Aspirate; Respiratory  Result Value Ref Range Status   Specimen Description TRACHEAL ASPIRATE  Final   Special Requests NONE  Final   Gram Stain  Final    RARE WBC PRESENT,BOTH PMN AND MONONUCLEAR FEW GRAM POSITIVE COCCI IN CLUSTERS    Culture   Final    RARE Normal respiratory flora-no Staph  aureus or Pseudomonas seen Performed at [facility], [address], [place], [place] [address]    Report Status 11/13/2021 FINAL  Final  Fungus Culture With Stain     Status: Abnormal (Preliminary result)   Collection Time: 11/11/21 10:57 AM   Specimen: Lumbar Puncture; Cerebrospinal Fluid  Result Value Ref Range Status   Fungus Stain Final report (A)  Final    Comment: (NOTE) Performed At: [facility] [place], [place] [id number] Rush Farmer MD [id number]    Fungus (Mycology) Culture PENDING  Incomplete   Fungal Source TRACHEAL ASPIRATE  Final    Comment: Performed at [facility], [place] [address], [place], [place] [address]  Fungus Culture Result     Status: Abnormal   Collection Time: 11/11/21 10:57 AM  Result Value Ref Range Status   Result 1 Yeast observed (A)  Final    Comment: (NOTE) Performed At: [facility] [address] [place], [place] [id number] Rush Farmer MD [id number]   Culture, blood (Routine X 2) w Reflex to ID Panel     Status: None   Collection Time: 11/11/21 12:51 PM   Specimen: BLOOD LEFT HAND  Result Value Ref Range Status   Specimen Description BLOOD LEFT HAND  Final   Special Requests IN PEDIATRIC BOTTLE Blood Culture adequate volume  Final   Culture   Final    NO GROWTH 5 DAYS Performed at [facility], [place] [address], [place], [place] [address]    Report Status 11/16/2021 FINAL  Final  MRSA Next Gen by PCR, Nasal     Status: None   Collection Time: 11/11/21  6:52 PM   Specimen: Nasal Mucosa; Nasal Swab  Result Value Ref Range Status   MRSA by PCR Next Gen NOT DETECTED NOT DETECTED Final    Comment: (NOTE) The GeneXpert MRSA Assay (FDA approved for NASAL specimens only), is one component of a comprehensive MRSA colonization surveillance program. It is not intended to diagnose MRSA infection nor to guide or monitor treatment for MRSA infections. Test performance is not FDA approved  in patients less than 25 years old. Performed at [facility], [place] [address] [place], [place] [address]      Time coordinating discharge: 35 minutes  SIGNED:   Aline August, MD  [facility] 11/18/2021, 11:56 AM

## 2021-12-08 LAB — MISC LABCORP TEST (SEND OUT): Labcorp test code: 9985

## 2021-12-09 LAB — FUNGUS CULTURE WITH STAIN

## 2021-12-09 LAB — FUNGUS CULTURE RESULT

## 2021-12-09 LAB — FUNGAL ORGANISM REFLEX

## 2022-01-06 ENCOUNTER — Ambulatory Visit: Payer: Medicare Other | Admitting: Neurology

## 2022-01-06 ENCOUNTER — Encounter: Payer: Self-pay | Admitting: Neurology

## 2022-01-06 VITALS — BP 157/74 | HR 67 | Ht 66.0 in | Wt 215.0 lb

## 2022-01-06 DIAGNOSIS — R410 Disorientation, unspecified: Secondary | ICD-10-CM

## 2022-01-06 NOTE — Patient Instructions (Signed)
Continue current medications  Continue with therapy  Follow up in a year

## 2022-01-06 NOTE — Progress Notes (Signed)
[facility]  PATIENT: Victoria Jimenez DOB: [birth year]  REQUESTING CLINICIAN: Aline August, MD HISTORY FROM: Patient and chart review  REASON FOR VISIT: Altered mental status    HISTORICAL  CHIEF COMPLAINT:  Chief Complaint  Patient presents with   New Patient (Initial Visit)    Rm 12, alone  NP internal referral for altered mental status C/o memory issues     HISTORY OF PRESENT ILLNESS:  This is a 71 year old woman past medical history of hypertension, hyperlipidemia, diabetes mellitus, obstructive sleep sleep apnea on CPAP who is presenting after being admitted to the hospital for altered mental status found to be in DKA.  Patient reports she does not remember why she was admitted in the hospital, she does not remember her hospitalization, she has a vague memory but is unclear.  She was told by family that she was confused, she thought that her hospital bed was her car and she also thought that she was in her house while she was in the hospital.  History limited but chart reviewed, Patient reports since discharge from the hospital, she continued to improve.  She is getting physical and occasional therapy weekly, her main complaint is dizziness in the morning but after a few hours to subside.  She also reports urinary incontinence, denies any recent UTI.   Hospital Summary and Course 71 year old female with past medical history of HSV, hyperlipidemia, CAD, diabetes mellitus type 2 presented with altered mental status and was initially hypertensive requiring intubation and sedation and admission to ICU.  She was found to be in DKA, treated with IV fluids/insulin drip and subsequently transitioned to long-acting insulin.  Neurology was consulted.  MRI was negative for acute stroke; EEG negative for seizures; LP was inconclusive.  She was extubated on 11/13/2021.  She was transferred to [facility] service from 11/15/2021 onwards.  Subsequently, her overall condition has  gradually improved.  PT initially recommended CIR placement but later recommended home health PT.  Her blood sugars are elevated but improving.  Mental status has much improved, still slow to respond but currently appropriate.  Will need neurology follow-up as an outpatient.  She will be discharged home today on long-acting and short acting insulin.   Acute metabolic encephalopathy -Work-up so far has been negative including MRI brain/EEG with LP being inconclusive.  Ammonia and TSH normal.  Neurology signed off on 11/15/2021.  Neurology not worried about HSV encephalitis since HSV PCR was negative; awaiting results of autoimmune encephalitis panel.  Outpatient follow-up with neurology. -Mental status has more improved.  Still slightly slow to respond.   Treated with high dose thiamine: We will discharge home on thiamine 100 mg daily along with folic acid 1 mg daily -B12 level 640.    OTHER MEDICAL CONDITIONS: Hypertension, hyperlipidemia, Diabetes, OSA on CPAP    REVIEW OF SYSTEMS: Full 14 system review of systems performed and negative with exception of: As noted in the HPI   ALLERGIES: No Known Allergies  HOME MEDICATIONS: Outpatient Medications Prior to Visit  Medication Sig Dispense Refill   acetaminophen (TYLENOL) 325 MG tablet Take 650 mg by mouth every 6 (six) hours as needed for mild pain, moderate pain, fever or headache.     amLODipine (NORVASC) 10 MG tablet Take 1 tablet (10 mg total) by mouth daily. 30 tablet 0   aspirin EC 81 MG tablet Take 81 mg by mouth daily.     atorvastatin (LIPITOR) 40 MG tablet Take 40 mg by mouth daily.  blood glucose meter kit and supplies KIT Dispense based on patient and insurance preference. Use up to four times daily as directed. 1 each 0   Calcium Carbonate-Vit D-Min (CALTRATE 600+D PLUS MINERALS) 600-800 MG-UNIT TABS Take 1 tablet by mouth in the morning and at bedtime.     Cholecalciferol (VITAMIN D3) 1000 units CAPS Take 1 capsule by mouth  daily.     Cranberry-Vitamin C-Probiotic (AZO CRANBERRY PO) Take 2 capsules by mouth daily.     folic acid (FOLVITE) 1 MG tablet Take 1 tablet (1 mg total) by mouth daily. 30 tablet 0   hydrochlorothiazide (HYDRODIURIL) 25 MG tablet Take 25 mg by mouth daily.  1   insulin glargine (LANTUS) 100 UNIT/ML Solostar Pen Inject 50 Units into the skin daily. 15 mL 0   insulin lispro (HUMALOG) 100 UNIT/ML KwikPen Inject 8 Units into the skin 3 (three) times daily. 15 mL 0   Insulin Pen Needle 32G X 4 MM MISC Lantus 50 units daily and Humalog 8 units TID 100 each 0   loratadine (CLARITIN) 10 MG tablet Take 10 mg by mouth daily.     losartan (COZAAR) 100 MG tablet Take 100 mg by mouth daily.     pantoprazole (PROTONIX) 40 MG tablet Take 40 mg by mouth daily.     POTASSIUM PO Take 99 mg by mouth daily as needed (for cramping (legs & feet)).     thiamine (VITAMIN B1) 100 MG tablet Take 1 tablet (100 mg total) by mouth daily. 30 tablet 0   No facility-administered medications prior to visit.    PAST MEDICAL HISTORY: Past Medical History:  Diagnosis Date   Allergic rhinitis    Coronary artery disease    Treated by Angioplasty   Diabetes mellitus without complication (HCC)    Type II   GERD (gastroesophageal reflux disease)    Herpes simplex    Hyperlipidemia    Hypertension    OSA (obstructive sleep apnea)    on CPAP   Sleep apnea    wears cpap nightly    PAST SURGICAL HISTORY: Past Surgical History:  Procedure Laterality Date   ABDOMINAL HYSTERECTOMY     ANGIOPLASTY     CARDIAC CATHETERIZATION     TUBAL LIGATION      FAMILY HISTORY: Family History  Problem Relation Age of Onset   Coronary artery disease Father    Lung cancer Mother    Diabetes Mother    Hypertension Sister    Diabetes Brother    Diabetes Maternal Grandmother    Colon cancer Neg Hx     SOCIAL HISTORY: Social History   Socioeconomic History   Marital status: Married    Spouse name: Not on file   Number of  children: Not on file   Years of education: Not on file   Highest education level: Not on file  Occupational History   Not on file  Tobacco Use   Smoking status: Never   Smokeless tobacco: Never  Substance and Sexual Activity   Alcohol use: Yes    Alcohol/week: 0.0 standard drinks of alcohol    Comment: rare   Drug use: No   Sexual activity: Not on file  Other Topics Concern   Not on file  Social History Narrative   Not on file   Social Determinants of Health   Financial Resource Strain: Not on file  Food Insecurity: No Food Insecurity (11/11/2021)   Hunger Vital Sign    Worried About Running Out of  Food in the Last Year: Never true    [place] in the Last Year: Never true  Transportation Needs: No Transportation Needs (11/17/2021)   PRAPARE - Hydrologist (Medical): No    Lack of Transportation (Non-Medical): No  Physical Activity: Not on file  Stress: Not on file  Social Connections: Not on file  Intimate Partner Violence: Not on file    PHYSICAL EXAM  GENERAL EXAM/CONSTITUTIONAL: Vitals:  Vitals:   01/06/22 1028  BP: (!) 157/74  Pulse: 67  Weight: 215 lb (97.5 kg)  Height: _0  (1.676 m)   Body mass index is 34.7 kg/m. Wt Readings from Last 3 Encounters:  01/06/22 215 lb (97.5 kg)  11/18/21 216 lb 4.3 oz (98.1 kg)  12/20/18 226 lb 6.4 oz (102.7 kg)   Patient is in no distress; well developed, nourished and groomed; neck is supple  EYES: Visual fields full to confrontation, Extraocular movements intacts,   MUSCULOSKELETAL: Gait, strength, tone, movements noted in Neurologic exam below  NEUROLOGIC: MENTAL STATUS:      No data to display             01/06/2022   10:36 AM  Montreal Cognitive Assessment   Visuospatial/ Executive (0/5) 3  Naming (0/3) 3  Attention: Read list of digits (0/2) 2  Attention: Read list of letters (0/1) 1  Attention: Serial 7 subtraction starting at 100 (0/3) 1  Language: Repeat  phrase (0/2) 2  Language : Fluency (0/1) 1  Abstraction (0/2) 2  Delayed Recall (0/5) 3  Orientation (0/6) 5  Total 23     CRANIAL NERVE:  2nd, 3rd, 4th, 6th - Visual fields full to confrontation, extraocular muscles intact, no nystagmus 5th - facial sensation symmetric 7th - facial strength symmetric 8th - hearing intact 9th - palate elevates symmetrically, uvula midline 11th - shoulder shrug symmetric 12th - tongue protrusion midline  MOTOR:  normal bulk and tone, full strength in the BUE, BLE  SENSORY:  normal and symmetric to light touch  COORDINATION:  finger-nose-finger, fine finger movements normal  GAIT/STATION:  normal   DIAGNOSTIC DATA (LABS, IMAGING, TESTING) - I reviewed patient records, labs, notes, testing and imaging myself where available.  Lab Results  Component Value Date   WBC 7.2 11/16/2021   HGB 10.7 (L) 11/16/2021   HCT 32.0 (L) 11/16/2021   MCV 85.3 11/16/2021   PLT 242 11/16/2021      Component Value Date/Time   NA 137 11/16/2021 0535   K 3.7 11/16/2021 0535   CL 98 11/16/2021 0535   CO2 25 11/16/2021 0535   GLUCOSE 217 (H) 11/16/2021 0535   BUN 18 11/16/2021 0535   CREATININE 1.00 11/16/2021 0535   CALCIUM 9.5 11/16/2021 0535   PROT 6.2 (L) 11/16/2021 0535   ALBUMIN 2.9 (L) 11/16/2021 0535   AST 29 11/16/2021 0535   ALT 29 11/16/2021 0535   ALKPHOS 56 11/16/2021 0535   BILITOT 0.7 11/16/2021 0535   GFRNONAA >60 11/16/2021 0535   GFRAA >60 09/03/2015 0347   Lab Results  Component Value Date   TRIG 124 11/15/2021   Lab Results  Component Value Date   HGBA1C 7.4 (H) 11/11/2021   Lab Results  Component Value Date   VITAMINB12 640 11/16/2021   Lab Results  Component Value Date   TSH 2.434 11/11/2021    MRI Brain 10/01/21 No evidence of acute intracranial abnormality. 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 Moderate chronic small vessel ischemic  changes within the cerebral white matter and pons. Chronic lacunar infarct within the right thalamus. Small chronic infarcts within the left cerebellar hemisphere.   CTA Head and Neck 11/11/21 1. No emergent finding including large vessel occlusion. 2. Atherosclerosis without flow limiting stenosis or ulceration of major vessels in the head and neck.   LTM EEG 11/11/21 -Intermittent slow, generalized -Background attenuation, generalized   ASSESSMENT AND PLAN  71 y.o. year old female with  hypertension, hyperlipidemia, diabetes mellitus, obstructive sleep sleep apnea on CPAP who was recently admitted for altered mental status found to be in DKA.  Patient was treated with insulin.  Her workup was negative for any CNS infection, negative for herpes encephalitis.  Her autoimmune/paraneoplastic panel came back and was negative for any antibodies.  Patient altered mental status was secondary to metabolic derangement.  She does continue to improve, doing physical therapy, so I encouraged her to continue with exercise, take her medications as prescribed and follow-up in a year.  She voices understanding.   1. Disorientation   2. Confusion and disorientation      Patient Instructions  Continue current medications  Continue with therapy  Follow up in a year  No orders of the defined types were placed in this encounter.   No orders of the defined types were placed in this encounter.   Return in about 1 year (around 01/07/2023).  I have spent a total of 50 minutes dedicated to this patient today, preparing to see patient, performing a medically appropriate examination and evaluation, ordering tests and/or medications and procedures, and counseling and educating the patient/family/caregiver; independently interpreting result and communicating results to the family/patient/caregiver; and documenting clinical information in the electronic medical record.   Alric Ran, MD 01/06/2022, 8:49  PM  [facility] [address] [place], [place] [address] [phone]

## 2022-02-17 ENCOUNTER — Other Ambulatory Visit: Payer: Self-pay | Admitting: Family Medicine

## 2022-02-17 DIAGNOSIS — E2839 Other primary ovarian failure: Secondary | ICD-10-CM

## 2022-03-06 ENCOUNTER — Ambulatory Visit
Admission: RE | Admit: 2022-03-06 | Discharge: 2022-03-06 | Disposition: A | Discharge status: Home / Self Care | Payer: Medicare Other | Source: Ambulatory Visit | Attending: Family Medicine | Admitting: Family Medicine

## 2022-03-06 DIAGNOSIS — E2839 Other primary ovarian failure: Secondary | ICD-10-CM

## 2022-03-11 ENCOUNTER — Telehealth: Payer: Self-pay | Admitting: Gastroenterology

## 2022-04-14 ENCOUNTER — Ambulatory Visit (AMBULATORY_SURGERY_CENTER): Payer: Medicare Other

## 2022-04-14 ENCOUNTER — Telehealth: Payer: Self-pay

## 2022-04-14 ENCOUNTER — Encounter: Payer: Self-pay | Admitting: Gastroenterology

## 2022-04-14 VITALS — Ht 66.0 in | Wt 215.0 lb

## 2022-04-14 DIAGNOSIS — Z86010 Personal history of colonic polyps: Secondary | ICD-10-CM

## 2022-04-14 MED ORDER — NA SULFATE-K SULFATE-MG SULF 17.5-3.13-1.6 GM/177ML PO SOLN: 1.0000 | Freq: Once | ORAL | 0 refills | Status: AC

## 2022-04-14 NOTE — Telephone Encounter (Signed)
Pt rescheduled for 2:30. Attempted contacted at 2:30 and 2:40 with no answer. Pt phone is going straight to VM. If patient calls back please ask pt to make sure she doesn't have unknown numbers blocked allowing our call not to come thru and make sure the correct phone number is on file. VM left for pt to call back to have her PV rescheduled. If we don't hear from her by 5 pm both her PV and procedure will be cancelled

## 2022-04-14 NOTE — Progress Notes (Signed)

## 2022-04-14 NOTE — Telephone Encounter (Signed)
Attempt at PV x 2. Unable to reach patient. VM left. Pt to call the office back by 5 PM to have PV rescheduled. Pt made aware that in the event that we do not hear back from them their PV and scheduled procedure will be cancelled.

## 2022-04-28 ENCOUNTER — Encounter: Payer: Self-pay | Admitting: Gastroenterology

## 2022-04-28 ENCOUNTER — Ambulatory Visit (AMBULATORY_SURGERY_CENTER): Payer: Medicare Other | Admitting: Gastroenterology

## 2022-04-28 VITALS — BP 122/50 | HR 63 | Temp 97.8°F | Resp 11 | Ht 66.0 in | Wt 215.0 lb

## 2022-04-28 DIAGNOSIS — Z09 Encounter for follow-up examination after completed treatment for conditions other than malignant neoplasm: Secondary | ICD-10-CM

## 2022-04-28 DIAGNOSIS — D122 Benign neoplasm of ascending colon: Secondary | ICD-10-CM

## 2022-04-28 DIAGNOSIS — D125 Benign neoplasm of sigmoid colon: Secondary | ICD-10-CM | POA: Diagnosis not present

## 2022-04-28 DIAGNOSIS — D120 Benign neoplasm of cecum: Secondary | ICD-10-CM

## 2022-04-28 DIAGNOSIS — Z86010 Personal history of colonic polyps: Secondary | ICD-10-CM

## 2022-04-28 DIAGNOSIS — K640 First degree hemorrhoids: Secondary | ICD-10-CM

## 2022-04-28 MED ORDER — SODIUM CHLORIDE 0.9 % IV SOLN: 500.0000 mL | INTRAVENOUS | Status: DC

## 2022-04-28 NOTE — Progress Notes (Signed)
Vss nad trans to pacu 

## 2022-04-28 NOTE — Patient Instructions (Signed)
Handouts provided about hemorrhoids and polyps.  Resume previous diet.  Continue present medications. Await pathology results.  Repeat colonoscopy for surveillance based on pathology results.    YOU HAD AN ENDOSCOPIC PROCEDURE TODAY AT THE [facility]:   Refer to the procedure report that was given to you for any specific questions about what was found during the examination.  If the procedure report does not answer your questions, please call your gastroenterologist to clarify.  If you requested that your care partner not be given the details of your procedure findings, then the procedure report has been included in a sealed envelope for you to review at your convenience later.  YOU SHOULD EXPECT: Some feelings of bloating in the abdomen. Passage of more gas than usual.  Walking can help get rid of the air that was put into your GI tract during the procedure and reduce the bloating. If you had a lower endoscopy (such as a colonoscopy or flexible sigmoidoscopy) you may notice spotting of blood in your stool or on the toilet paper. If you underwent a bowel prep for your procedure, you may not have a normal bowel movement for a few days.  Please Note:  You might notice some irritation and congestion in your nose or some drainage.  This is from the oxygen used during your procedure.  There is no need for concern and it should clear up in a day or so.  SYMPTOMS TO REPORT IMMEDIATELY:  Following lower endoscopy (colonoscopy or flexible sigmoidoscopy):  Excessive amounts of blood in the stool  Significant tenderness or worsening of abdominal pains  Swelling of the abdomen that is new, acute  Fever of 100F or higher   For urgent or emergent issues, a gastroenterologist can be reached at any hour by calling [phone]. Do not use MyChart messaging for urgent concerns.    DIET:  We do recommend a small meal at first, but then you may proceed to your regular diet.  Drink plenty of  fluids but you should avoid alcoholic beverages for 24 hours.  ACTIVITY:  You should plan to take it easy for the rest of today and you should NOT DRIVE or use heavy machinery until tomorrow (because of the sedation medicines used during the test).    FOLLOW UP: Our staff will call the number listed on your records the next business day following your procedure.  We will call around 7:15- 8:00 am to check on you and address any questions or concerns that you may have regarding the information given to you following your procedure. If we do not reach you, we will leave a message.     If any biopsies were taken you will be contacted by phone or by letter within the next 1-3 weeks.  Please call us at [phone] if you have not heard about the biopsies in 3 weeks.    SIGNATURES/CONFIDENTIALITY: You and/or your care partner have signed paperwork which will be entered into your electronic medical record.  These signatures attest to the fact that that the information above on your After Visit Summary has been reviewed and is understood.  Full responsibility of the confidentiality of this discharge information lies with you and/or your care-partner.  

## 2022-04-28 NOTE — Progress Notes (Signed)
GASTROENTEROLOGY PROCEDURE H&P NOTE   Primary Care Physician: Roselee Nova, MD    Reason for Procedure:  Colon Cancer screening, polyp surveillance  Plan:    Colonoscopy  Patient is appropriate for endoscopic procedure(s) in the ambulatory ([place]) setting.  The nature of the procedure, as well as the risks, benefits, and alternatives were carefully and thoroughly reviewed with the patient. Ample time for discussion and questions allowed. The patient understood, was satisfied, and agreed to proceed.     HPI: Victoria Jimenez is a 72 y.o. female who presents for colonoscopy for ongoing polyp surveillance and Colon Cancer screening.  No active GI symptoms.    Last colonoscopy was 12/2014 and notable for a 4 mm adenoma removed from the transverse colon and a 12 mm pedunculate adenoma removed from the sigmoid.   Past Medical History:  Diagnosis Date   Allergic rhinitis    Allergy    Anxiety    Coronary artery disease    Treated by Angioplasty   Diabetes mellitus without complication (HCC)    Type II   GERD (gastroesophageal reflux disease)    Herpes simplex    Hyperlipidemia    Hypertension    OSA (obstructive sleep apnea)    on CPAP   Sleep apnea    wears cpap nightly    Past Surgical History:  Procedure Laterality Date   ABDOMINAL HYSTERECTOMY     ANGIOPLASTY     CARDIAC CATHETERIZATION     TUBAL LIGATION      Prior to Admission medications   Medication Sig Start Date End Date Taking? Authorizing Provider  amLODipine (NORVASC) 10 MG tablet Take 1 tablet (10 mg total) by mouth daily. 11/18/21  Yes Aline August, MD  aspirin EC 81 MG tablet Take 81 mg by mouth daily.   Yes [provider]  atorvastatin (LIPITOR) 40 MG tablet Take 40 mg by mouth daily. 07/14/21  Yes [provider]  blood glucose meter kit and supplies KIT Dispense based on patient and insurance preference. Use up to four times daily as directed. 11/18/21  Yes Aline August, MD  Calcium Carbonate-Vit D-Min (CALTRATE 600+D PLUS MINERALS) 600-800 MG-UNIT TABS Take 1 tablet by mouth in the morning and at bedtime.   Yes [provider]  Cholecalciferol (VITAMIN D3) 1000 units CAPS Take 1 capsule by mouth daily.   Yes [provider]  folic acid (FOLVITE) 1 MG tablet Take 1 tablet (1 mg total) by mouth daily. 11/18/21  Yes Aline August, MD  hydrochlorothiazide (HYDRODIURIL) 25 MG tablet Take 25 mg by mouth daily. 02/05/16  Yes [provider]  insulin glargine (LANTUS) 100 UNIT/ML Solostar Pen Inject 50 Units into the skin daily. 11/18/21  Yes Aline August, MD  insulin glargine, 2 Unit Dial, (TOUJEO MAX SOLOSTAR) 300 UNIT/ML Solostar Pen Inject into the skin.   Yes [provider]  Insulin Pen Needle 32G X 4 MM MISC Lantus 50 units daily and Humalog 8 units TID 11/18/21  Yes Alekh, Kshitiz, MD  loratadine (CLARITIN) 10 MG tablet Take 10 mg by mouth daily.   Yes [provider]  losartan (COZAAR) 100 MG tablet Take 100 mg by mouth daily. 07/14/21  Yes [provider]  pantoprazole (PROTONIX) 40 MG tablet Take 40 mg by mouth daily.   Yes [provider]  POTASSIUM PO Take 99 mg by mouth daily as needed (for cramping (legs & feet)).   Yes [provider]  acetaminophen (TYLENOL) 325  MG tablet Take 650 mg by mouth every 6 (six) hours as needed for mild pain, moderate pain, fever or headache.    [provider]  Cranberry-Vitamin C-Probiotic (AZO CRANBERRY PO) Take 2 capsules by mouth daily.    [provider]  insulin lispro (HUMALOG) 100 UNIT/ML KwikPen Inject 8 Units into the skin 3 (three) times daily. Patient not taking: Reported on 04/28/2022 11/18/21   Aline August, MD  thiamine (VITAMIN B1) 100 MG tablet Take 1 tablet (100 mg total) by mouth daily. Patient not taking: Reported on 04/28/2022 11/18/21   Aline August, MD    Current Outpatient Medications  Medication Sig  Dispense Refill   amLODipine (NORVASC) 10 MG tablet Take 1 tablet (10 mg total) by mouth daily. 30 tablet 0   aspirin EC 81 MG tablet Take 81 mg by mouth daily.     atorvastatin (LIPITOR) 40 MG tablet Take 40 mg by mouth daily.     blood glucose meter kit and supplies KIT Dispense based on patient and insurance preference. Use up to four times daily as directed. 1 each 0   Calcium Carbonate-Vit D-Min (CALTRATE 600+D PLUS MINERALS) 600-800 MG-UNIT TABS Take 1 tablet by mouth in the morning and at bedtime.     Cholecalciferol (VITAMIN D3) 1000 units CAPS Take 1 capsule by mouth daily.     folic acid (FOLVITE) 1 MG tablet Take 1 tablet (1 mg total) by mouth daily. 30 tablet 0   hydrochlorothiazide (HYDRODIURIL) 25 MG tablet Take 25 mg by mouth daily.  1   insulin glargine (LANTUS) 100 UNIT/ML Solostar Pen Inject 50 Units into the skin daily. 15 mL 0   insulin glargine, 2 Unit Dial, (TOUJEO MAX SOLOSTAR) 300 UNIT/ML Solostar Pen Inject into the skin.     Insulin Pen Needle 32G X 4 MM MISC Lantus 50 units daily and Humalog 8 units TID 100 each 0   loratadine (CLARITIN) 10 MG tablet Take 10 mg by mouth daily.     losartan (COZAAR) 100 MG tablet Take 100 mg by mouth daily.     pantoprazole (PROTONIX) 40 MG tablet Take 40 mg by mouth daily.     POTASSIUM PO Take 99 mg by mouth daily as needed (for cramping (legs & feet)).     acetaminophen (TYLENOL) 325 MG tablet Take 650 mg by mouth every 6 (six) hours as needed for mild pain, moderate pain, fever or headache.     Cranberry-Vitamin C-Probiotic (AZO CRANBERRY PO) Take 2 capsules by mouth daily.     insulin lispro (HUMALOG) 100 UNIT/ML KwikPen Inject 8 Units into the skin 3 (three) times daily. (Patient not taking: Reported on 04/28/2022) 15 mL 0   thiamine (VITAMIN B1) 100 MG tablet Take 1 tablet (100 mg total) by mouth daily. (Patient not taking: Reported on 04/28/2022) 30 tablet 0   Current Facility-Administered Medications  Medication Dose Route  Frequency Provider Last Rate Last Admin   0.9 %  sodium chloride infusion  500 mL Intravenous Continuous Ottis Vacha V, DO        Allergies as of 04/28/2022   (No Known Allergies)    Family History  Problem Relation Age of Onset   Lung cancer Mother    Diabetes Mother    Coronary artery disease Father    Hypertension Sister    Diabetes Brother    Diabetes Maternal Grandmother    Colon cancer Neg Hx    Colon polyps Neg Hx    Esophageal cancer Neg  Hx    Rectal cancer Neg Hx    Stomach cancer Neg Hx     Social History   Socioeconomic History   Marital status: Married    Spouse name: Not on file   Number of children: Not on file   Years of education: Not on file   Highest education level: Not on file  Occupational History   Not on file  Tobacco Use   Smoking status: Never   Smokeless tobacco: Never  Substance and Sexual Activity   Alcohol use: Yes    Alcohol/week: 0.0 standard drinks of alcohol    Comment: rare   Drug use: No   Sexual activity: Not on file  Other Topics Concern   Not on file  Social History Narrative   Not on file   Social Determinants of Health   Financial Resource Strain: Not on file  Food Insecurity: No Food Insecurity (11/11/2021)   Hunger Vital Sign    Worried About Running Out of Food in the Last Year: Never true    Ran Out of Food in the Last Year: Never true  Transportation Needs: No Transportation Needs (11/17/2021)   PRAPARE - Hydrologist (Medical): No    Lack of Transportation (Non-Medical): No  Physical Activity: Not on file  Stress: Not on file  Social Connections: Not on file  Intimate Partner Violence: Not on file    Physical Exam: Vital signs in last 24 hours: @BP  (!) 139/55   Pulse 62   Temp 97.8 F (36.6 C) (Temporal)   Ht 5\' 6"  (1.676 m)   Wt 215 lb (97.5 kg)   SpO2 98%   BMI 34.70 kg/m  GEN: NAD EYE: Sclerae anicteric ENT: MMM CV: Non-tachycardic Pulm: CTA b/l GI: Soft,  NT/ND NEURO:  Alert & Oriented x 3   Gerrit Heck, DO [facility]   04/28/2022 10:39 AM

## 2022-04-28 NOTE — Progress Notes (Signed)
Called to room to assist during endoscopic procedure.  Patient ID and intended procedure confirmed with present staff. Received instructions for my participation in the procedure from the performing physician.  

## 2022-04-28 NOTE — Progress Notes (Signed)
Pt's states no medical or surgical changes since previsit or office visit. 

## 2022-04-28 NOTE — Op Note (Signed)
[facility] Patient Name: Victoria Jimenez Procedure Date: 04/28/2022 10:31 AM MRN: [id number] Endoscopist: Gerrit Heck , MD, [id number] Age: 72 Referring MD:  Date of Birth: 1951-01-30 Gender: Female Account #: [id number] Procedure:                Colonoscopy Indications:              Surveillance: Personal history of adenomatous                            polyps on last colonoscopy > 5 years ago                           Last colonoscopy was 12/2014 and notable for a 4 mm                            adenoma removed from the transverse colon and a 12                            mm pedunculate adenoma removed from the sigmoid. Medicines:                Monitored Anesthesia Care Procedure:                Pre-Anesthesia Assessment:                           - Prior to the procedure, a History and Physical                            was performed, and patient medications and                            allergies were reviewed. The patient's tolerance of                            previous anesthesia was also reviewed. The risks                            and benefits of the procedure and the sedation                            options and risks were discussed with the patient.                            All questions were answered, and informed consent                            was obtained. Prior Anticoagulants: The patient has                            taken no anticoagulant or antiplatelet agents. ASA                            Grade Assessment: III - A patient with severe  systemic disease. After reviewing the risks and                            benefits, the patient was deemed in satisfactory                            condition to undergo the procedure.                           After obtaining informed consent, the colonoscope                            was passed under direct vision. Throughout the                            procedure,  the patient's blood pressure, pulse, and                            oxygen saturations were monitored continuously. The                            Olympus SN [id number] was introduced through the anus                            and advanced to the the cecum, identified by                            appendiceal orifice and ileocecal valve. The                            colonoscopy was technically difficult and complex                            due to significant looping. Successful completion                            of the procedure was aided by applying abdominal                            pressure. The patient tolerated the procedure well.                            After copious irrigation, the quality of the bowel                            preparation was adequate. The ileocecal valve,                            appendiceal orifice, and rectum were photographed. Scope In: 10:47:26 AM Scope Out: 11:22:32 AM Scope Withdrawal Time: 0 hours 15 minutes 43 seconds  Total Procedure Duration: 0 hours 35 minutes 6 seconds  Findings:                 The perianal and digital rectal examinations were  normal.                           Six sessile polyps were found in the sigmoid colon                            (3), ascending colon (2), and cecum (1). The polyps                            were 4 to 8 mm in size. These polyps were removed                            with a cold snare. Resection and retrieval were                            complete. Estimated blood loss was minimal.                           There was a small lipoma, in the sigmoid colon.                           The ascending colon revealed significantly                            excessive looping. Advancing the scope required                            using manual pressure.                           Non-bleeding internal hemorrhoids were found during                            retroflexion. The  hemorrhoids were small. Complications:            No immediate complications. Estimated Blood Loss:     Estimated blood loss was minimal. Impression:               - Six 4 to 8 mm polyps in the sigmoid colon, in the                            ascending colon and in the cecum, removed with a                            cold snare. Resected and retrieved.                           - Small lipoma in the sigmoid colon.                           - There was significant looping of the colon.                           - Non-bleeding internal hemorrhoids.                           -  The GI Genius (intelligent endoscopy module),                            computer-aided polyp detection system powered by AI                            was utilized to detect colorectal polyps through                            enhanced visualization during colonoscopy. Recommendation:           - Patient has a contact number available for                            emergencies. The signs and symptoms of potential                            delayed complications were discussed with the                            patient. Return to normal activities tomorrow.                            Written discharge instructions were provided to the                            patient.                           - Resume previous diet.                           - Continue present medications.                           - Await pathology results.                           - Repeat colonoscopy for surveillance based on                            pathology results.                           - Return to GI clinic PRN. Gerrit Heck, MD 04/28/2022 11:31:00 AM

## 2022-05-04 ENCOUNTER — Encounter: Payer: Self-pay | Admitting: Gastroenterology

## 2022-07-12 IMAGING — MG DIGITAL SCREENING BILAT W/ TOMO W/ CAD
8 series · 8 of 24 positions shown · non-contrast
Comparison: Previous exam(s).

ACR Breast Density Category a: The breast tissue is almost entirely
fatty.

CLINICAL DATA: Screening.

EXAM:
DIGITAL SCREENING BILATERAL MAMMOGRAM WITH TOMO AND CAD

[L MLO synth-2D]
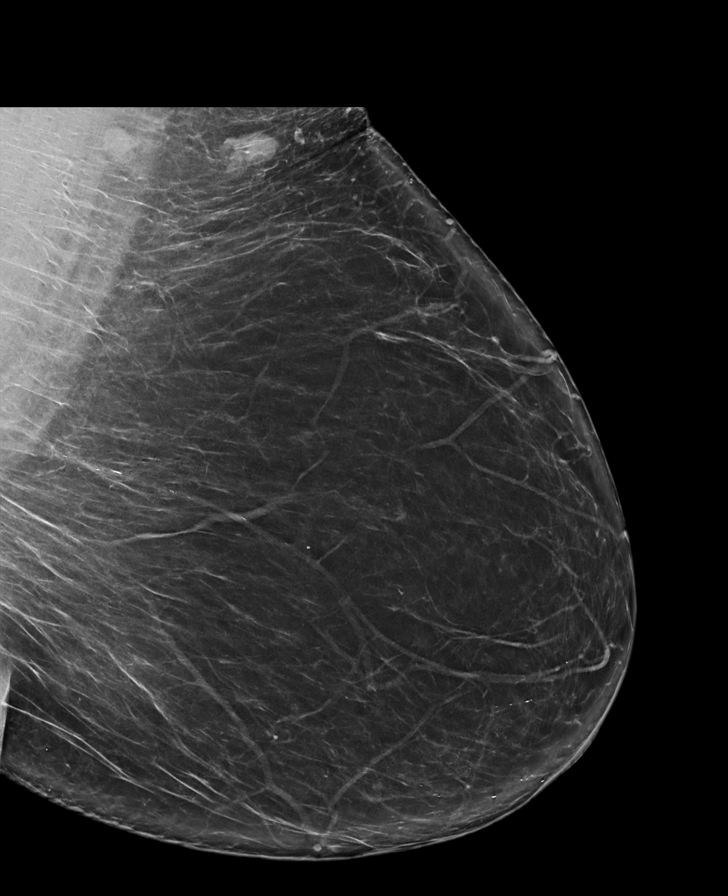

[R MLO synth-2D]
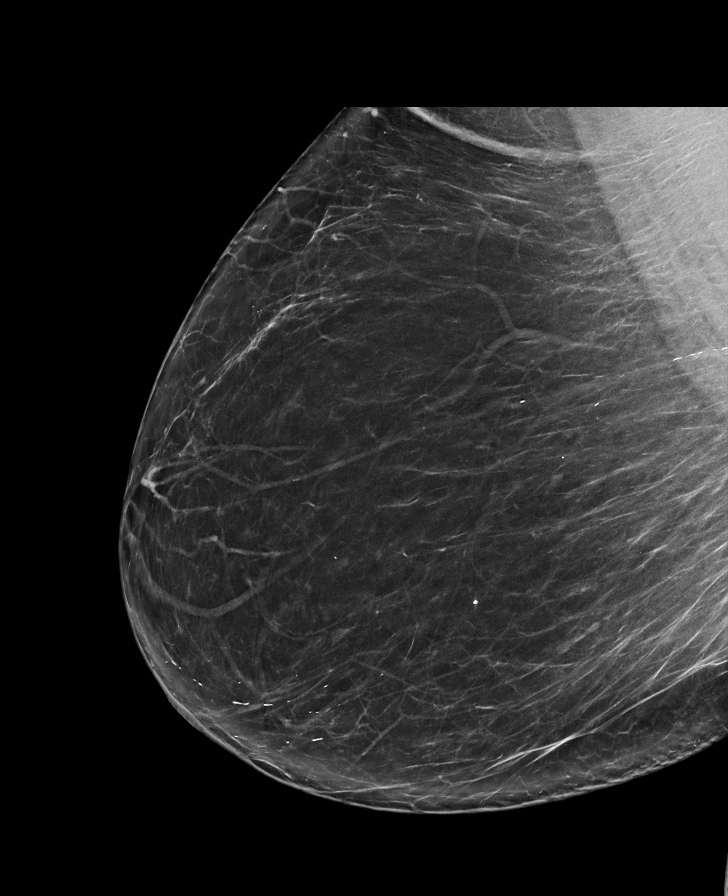

[L CC synth-2D]
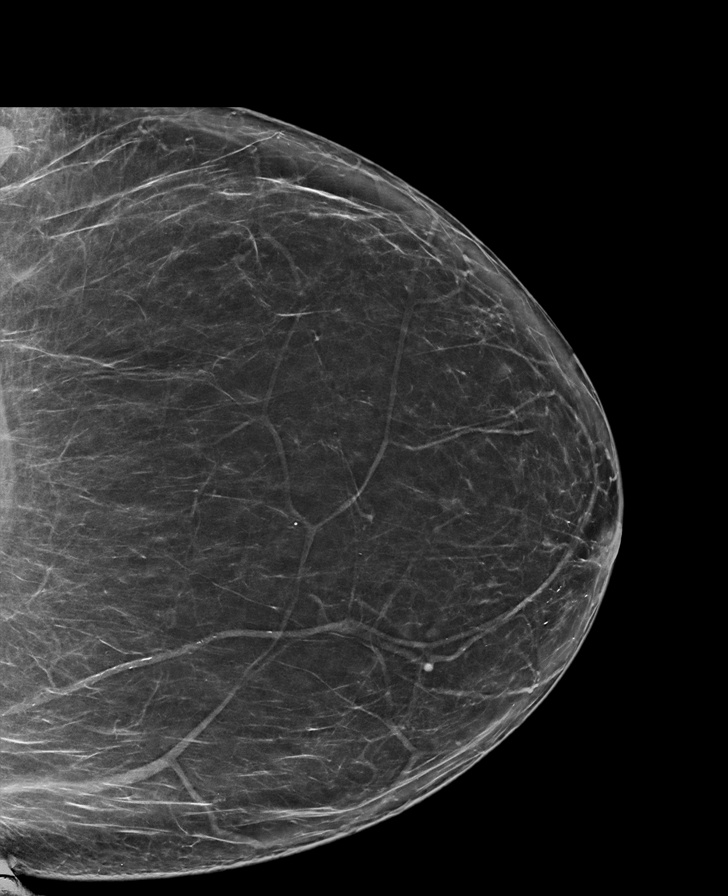

[R CC synth-2D]
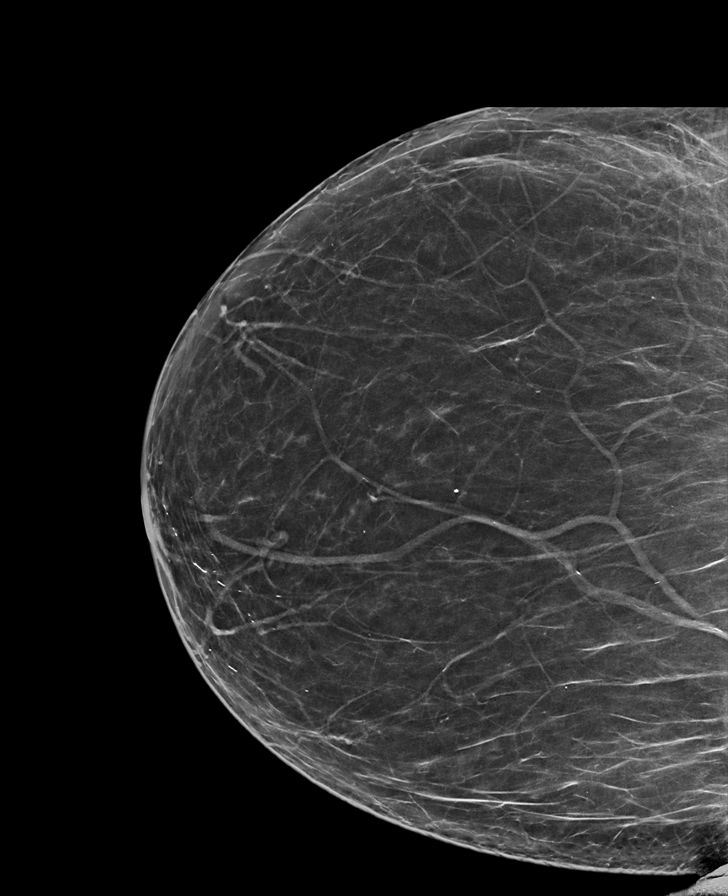

[R MLO tomo · tomo slice 43/86.0]
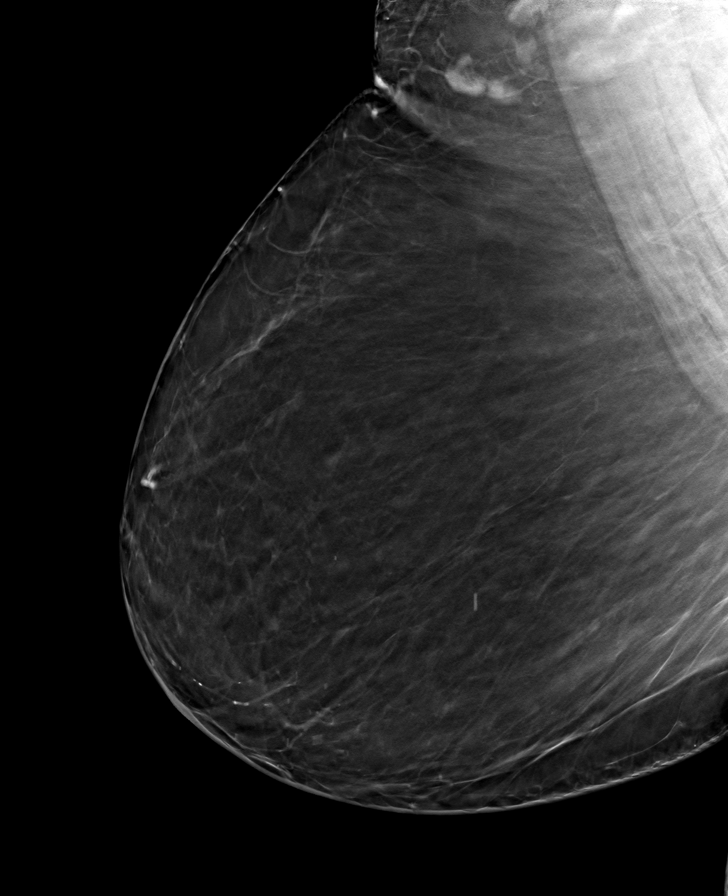

[L MLO tomo · tomo slice 44/87.0]
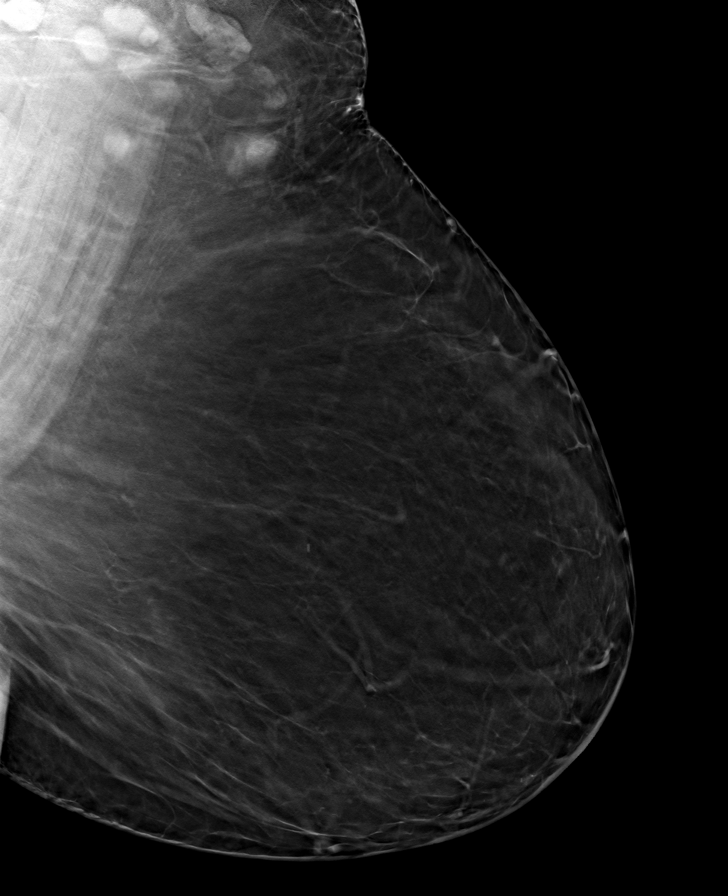

[L CC tomo · tomo slice 39/78.0]
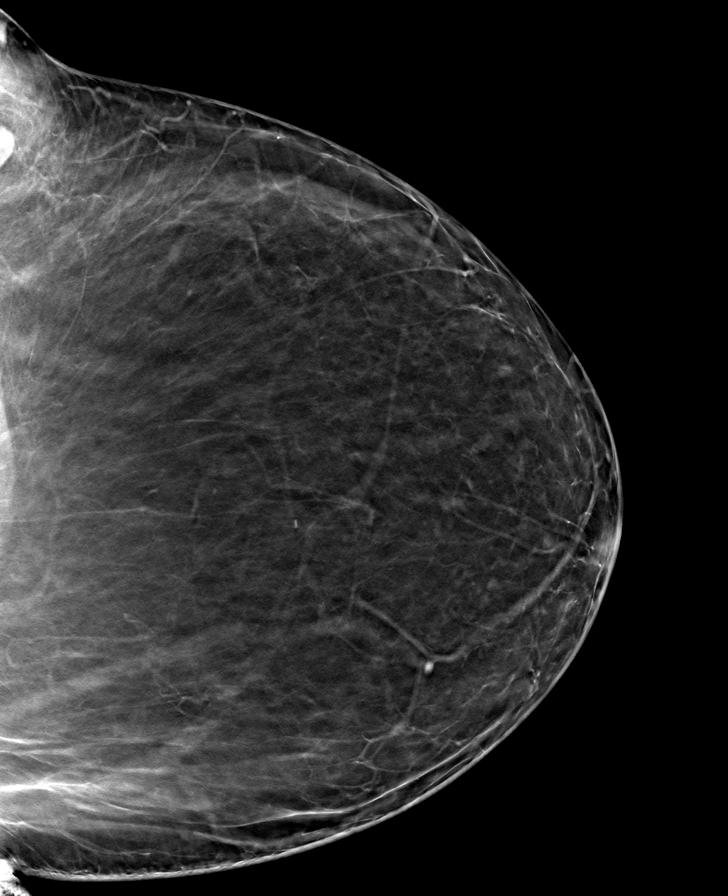

[R CC tomo · tomo slice 37/74.0]
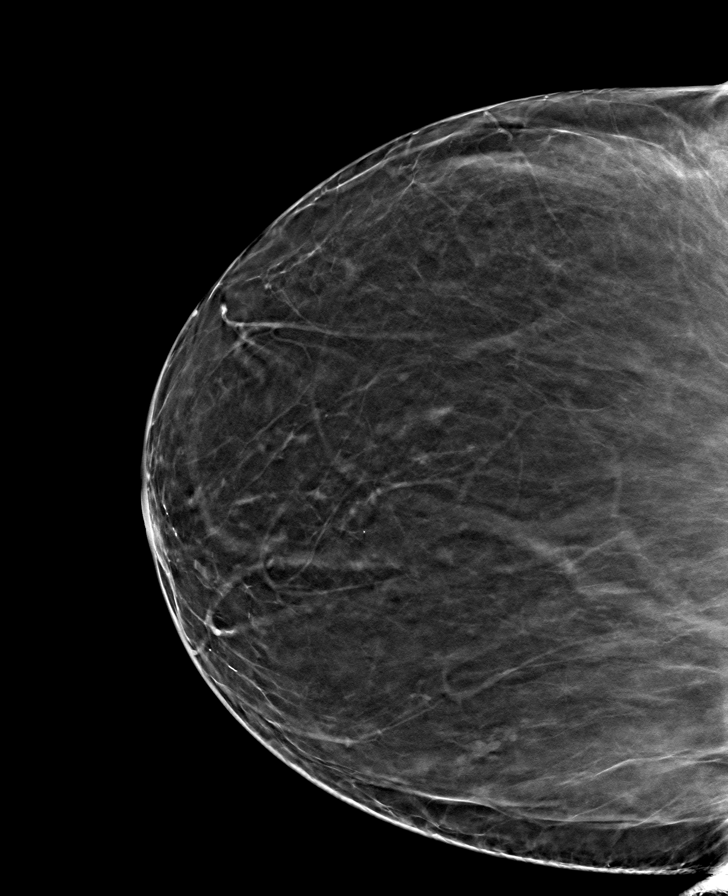

[8 of 24 positions shown; findings below may reference images not displayed]

FINDINGS: There are no findings suspicious for malignancy. Images were
processed with CAD.
IMPRESSION: No mammographic evidence of malignancy. A result letter of this
screening mammogram will be mailed directly to the patient.

RECOMMENDATION:
Screening mammogram in one year. (Code:[id number]-[id number])

BI-RADS CATEGORY  1: Negative.

## 2023-01-01 IMAGING — CR DG SHOULDER 2+V*L*
3 series · 3 of 3 positions shown · non-contrast
Comparison: None

CLINICAL DATA: LEFT shoulder pain down back of LEFT arm for 3 days
after lifting from bathtub

EXAM:
LEFT SHOULDER - 2+ VIEW

[w shoulder grashey left]
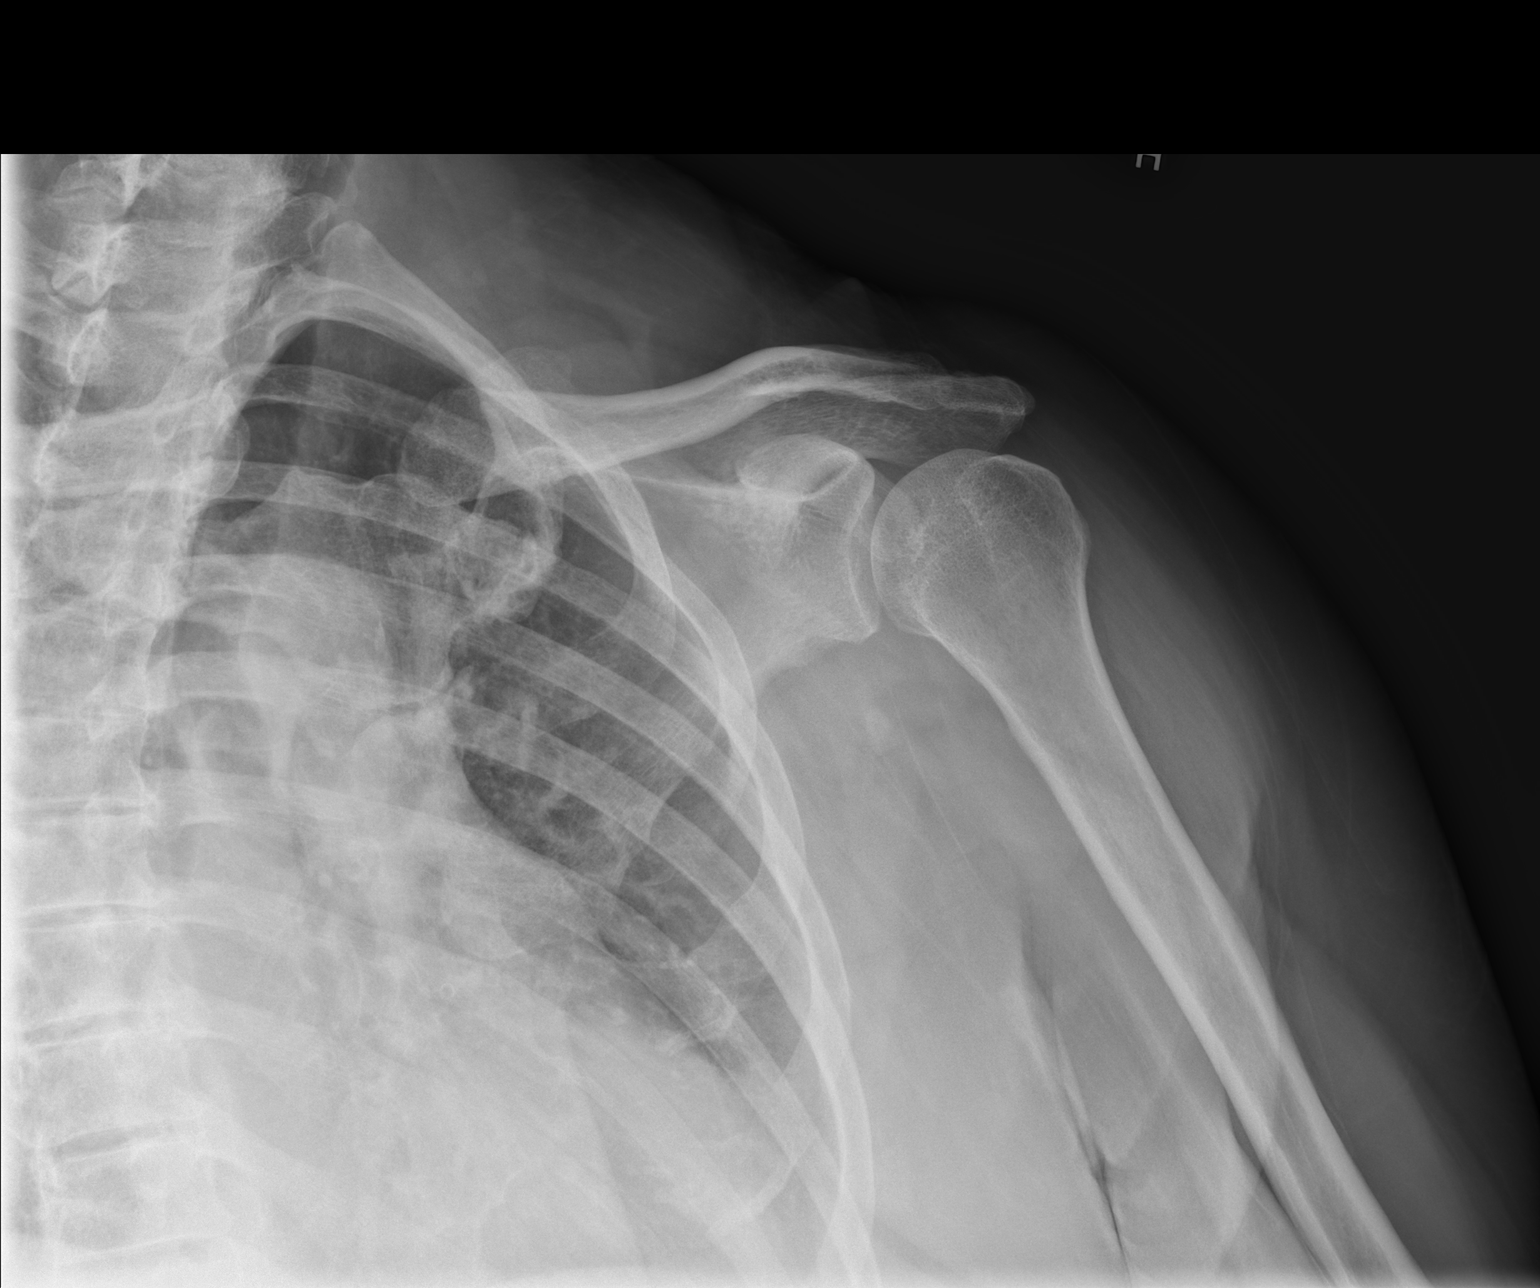

[w shoulder y-view left]
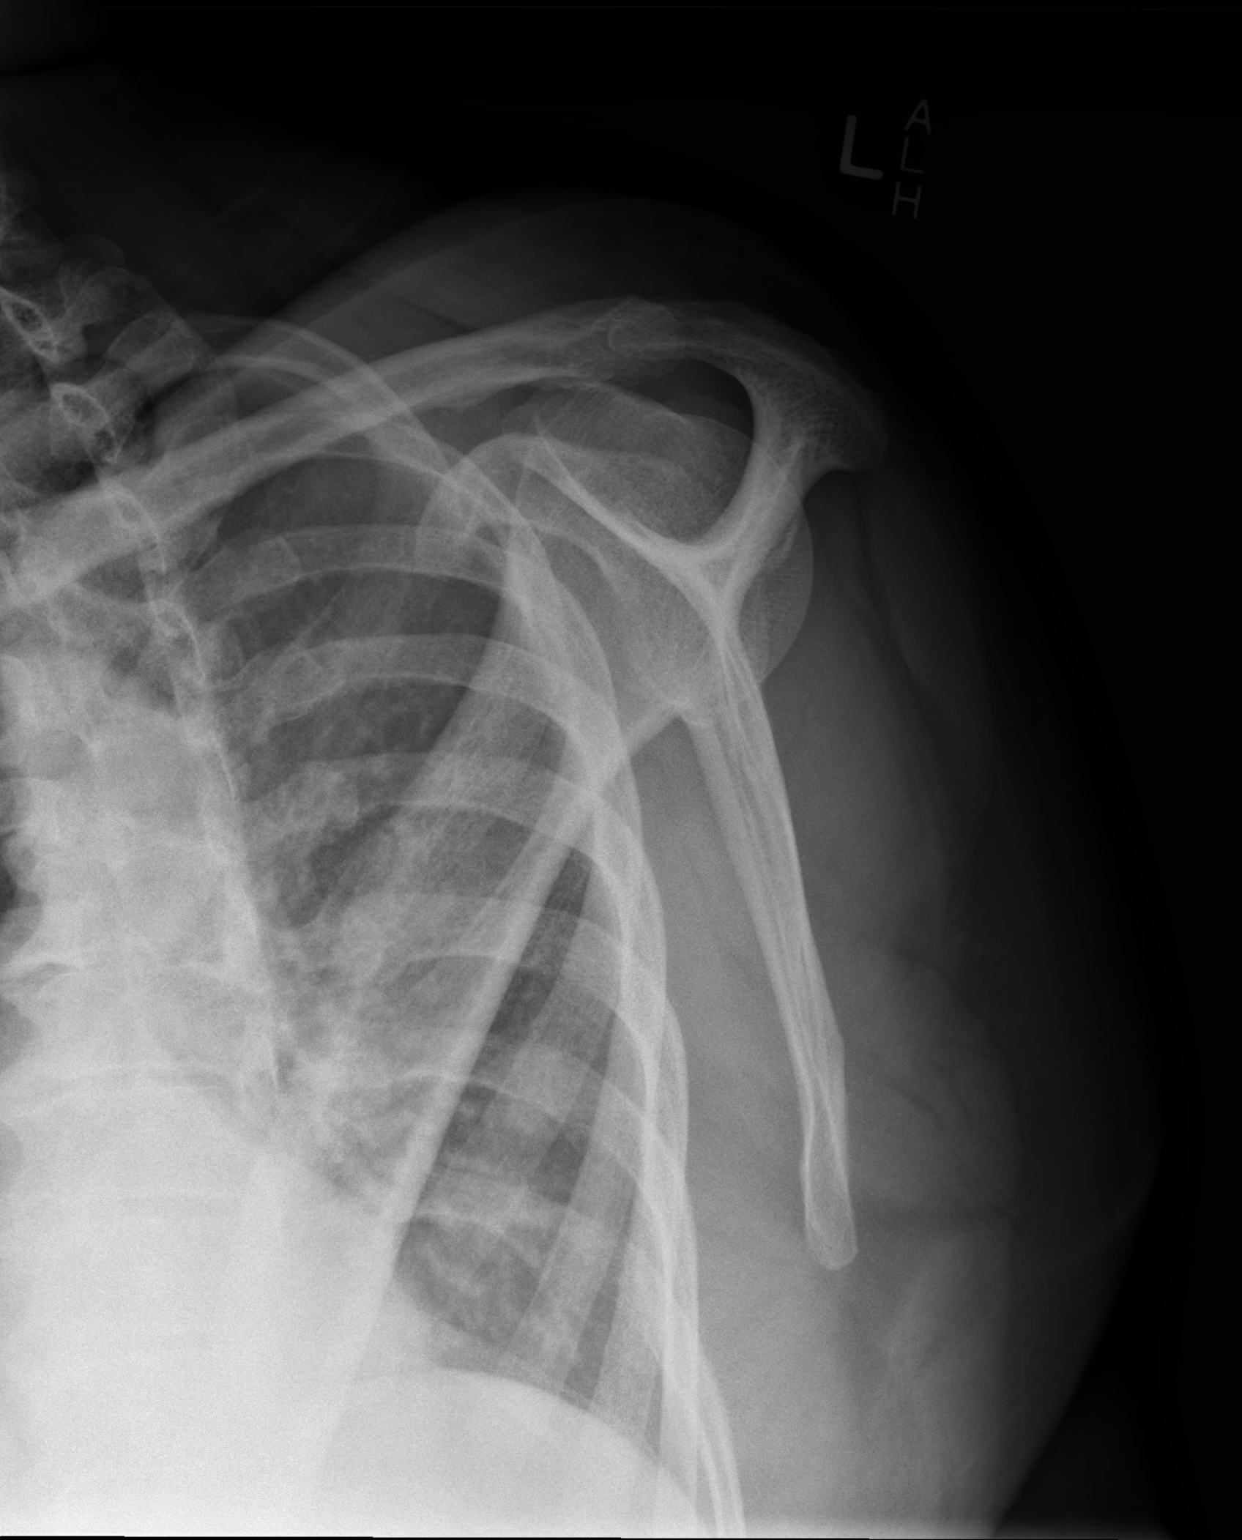

[w shoulder axillary left]
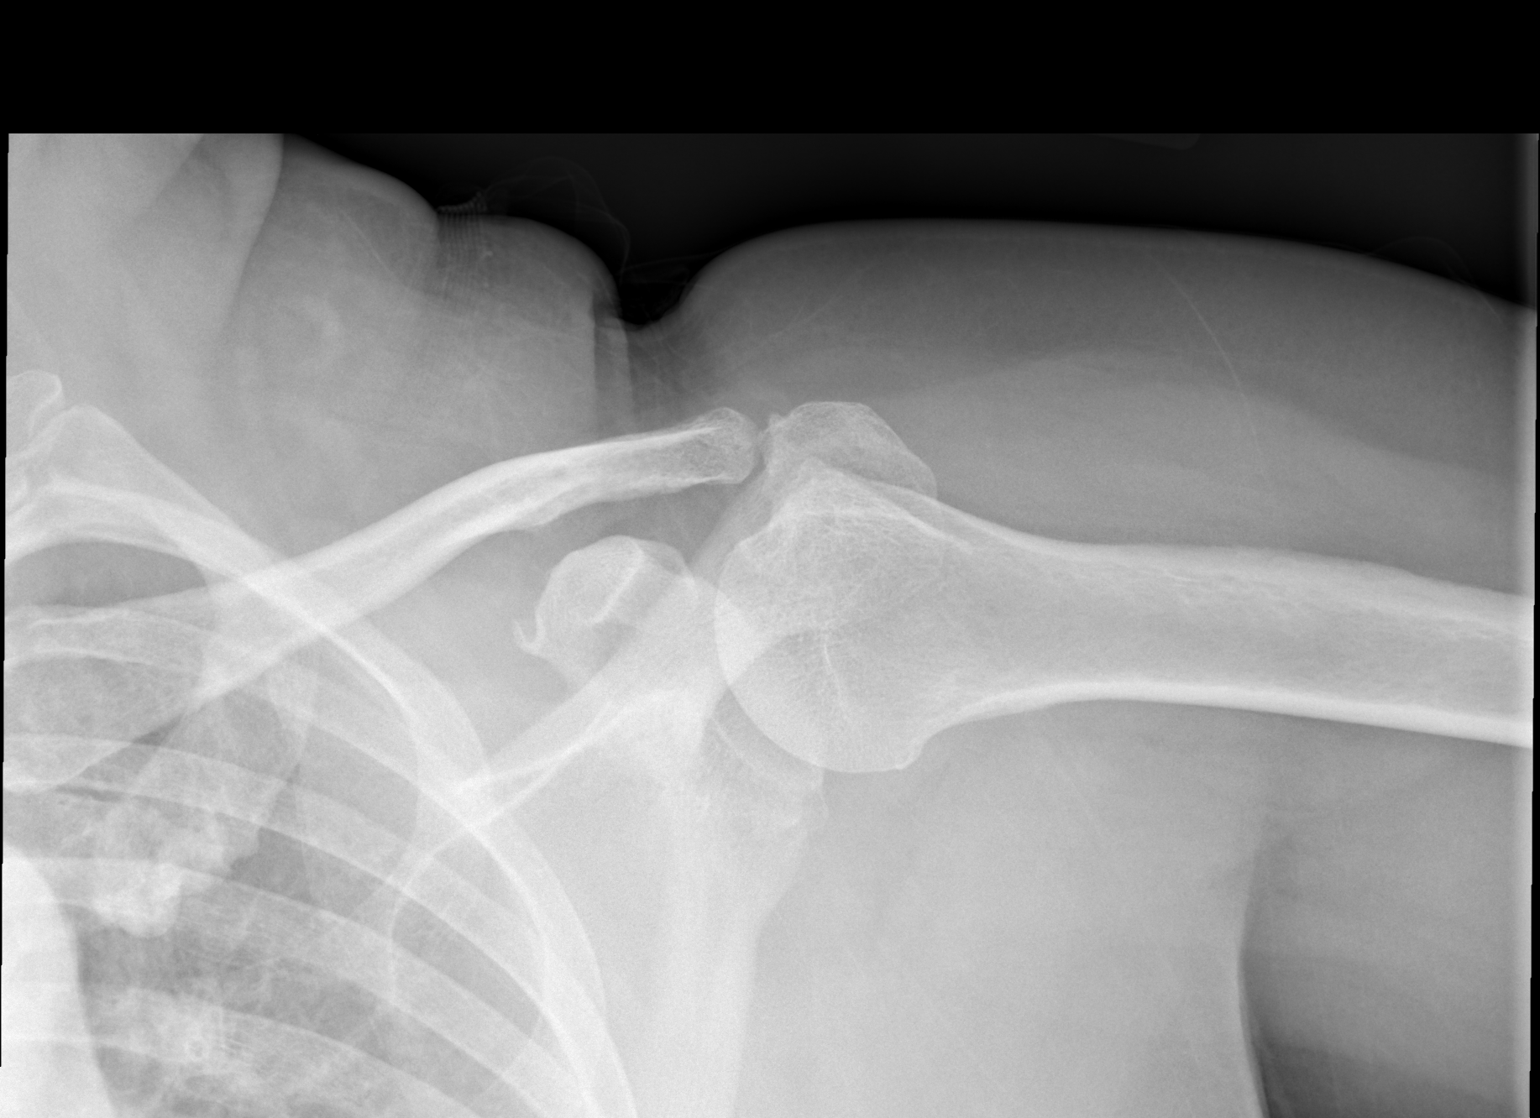

[3 of 3 positions shown; findings below may reference images not displayed]

FINDINGS: Osseous mineralization normal.

AC joint alignment normal.

Visualized ribs intact.

No acute fracture, dislocation, or bone destruction.
IMPRESSION: Normal exam.

## 2023-01-11 ENCOUNTER — Encounter: Payer: Self-pay | Admitting: Neurology

## 2023-01-11 ENCOUNTER — Ambulatory Visit: Payer: Medicare Other | Admitting: Neurology

## 2023-01-11 VITALS — BP 156/69 | HR 75 | Ht 66.0 in | Wt 231.0 lb

## 2023-01-11 DIAGNOSIS — G3184 Mild cognitive impairment, so stated: Secondary | ICD-10-CM | POA: Diagnosis not present

## 2023-01-11 DIAGNOSIS — R269 Unspecified abnormalities of gait and mobility: Secondary | ICD-10-CM | POA: Diagnosis not present

## 2023-01-11 NOTE — Patient Instructions (Signed)
Continue current medications Continue to follow with PCP Refer to physical therapy for gait training Return as needed.  There are well-accepted and sensible ways to reduce risk for Alzheimers disease and other degenerative brain disorders .  Exercise Daily Walk A daily 20 minute walk should be part of your routine. Disease related apathy can be a significant roadblock to exercise and the only way to overcome this is to make it a daily routine and perhaps have a reward at the end (something your loved one loves to eat or drink perhaps) or a personal trainer coming to the home can also be very useful. Most importantly, the patient is much more likely to exercise if the caregiver / spouse does it with him/her. In general a structured, repetitive schedule is best.  General Health: Any diseases which effect your body will effect your brain such as a pneumonia, urinary infection, blood clot, heart attack or stroke. Keep contact with your primary care doctor for regular follow ups.  Sleep. A good nights sleep is healthy for the brain. Seven hours is recommended. If you have insomnia or poor sleep habits we can give you some instructions. If you have sleep apnea wear your mask.  Diet: Eating a heart healthy diet is also a good idea; fish and poultry instead of red meat, nuts (mostly non-peanuts), vegetables, fruits, olive oil or canola oil (instead of butter), minimal salt (use other spices to flavor foods), whole grain rice, bread, cereal and pasta and wine in moderation.Research is now showing that the MIND diet, which is a combination of The Mediterranean diet and the DASH diet, is beneficial for cognitive processing and longevity. Information about this diet can be found in The MIND Diet, a book by Alonna Minium, MS, RDN, and online at [employer]  Finances, Power of [address] and Advance Directives: You should consider putting legal safeguards in place with regard to  financial and medical decision making. While the spouse always has power of attorney for medical and financial issues in the absence of any form, you should consider what you want in case the spouse / caregiver is no longer around or capable of making decisions.

## 2023-01-11 NOTE — Progress Notes (Signed)
[facility]  PATIENT: Victoria Jimenez DOB: 06-09-1950  REQUESTING CLINICIAN: Combs, Prince Solian, * HISTORY FROM: Patient and chart review  REASON FOR VISIT: Altered mental status    HISTORICAL  CHIEF COMPLAINT:  Chief Complaint  Patient presents with   Follow-up    Rm12, alone, Memory loss: moca was 18/30    INTERVAL HISTORY 01/11/2023:  Patient presents today for follow-up, last visit was a year ago.  She is alone today.  Since last visit a year ago, she tells me that she continues to improve.  Denies any new episode of altered mental status.  She reports having a Dexcom, she monitoring her glucose constantly, has not had any episode of high hyperglycemia.  She tells me that her memory is not what it used to be.  She does have word finding difficulty and sometimes she is forgetful.  She does live with her husband and daughter.  At home she does cook but not as much as previously.  She continues to drive, denies any recent accident or being lost in familiar places.  She does take her medications by herself she does not need any help with self-care.    HISTORY OF PRESENT ILLNESS:  This is a 72 year old woman past medical history of hypertension, hyperlipidemia, diabetes mellitus, obstructive sleep sleep apnea on CPAP who is presenting after being admitted to the hospital for altered mental status found to be in DKA.  Patient reports she does not remember why she was admitted in the hospital, she does not remember her hospitalization, she has a vague memory but is unclear.  She was told by family that she was confused, she thought that her hospital bed was her car and she also thought that she was in her house while she was in the hospital.  History limited but chart reviewed, Patient reports since discharge from the hospital, she continued to improve.  She is getting physical and occasional therapy weekly, her main complaint is dizziness in the morning but after  a few hours to subside.  She also reports urinary incontinence, denies any recent UTI.   Hospital Summary and Course 72 year old female with past medical history of HSV, hyperlipidemia, CAD, diabetes mellitus type 2 presented with altered mental status and was initially hypertensive requiring intubation and sedation and admission to ICU.  She was found to be in DKA, treated with IV fluids/insulin drip and subsequently transitioned to long-acting insulin.  Neurology was consulted.  MRI was negative for acute stroke; EEG negative for seizures; LP was inconclusive.  She was extubated on 11/13/2021.  She was transferred to [facility] service from 11/15/2021 onwards.  Subsequently, her overall condition has gradually improved.  PT initially recommended CIR placement but later recommended home health PT.  Her blood sugars are elevated but improving.  Mental status has much improved, still slow to respond but currently appropriate.  Will need neurology follow-up as an outpatient.  She will be discharged home today on long-acting and short acting insulin.   Acute metabolic encephalopathy -Work-up so far has been negative including MRI brain/EEG with LP being inconclusive.  Ammonia and TSH normal.  Neurology signed off on 11/15/2021.  Neurology not worried about HSV encephalitis since HSV PCR was negative; awaiting results of autoimmune encephalitis panel.  Outpatient follow-up with neurology. -Mental status has more improved.  Still slightly slow to respond.   Treated with high dose thiamine: We will discharge home on thiamine 100 mg daily along with folic acid 1 mg daily -  B12 level 640.    OTHER MEDICAL CONDITIONS: Hypertension, hyperlipidemia, Diabetes, OSA on CPAP    REVIEW OF SYSTEMS: Full 14 system review of systems performed and negative with exception of: As noted in the HPI   ALLERGIES: No Known Allergies  HOME MEDICATIONS: Outpatient Medications Prior to Visit  Medication Sig Dispense Refill    acetaminophen (TYLENOL) 325 MG tablet Take 650 mg by mouth every 6 (six) hours as needed for mild pain, moderate pain, fever or headache.     amLODipine (NORVASC) 10 MG tablet Take 1 tablet (10 mg total) by mouth daily. 30 tablet 0   aspirin EC 81 MG tablet Take 81 mg by mouth daily.     atorvastatin (LIPITOR) 40 MG tablet Take 40 mg by mouth daily.     blood glucose meter kit and supplies KIT Dispense based on patient and insurance preference. Use up to four times daily as directed. 1 each 0   Calcium Carbonate-Vit D-Min (CALTRATE 600+D PLUS MINERALS) 600-800 MG-UNIT TABS Take 1 tablet by mouth in the morning and at bedtime.     Cholecalciferol (VITAMIN D3) 1000 units CAPS Take 1 capsule by mouth daily.     Cranberry-Vitamin C-Probiotic (AZO CRANBERRY PO) Take 2 capsules by mouth daily.     folic acid (FOLVITE) 1 MG tablet Take 1 tablet (1 mg total) by mouth daily. 30 tablet 0   hydrochlorothiazide (HYDRODIURIL) 25 MG tablet Take 25 mg by mouth daily.  1   insulin glargine (LANTUS) 100 UNIT/ML Solostar Pen Inject 50 Units into the skin daily. 15 mL 0   insulin glargine, 2 Unit Dial, (TOUJEO MAX SOLOSTAR) 300 UNIT/ML Solostar Pen Inject into the skin.     insulin lispro (HUMALOG) 100 UNIT/ML KwikPen Inject 8 Units into the skin 3 (three) times daily. 15 mL 0   Insulin Pen Needle 32G X 4 MM MISC Lantus 50 units daily and Humalog 8 units TID 100 each 0   loratadine (CLARITIN) 10 MG tablet Take 10 mg by mouth daily.     losartan (COZAAR) 100 MG tablet Take 100 mg by mouth daily.     pantoprazole (PROTONIX) 40 MG tablet Take 40 mg by mouth daily.     POTASSIUM PO Take 99 mg by mouth daily as needed (for cramping (legs & feet)).     thiamine (VITAMIN B1) 100 MG tablet Take 1 tablet (100 mg total) by mouth daily. 30 tablet 0   No facility-administered medications prior to visit.    PAST MEDICAL HISTORY: Past Medical History:  Diagnosis Date   Allergic rhinitis    Allergy    Anxiety    Coronary  artery disease    Treated by Angioplasty   Diabetes mellitus without complication (HCC)    Type II   GERD (gastroesophageal reflux disease)    Herpes simplex    Hyperlipidemia    Hypertension    OSA (obstructive sleep apnea)    on CPAP   Sleep apnea    wears cpap nightly    PAST SURGICAL HISTORY: Past Surgical History:  Procedure Laterality Date   ABDOMINAL HYSTERECTOMY     ANGIOPLASTY     CARDIAC CATHETERIZATION     TUBAL LIGATION      FAMILY HISTORY: Family History  Problem Relation Age of Onset   Lung cancer Mother    Diabetes Mother    Coronary artery disease Father    Hypertension Sister    Diabetes Brother    Diabetes Maternal Grandmother  Colon cancer Neg Hx    Colon polyps Neg Hx    Esophageal cancer Neg Hx    Rectal cancer Neg Hx    Stomach cancer Neg Hx     SOCIAL HISTORY: Social History   Socioeconomic History   Marital status: Married    Spouse name: Not on file   Number of children: Not on file   Years of education: Not on file   Highest education level: Not on file  Occupational History   Not on file  Tobacco Use   Smoking status: Never   Smokeless tobacco: Never  Substance and Sexual Activity   Alcohol use: Yes    Alcohol/week: 0.0 standard drinks of alcohol    Comment: rare   Drug use: No   Sexual activity: Not on file  Other Topics Concern   Not on file  Social History Narrative   Not on file   Social Determinants of Health   Financial Resource Strain: Not on file  Food Insecurity: No Food Insecurity (11/11/2021)   Hunger Vital Sign    Worried About Running Out of Food in the Last Year: Never true    Ran Out of Food in the Last Year: Never true  Transportation Needs: No Transportation Needs (11/17/2021)   PRAPARE - Administrator, Civil Service (Medical): No    Lack of Transportation (Non-Medical): No  Physical Activity: Not on file  Stress: Not on file  Social Connections: Unknown (08/05/2022)   Received from  [facility]   Social Network    Social Network: Not on file  Intimate Partner Violence: Unknown (08/05/2022)   Received from [facility]   HITS    Physically Hurt: Not on file    Insult or Talk Down To: Not on file    Threaten Physical Harm: Not on file    Scream or Curse: Not on file    PHYSICAL EXAM  GENERAL EXAM/CONSTITUTIONAL: Vitals:  Vitals:   01/11/23 0957  BP: (!) 156/69  Pulse: 75  Weight: 231 lb (104.8 kg)  Height: 5\' 6"  (1.676 m)   Body mass index is 37.28 kg/m. Wt Readings from Last 3 Encounters:  01/11/23 231 lb (104.8 kg)  04/28/22 215 lb (97.5 kg)  04/14/22 215 lb (97.5 kg)   Patient is in no distress; well developed, nourished and groomed; neck is supple  MUSCULOSKELETAL: Gait, strength, tone, movements noted in Neurologic exam below  NEUROLOGIC: MENTAL STATUS:      No data to display             01/11/2023   10:01 AM 01/06/2022   10:36 AM  Montreal Cognitive Assessment   Visuospatial/ Executive (0/5) 3 3  Naming (0/3) 2 3  Attention: Read list of digits (0/2) 2 2  Attention: Read list of letters (0/1) 1 1  Attention: Serial 7 subtraction starting at 100 (0/3) 1 1  Language: Repeat phrase (0/2) 1 2  Language : Fluency (0/1) 1 1  Abstraction (0/2) 2 2  Delayed Recall (0/5) 1 3  Orientation (0/6) 4 5  Total 18 23     CRANIAL NERVE:  2nd, 3rd, 4th, 6th - Visual fields full to confrontation, extraocular muscles intact, no nystagmus 5th - facial sensation symmetric 7th - facial strength symmetric 8th - hearing intact 9th - palate elevates symmetrically, uvula midline 11th - shoulder shrug symmetric 12th - tongue protrusion midline  MOTOR:  normal bulk and tone, full strength in the BUE, BLE  SENSORY:  normal and symmetric to light touch  COORDINATION:  finger-nose-finger, fine finger movements normal  GAIT/STATION:  Wide base, wobbly    DIAGNOSTIC DATA (LABS, IMAGING, TESTING) - I reviewed patient records, labs, notes,  testing and imaging myself where available.  Lab Results  Component Value Date   WBC 7.2 11/16/2021   HGB 10.7 (L) 11/16/2021   HCT 32.0 (L) 11/16/2021   MCV 85.3 11/16/2021   PLT 242 11/16/2021      Component Value Date/Time   NA 137 11/16/2021 0535   K 3.7 11/16/2021 0535   CL 98 11/16/2021 0535   CO2 25 11/16/2021 0535   GLUCOSE 217 (H) 11/16/2021 0535   BUN 18 11/16/2021 0535   CREATININE 1.00 11/16/2021 0535   CALCIUM 9.5 11/16/2021 0535   PROT 6.2 (L) 11/16/2021 0535   ALBUMIN 2.9 (L) 11/16/2021 0535   AST 29 11/16/2021 0535   ALT 29 11/16/2021 0535   ALKPHOS 56 11/16/2021 0535   BILITOT 0.7 11/16/2021 0535   GFRNONAA >60 11/16/2021 0535   GFRAA >60 09/03/2015 0347   Lab Results  Component Value Date   TRIG 124 11/15/2021   Lab Results  Component Value Date   HGBA1C 7.4 (H) 11/11/2021   Lab Results  Component Value Date   VITAMINB12 640 11/16/2021   Lab Results  Component Value Date   TSH 2.434 11/11/2021    MRI Brain 10/01/21 No evidence of acute intracranial abnormality. 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 Moderate chronic small vessel ischemic changes within the cerebral white matter and pons. Chronic lacunar infarct within the right thalamus. Small chronic infarcts within the left cerebellar hemisphere.   CTA Head and Neck 11/11/21 1. No emergent finding including large vessel occlusion. 2. Atherosclerosis without flow limiting stenosis or ulceration of major vessels in the head and neck.   LTM EEG 11/11/21 -Intermittent slow, generalized -Background attenuation, generalized   ASSESSMENT AND PLAN  72 y.o. year old female with  hypertension, hyperlipidemia, diabetes mellitus, obstructive sleep apnea on CPAP, AMS in the setting of DKA who is presenting for follow up.  Doing better since last visit, has not had any additional episode of altered mental status.  Her main  complaint today he is a memory, more forgetful than usual and having word finding difficulty.  She scored a 18/30 on the Moca which is worse than last time 23/30.  Patient likely has mild cognitive impairment.  She continued to be independent in all ADLs.  Will refer her to physical therapy since she has gait impairment.  Advised her to continue following up with PCP, to increase her exercise, at least 20 minutes daily 5 days a week and to return as needed.  She voiced understanding.   1. Mild cognitive impairment   2. Abnormal gait      Patient Instructions  Continue current medications Continue to follow with PCP Refer to physical therapy for gait training Return as needed.  There are well-accepted and sensible ways to reduce risk for Alzheimers disease and other degenerative brain disorders .  Exercise Daily Walk A daily 20 minute walk should be part of your routine. Disease related apathy can be a significant roadblock to exercise and the only way to overcome this is to make it a daily routine and perhaps have a reward at the end (something your loved one loves to eat or drink perhaps) or a personal trainer coming to the home can also be  very useful. Most importantly, the patient is much more likely to exercise if the caregiver / spouse does it with him/her. In general a structured, repetitive schedule is best.  General Health: Any diseases which effect your body will effect your brain such as a pneumonia, urinary infection, blood clot, heart attack or stroke. Keep contact with your primary care doctor for regular follow ups.  Sleep. A good nights sleep is healthy for the brain. Seven hours is recommended. If you have insomnia or poor sleep habits we can give you some instructions. If you have sleep apnea wear your mask.  Diet: Eating a heart healthy diet is also a good idea; fish and poultry instead of red meat, nuts (mostly non-peanuts), vegetables, fruits, olive oil or canola oil  (instead of butter), minimal salt (use other spices to flavor foods), whole grain rice, bread, cereal and pasta and wine in moderation.Research is now showing that the MIND diet, which is a combination of The Mediterranean diet and the DASH diet, is beneficial for cognitive processing and longevity. Information about this diet can be found in The MIND Diet, a book by Alonna Minium, MS, RDN, and online at [employer]  Finances, Power of [phone] and Advance Directives: You should consider putting legal safeguards in place with regard to financial and medical decision making. While the spouse always has power of attorney for medical and financial issues in the absence of any form, you should consider what you want in case the spouse / caregiver is no longer around or capable of making decisions.     Orders Placed This Encounter  Procedures   Ambulatory referral to Physical Therapy    No orders of the defined types were placed in this encounter.   Return if symptoms worsen or fail to improve.   Windell Norfolk, MD 01/11/2023, 11:48 AM  [facility] [address], [address] [place], [place] [address] [phone]

## 2023-01-18 NOTE — Therapy (Signed)
OUTPATIENT PHYSICAL THERAPY NEURO EVALUATION   Patient Name: Victoria Jimenez MRN: [id number] DOB:10-16-1950, 72 y.o., female Today's Date: 01/18/2023   PCP: Ellyn Hack, MD  REFERRING PROVIDER: Windell Norfolk, MD  END OF SESSION:   Past Medical History:  Diagnosis Date   Allergic rhinitis    Allergy    Anxiety    Coronary artery disease    Treated by Angioplasty   Diabetes mellitus without complication (HCC)    Type II   GERD (gastroesophageal reflux disease)    Herpes simplex    Hyperlipidemia    Hypertension    OSA (obstructive sleep apnea)    on CPAP   Sleep apnea    wears cpap nightly   Past Surgical History:  Procedure Laterality Date   ABDOMINAL HYSTERECTOMY     ANGIOPLASTY     CARDIAC CATHETERIZATION     TUBAL LIGATION     Patient Active Problem List   Diagnosis Date Noted   AKI (acute kidney injury) (HCC) 11/15/2021   Altered mental status    Acute metabolic encephalopathy 11/11/2021   Hypertensive emergency    Acute respiratory failure with hypoxia (HCC)    Hyperglycemia    Diabetic ketoacidosis without coma associated with type 2 diabetes mellitus (HCC)    Sepsis (HCC) 09/01/2015   Lower urinary tract infectious disease 09/01/2015   GERD (gastroesophageal reflux disease) 09/01/2015   Vaginal yeast infection 09/01/2015   Piriformis syndrome of left side 09/01/2015   Back pain 06/18/2014   Normal coronary arteries 2007 05/08/2014   Exertional dyspnea 05/08/2014   Chest pain with moderate risk of acute coronary syndrome 05/08/2014   PVD (peripheral vascular disease) (HCC) 05/08/2014   Obstructive sleep apnea 01/25/2013   Obesity (BMI 30-39.9) 01/25/2013   Hypertension    Insulin dependent diabetes mellitus (HCC)     ONSET DATE: 01/11/2023  REFERRING DIAG: R26.9 (ICD-10-CM) - Abnormal gait  THERAPY DIAG:  No diagnosis found.  Rationale for Evaluation and Treatment: Rehabilitation  SUBJECTIVE:                                                                                                                                                                                              SUBJECTIVE STATEMENT: *** Pt accompanied by: {accompnied:27141}  PERTINENT HISTORY: PMH: hypertension, hyperlipidemia, diabetes mellitus, obstructive sleep sleep apnea on CPAP, memory loss   PAIN:  Are you having pain? {OPRCPAIN:27236}  PRECAUTIONS: {Therapy precautions:24002}  RED FLAGS: {PT Red Flags:29287}   WEIGHT BEARING RESTRICTIONS: {Yes ***/No:24003}  FALLS: Has patient fallen in last 6 months? {fallsyesno:27318}  LIVING ENVIRONMENT: Lives with: {OPRC lives with:25569::"lives  with their family"} Lives in: {Lives in:25570} Stairs: {opstairs:27293} Has following equipment at home: {Assistive devices:23999}  PLOF: {PLOF:24004}  PATIENT GOALS: ***  OBJECTIVE:  Note: Objective measures were completed at Evaluation unless otherwise noted.  DIAGNOSTIC FINDINGS: MRI Brain 10/01/21 No evidence of acute intracranial abnormality. 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 Moderate chronic small vessel ischemic changes within the cerebral white matter and pons. Chronic lacunar infarct within the right thalamus. Small chronic infarcts within the left cerebellar hemisphere.  COGNITION: Overall cognitive status: {cognition:24006}   SENSATION: {sensation:27233}  COORDINATION: ***  EDEMA:  {edema:24020}  MUSCLE TONE: {LE tone:25568}  MUSCLE LENGTH: Hamstrings: Right *** deg; Left *** deg Maisie Fus test: Right *** deg; Left *** deg  DTRs:  {DTR SITE:24025}  POSTURE: {posture:25561}  LOWER EXTREMITY ROM:     {AROM/PROM:27142}  Right Eval Left Eval  Hip flexion    Hip extension    Hip abduction    Hip adduction    Hip internal rotation    Hip external rotation    Knee flexion    Knee extension    Ankle dorsiflexion    Ankle plantarflexion     Ankle inversion    Ankle eversion     (Blank rows = not tested)  LOWER EXTREMITY MMT:    MMT Right Eval Left Eval  Hip flexion    Hip extension    Hip abduction    Hip adduction    Hip internal rotation    Hip external rotation    Knee flexion    Knee extension    Ankle dorsiflexion    Ankle plantarflexion    Ankle inversion    Ankle eversion    (Blank rows = not tested)  BED MOBILITY:  {Bed mobility:24027}  TRANSFERS: Assistive device utilized: {Assistive devices:23999}  Sit to stand: {Levels of assistance:24026} Stand to sit: {Levels of assistance:24026} Chair to chair: {Levels of assistance:24026} Floor: {Levels of assistance:24026}  RAMP:  Level of Assistance: {Levels of assistance:24026} Assistive device utilized: {Assistive devices:23999} Ramp Comments: ***  CURB:  Level of Assistance: {Levels of assistance:24026} Assistive device utilized: {Assistive devices:23999} Curb Comments: ***  STAIRS: Level of Assistance: {Levels of assistance:24026} Stair Negotiation Technique: {Stair Technique:27161} with {Rail Assistance:27162} Number of Stairs: ***  Height of Stairs: ***  Comments: ***  GAIT: Gait pattern: {gait characteristics:25376} Distance walked: *** Assistive device utilized: {Assistive devices:23999} Level of assistance: {Levels of assistance:24026} Comments: ***  FUNCTIONAL TESTS:  {Functional tests:24029}  PATIENT SURVEYS:  {rehab surveys:24030}  TODAY'S TREATMENT:                                                                                                                              DATE: ***    PATIENT EDUCATION: Education details: *** Person educated: {Person educated:25204} Education method: {Education Method:25205} Education comprehension: {Education Comprehension:25206}  HOME EXERCISE PROGRAM: ***  GOALS: Goals reviewed with patient? {yes/no:20286}  SHORT TERM  GOALS: Target date: ***  *** Baseline: Goal status:  INITIAL  2.  *** Baseline:  Goal status: INITIAL  3.  *** Baseline:  Goal status: INITIAL  4.  *** Baseline:  Goal status: INITIAL  5.  *** Baseline:  Goal status: INITIAL  6.  *** Baseline:  Goal status: INITIAL  LONG TERM GOALS: Target date: ***  *** Baseline:  Goal status: INITIAL  2.  *** Baseline:  Goal status: INITIAL  3.  *** Baseline:  Goal status: INITIAL  4.  *** Baseline:  Goal status: INITIAL  5.  *** Baseline:  Goal status: INITIAL  6.  *** Baseline:  Goal status: INITIAL  ASSESSMENT:  CLINICAL IMPRESSION: Patient is a *** y.o. *** who was seen today for physical therapy evaluation and treatment for ***.   OBJECTIVE IMPAIRMENTS: {opptimpairments:25111}.   ACTIVITY LIMITATIONS: {activitylimitations:27494}  PARTICIPATION LIMITATIONS: {participationrestrictions:25113}  PERSONAL FACTORS: {Personal factors:25162} are also affecting patient's functional outcome.   REHAB POTENTIAL: {rehabpotential:25112}  CLINICAL DECISION MAKING: {clinical decision making:25114}  EVALUATION COMPLEXITY: {Evaluation complexity:25115}  PLAN:  PT FREQUENCY: {rehab frequency:25116}  PT DURATION: {rehab duration:25117}  PLANNED INTERVENTIONS: {rehab planned interventions:25118::"97110-Therapeutic exercises","97530- Therapeutic [phone]- Neuromuscular re-education","97535- Self UYQI","34742- Manual therapy"}  PLAN FOR NEXT SESSION: ***   Marithza Malachi N Avaleigh Decuir, PT,DPT 01/18/2023, 11:59 AM

## 2023-01-19 ENCOUNTER — Ambulatory Visit: Payer: Medicare Other | Attending: Neurology | Admitting: Physical Therapy

## 2023-01-19 VITALS — BP 136/74 | HR 77

## 2023-01-19 DIAGNOSIS — R2681 Unsteadiness on feet: Secondary | ICD-10-CM | POA: Diagnosis present

## 2023-01-19 DIAGNOSIS — R2689 Other abnormalities of gait and mobility: Secondary | ICD-10-CM | POA: Diagnosis present

## 2023-01-19 DIAGNOSIS — R269 Unspecified abnormalities of gait and mobility: Secondary | ICD-10-CM | POA: Insufficient documentation

## 2023-01-19 DIAGNOSIS — M6281 Muscle weakness (generalized): Secondary | ICD-10-CM | POA: Diagnosis present

## 2023-02-02 ENCOUNTER — Ambulatory Visit: Payer: Medicare Other | Admitting: Physical Therapy

## 2023-02-09 ENCOUNTER — Encounter: Payer: Self-pay | Admitting: Physical Therapy

## 2023-02-09 ENCOUNTER — Ambulatory Visit: Payer: Medicare Other | Admitting: Physical Therapy

## 2023-02-09 DIAGNOSIS — R2689 Other abnormalities of gait and mobility: Secondary | ICD-10-CM

## 2023-02-09 DIAGNOSIS — M6281 Muscle weakness (generalized): Secondary | ICD-10-CM

## 2023-02-09 DIAGNOSIS — R2681 Unsteadiness on feet: Secondary | ICD-10-CM

## 2023-02-09 NOTE — Patient Instructions (Addendum)
 You Can Walk For A Certain Length Of Time Each Day                          Walk 2-3 minutes 3 times per day.             Increase 1-2  minutes every week             Work up to 20 minutes (1 time per day).               Example:                         Day 1-2           4-5 minutes     3 times per day                         Day 7-8           10-12 minutes 2-3 times per day                         Day 13-14       20-22 minutes 1-2 times per day  Access Code: [email or web address] URL: [email or web address] Date: 02/09/2023 Prepared by: Daved Bull  Exercises - Sit to Stand  - 1 x daily - 5 x weekly - 2-3 sets - 10 reps - Standing March with Counter Support  - 1 x daily - 5 x weekly - 2 sets - 14-20 reps - Standing Hip Extension with Leg Bent and Support  - 1 x daily - 5 x weekly - 1-2 sets - 10 reps

## 2023-02-09 NOTE — Therapy (Signed)
 OUTPATIENT PHYSICAL THERAPY NEURO TREATMENT   Patient Name: Victoria Jimenez MRN: [id number] DOB:[birth year], 72 y.o., female Today's Date: 02/09/2023   PCP: Maree Leni Edyth DELENA, MD  REFERRING PROVIDER: Gregg Lek, MD  END OF SESSION:  PT End of Session - 02/09/23 1159     Visit Number 2    Number of Visits 7    Date for PT Re-Evaluation 03/20/23   due to delay in scheduling   Authorization Type [employer]    PT Start Time 1151   PT with pt prior   PT Stop Time 1225    PT Time Calculation (min) 34 min    Equipment Utilized During Treatment Gait belt    Activity Tolerance Patient tolerated treatment well;Patient limited by fatigue    Behavior During Therapy WFL for tasks assessed/performed             Past Medical History:  Diagnosis Date   Allergic rhinitis    Allergy    Anxiety    Coronary artery disease    Treated by Angioplasty   Diabetes mellitus without complication (HCC)    Type II   GERD (gastroesophageal reflux disease)    Herpes simplex    Hyperlipidemia    Hypertension    OSA (obstructive sleep apnea)    on CPAP   Sleep apnea    wears cpap nightly   Past Surgical History:  Procedure Laterality Date   ABDOMINAL HYSTERECTOMY     ANGIOPLASTY     CARDIAC CATHETERIZATION     TUBAL LIGATION     Patient Active Problem List   Diagnosis Date Noted   AKI (acute kidney injury) (HCC) 11/15/2021   Altered mental status    Acute metabolic encephalopathy 11/11/2021   Hypertensive emergency    Acute respiratory failure with hypoxia (HCC)    Hyperglycemia    Diabetic ketoacidosis without coma associated with type 2 diabetes mellitus (HCC)    Sepsis (HCC) 09/01/2015   Lower urinary tract infectious disease 09/01/2015   GERD (gastroesophageal reflux disease) 09/01/2015   Vaginal yeast infection 09/01/2015   Piriformis syndrome of left side 09/01/2015   Back pain 06/18/2014   Normal coronary arteries 2007 05/08/2014   Exertional  dyspnea 05/08/2014   Chest pain with moderate risk of acute coronary syndrome 05/08/2014   PVD (peripheral vascular disease) (HCC) 05/08/2014   Obstructive sleep apnea 01/25/2013   Obesity (BMI 30-39.9) 01/25/2013   Hypertension    Insulin  dependent diabetes mellitus (HCC)     ONSET DATE: 01/11/2023  REFERRING DIAG: R26.9 (ICD-10-CM) - Abnormal gait  THERAPY DIAG:  Other abnormalities of gait and mobility  Muscle weakness (generalized)  Unsteadiness on feet  Rationale for Evaluation and Treatment: Rehabilitation  SUBJECTIVE:  SUBJECTIVE STATEMENT: She reports right thigh stiffness worse than left, states this reminds her of her sciatic problems coming back.  She denies falls and pain at current.  She states she attempted to make her MD aware of stopping her cholesterol medicine due to headaches, but no one called her back (PT encouraged re-calling).  Pt accompanied by: self  PERTINENT HISTORY: PMH: hypertension, hyperlipidemia, diabetes mellitus, obstructive sleep sleep apnea on CPAP, memory loss (per Dr. Gregg, mild cognitive impairment)  PAIN:  Are you having pain? When she goes to stand, knees are bothersome.   There were no vitals filed for this visit.    PRECAUTIONS: None  WEIGHT BEARING RESTRICTIONS: No  FALLS: Has patient fallen in last 6 months? No  LIVING ENVIRONMENT: Lives with: lives with their spouse and lives with their daughter Lives in: House/apartment Stairs: 1 story house, ramped entrance in the back, does not need to do stairs.  Has following equipment at home: Single point cane, Walker - 4 wheeled, shower chair, Grab bars, and Ramped entry  PLOF: Independent Can't bend down to get stuff, has a reacher but it is not good. Does not have a strong reacher.    PATIENT GOALS: Wants to be able to walk better   OBJECTIVE:  Note: Objective measures were completed at Evaluation unless otherwise noted.  DIAGNOSTIC FINDINGS: MRI Brain 10/01/21 No evidence of acute intracranial abnormality. 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 Moderate chronic small vessel ischemic changes within the cerebral white matter and pons. Chronic lacunar infarct within the right thalamus. Small chronic infarcts within the left cerebellar hemisphere.  COGNITION: Overall cognitive status: Impaired Mild cognitive impairment per Dr. Gregg    SENSATION: Sharie touch: WFL Reports both feet are always cold  COORDINATION: Heel to shin: Limited due to weakness    POSTURE: rounded shoulders, forward head, and posterior pelvic tilt   LOWER EXTREMITY MMT:    MMT Right Eval Left Eval  Hip flexion 4 4+  Hip extension    Hip abduction 4+ 4+  Hip adduction 5 5  Hip internal rotation    Hip external rotation    Knee flexion 5 5  Knee extension 5 4+  Ankle dorsiflexion 5 4+  Ankle plantarflexion    Ankle inversion    Ankle eversion    (Blank rows = not tested)  All tested in sitting   BED MOBILITY:  Pt reports she has a chair next to bed, has to pull up on it to get up.   TRANSFERS: Assistive device utilized: None  Sit to stand: SBA Stand to sit: SBA No UE support, performs with wide BOS and decr eccentric control    STAIRS: Level of Assistance: SBA Stair Negotiation Technique: Step to Pattern Alternating Pattern  with Bilateral Rails Number of Stairs: 4  Height of Stairs: 6   Comments: Pt relies on BUE when going up the stairs   GAIT: Gait pattern: step through pattern, decreased stride length, decreased hip/knee flexion- Right, decreased hip/knee flexion- Left, Right foot flat, Left foot flat, and wide BOS Distance walked: Clinic distances  Assistive device utilized:  None Level of assistance: SBA Comments: Pt fatigued after gait and reporting pain in knees, needing to sit down   FUNCTIONAL TESTS:  5 times sit to stand: 23.1 seconds  30 seconds chair stand test: 6 sit <> stands, pt reporting RPE as 8/10 10 meter walk test: 13.47 seconds = 2.43 ft/sec  TODAY'S TREATMENT:   02/09/2023: - :  310 ft IND                                                                                                                         -STS x10 w/ hands on knees -Standing march at counter 2x20, cued to prevent hamstring curl -Standing kickbacks 2x10 each LE, cued to maintain upright  You Can Walk For A Certain Length Of Time Each Day                          Walk 2-3 minutes 3 times per day.             Increase 1-2  minutes every week             Work up to 20 minutes (1 time per day).               Example:                         Day 1-2           4-5 minutes     3 times per day                         Day 7-8           10-12 minutes 2-3 times per day                         Day 13-14       20-22 minutes 1-2 times per day  Pt declines SciFit due to fatigue.  PATIENT EDUCATION: Education details: Initial HEP and walking program - answered several clarifying questions.  Education on utilizing standing vs sitting rest when she can to improve tolerance to standing. Person educated: Patient Education method: Explanation Education comprehension: verbalized understanding  HOME EXERCISE PROGRAM: You Can Walk For A Certain Length Of Time Each Day                          Walk 2-3 minutes 3 times per day.             Increase 1-2  minutes every week             Work up to 20 minutes (1 time per day).               Example:                         Day 1-2           4-5 minutes     3 times per day                         Day 7-8  10-12 minutes 2-3 times per day                         Day 13-14       20-22 minutes 1-2 times per day  Access  Code: [email or web address] URL: [email or web address] Date: 02/09/2023 Prepared by: Daved Bull  Exercises - Sit to Stand  - 1 x daily - 5 x weekly - 2-3 sets - 10 reps - Standing March with Counter Support  - 1 x daily - 5 x weekly - 2 sets - 14-20 reps - Standing Hip Extension with Leg Bent and Support  - 1 x daily - 5 x weekly - 1-2 sets - 10 reps  GOALS: Goals reviewed with patient? Yes  SHORT TERM GOALS: Target date: 02/09/2023  Pt will be independent with initial HEP in order to build upon functional gains made in therapy. Baseline: Goal status: INITIAL  2.  to be assessed with LTG written.  Baseline: 310 ft IND (12/31) Goal status: MET  3.  Pt will improve 5x sit<>stand to less than or equal to 20 sec to demonstrate improved functional strength and transfer efficiency.   Baseline: 23.1 seconds with no UE support  Goal status: INITIAL    LONG TERM GOALS: Target date: 03/09/2023   Pt will be independent with final HEP in order to build upon functional gains made in therapy. Baseline:  Goal status: INITIAL  2.  Pt will ambulate>/= 410 feet on to demonstrate improved endurance for functional tasks in home and community. Baseline: 310 ft IND (12/31) Goal status: INITIAL  3.  Pt will improve gait speed with no AD to at least 2.7 ft/sec in order to demo improved community mobility.   Baseline: 13.47 seconds = 2.43 ft/sec Goal status: INITIAL  4.  Pt will improve 30 second chair stand to at least 8 sit <> stands in order to demo improved strength/endurance  Baseline:  6 sit <> stands, pt reporting RPE as 8/10 Goal status: INITIAL  5.  Pt will improve FGA to at least a 22/30 in order to demo decr fall risk.  Baseline: 19/30 Goal status: INITIAL   ASSESSMENT:  CLINICAL IMPRESSION: Focus of skilled session on establishing introductory walking program and HEP.  Pt is severely limited by decrease activity tolerance requiring frequent rest and  being unable to complete session duration following brief activity.  She would benefit from further skilled PT to address these deficits and optimize functional independence.   OBJECTIVE IMPAIRMENTS: Abnormal gait, cardiopulmonary status limiting activity, decreased activity tolerance, decreased balance, decreased cognition, decreased coordination, decreased endurance, decreased knowledge of use of DME, decreased mobility, difficulty walking, decreased strength, impaired flexibility, postural dysfunction, and pain.   ACTIVITY LIMITATIONS: carrying, bending, standing, stairs, transfers, bed mobility, and locomotion level  PARTICIPATION LIMITATIONS: shopping and community activity  PERSONAL FACTORS: Age, Behavior pattern, Past/current experiences, Time since onset of injury/illness/exacerbation, 3+ comorbidities:  hypertension, hyperlipidemia, diabetes mellitus, obstructive sleep sleep apnea on CPAP, memory loss (per Dr. Gregg, mild cognitive impairment), and sedentary lifestyle, pt only able to do 1x a week due to finances   are also affecting patient's functional outcome.   REHAB POTENTIAL: Good  CLINICAL DECISION MAKING: Evolving/moderate complexity  EVALUATION COMPLEXITY: Moderate   PLAN:  PT FREQUENCY: 1x/week -pt unable to do 2x a week due to finances   PT DURATION: 8 weeks  PLANNED INTERVENTIONS: [id number]- PT Re-evaluation, 97110-Therapeutic exercises, 97530- Therapeutic activity, [id number]- Neuromuscular re-education, 97535- Self  Care, [id number]- Manual therapy, [phone]- Gait training, Patient/Family education, Balance training, Stair training, and DME instructions  PLAN FOR NEXT SESSION:  Initial HEP for functional strength, walking program, sit to stands. Work on functional strength, balance, and SciFit. Pt with pretty decr endurance.  ASSESS REMAINING STGs!   Daved KATHEE Bull, PT, DPT 02/09/2023, 1:02 PM

## 2023-02-16 ENCOUNTER — Ambulatory Visit: Payer: Medicare Other | Attending: Neurology | Admitting: Physical Therapy

## 2023-02-23 ENCOUNTER — Ambulatory Visit: Payer: Medicare Other | Admitting: Physical Therapy

## 2023-02-23 ENCOUNTER — Encounter: Payer: Self-pay | Admitting: Physical Therapy

## 2023-02-23 NOTE — Therapy (Signed)
 [facility] [address] [place], [place], [address] Phone: [phone]   Fax:  [phone]  Patient Details  Name: Victoria Jimenez MRN: [id number] Date of Birth: 05-15-1950 Referring Provider:  No ref. provider found  Encounter Date: 02/23/2023  PHYSICAL THERAPY DISCHARGE SUMMARY  Visits from Start of Care: 2  Current functional level related to goals / functional outcomes: Unable to check goals due to pt only coming for one visit.    Remaining deficits: decr strength, decr activity tolerance/endurance, impaired coordination, posture abnormalities, gait abnormalities, difficulties with functional transfers, impaired balance   Education / Equipment: HEP   Patient agrees to discharge. Patient goals were not met. Patient is being discharged due to the patient's request.  Called pt in regards to no show appt on 02/23/23. Pt requesting to be discharged and have remainder of appts cancelled.    [place] LOISE Senate, PT, DPT 02/23/2023, 10:33 AM  [facility] [address] [place], [place], [address] Phone: [phone]   Fax:  [phone]

## 2023-03-02 ENCOUNTER — Ambulatory Visit: Payer: Medicare Other | Admitting: Physical Therapy

## 2023-03-09 ENCOUNTER — Ambulatory Visit: Payer: Medicare Other | Admitting: Physical Therapy

## 2024-03-17 ENCOUNTER — Encounter: Payer: Self-pay | Admitting: *Deleted

## 2024-03-17 LAB — POCT ABI - SCREENING FOR PILOT NO CHARGE
Left ABI: 1.11
Right ABI: 1.09

## 2024-03-17 LAB — GLUCOSE, POCT (MANUAL RESULT ENTRY): Glucose Fasting, POC: 125 mg/dL — AB (ref 70–99)

## 2024-03-17 NOTE — Progress Notes (Signed)
 Patient attended a screening event on 03/17/24 where her BP was 167/80 and blood glucose was 125. Patients right ABI was 1.09 and left ABI was 1.11. Patient has a PCP, has insurance, and does not smoke. Food SDOH need indicated.
